# Patient Record
Sex: Female | Born: 1950 | Race: White | Hispanic: No | Marital: Married | Smoking: Former smoker
Health system: Southern US, Community
[De-identification: ages and names within clinical notes are randomized; demographics above are authoritative.]

## PROBLEM LIST (undated history)

## (undated) DIAGNOSIS — M1990 Unspecified osteoarthritis, unspecified site: Secondary | ICD-10-CM

## (undated) DIAGNOSIS — I214 Non-ST elevation (NSTEMI) myocardial infarction: Secondary | ICD-10-CM

## (undated) DIAGNOSIS — Z8619 Personal history of other infectious and parasitic diseases: Secondary | ICD-10-CM

## (undated) DIAGNOSIS — I951 Orthostatic hypotension: Secondary | ICD-10-CM

## (undated) DIAGNOSIS — I5181 Takotsubo syndrome: Secondary | ICD-10-CM

## (undated) DIAGNOSIS — A310 Pulmonary mycobacterial infection: Secondary | ICD-10-CM

## (undated) DIAGNOSIS — R768 Other specified abnormal immunological findings in serum: Secondary | ICD-10-CM

## (undated) DIAGNOSIS — J0181 Other acute recurrent sinusitis: Secondary | ICD-10-CM

## (undated) DIAGNOSIS — R55 Syncope and collapse: Secondary | ICD-10-CM

## (undated) DIAGNOSIS — M8580 Other specified disorders of bone density and structure, unspecified site: Secondary | ICD-10-CM

## (undated) DIAGNOSIS — I252 Old myocardial infarction: Secondary | ICD-10-CM

## (undated) DIAGNOSIS — H9190 Unspecified hearing loss, unspecified ear: Secondary | ICD-10-CM

## (undated) DIAGNOSIS — R05 Cough: Secondary | ICD-10-CM

## (undated) DIAGNOSIS — N390 Urinary tract infection, site not specified: Secondary | ICD-10-CM

## (undated) DIAGNOSIS — M069 Rheumatoid arthritis, unspecified: Secondary | ICD-10-CM

## (undated) DIAGNOSIS — I493 Ventricular premature depolarization: Secondary | ICD-10-CM

## (undated) DIAGNOSIS — I509 Heart failure, unspecified: Secondary | ICD-10-CM

## (undated) DIAGNOSIS — J439 Emphysema, unspecified: Secondary | ICD-10-CM

## (undated) DIAGNOSIS — I10 Essential (primary) hypertension: Secondary | ICD-10-CM

## (undated) DIAGNOSIS — J479 Bronchiectasis, uncomplicated: Secondary | ICD-10-CM

## (undated) DIAGNOSIS — R0982 Postnasal drip: Secondary | ICD-10-CM

## (undated) DIAGNOSIS — R918 Other nonspecific abnormal finding of lung field: Secondary | ICD-10-CM

## (undated) DIAGNOSIS — F419 Anxiety disorder, unspecified: Secondary | ICD-10-CM

## (undated) HISTORY — DX: Takotsubo syndrome: I51.81

## (undated) HISTORY — DX: Syncope and collapse: R55

## (undated) HISTORY — DX: Orthostatic hypotension: I95.1

## (undated) HISTORY — DX: Other specified disorders of bone density and structure, unspecified site: M85.80

## (undated) HISTORY — PX: OTHER SURGICAL HISTORY: SHX169

## (undated) HISTORY — DX: Bronchiectasis, uncomplicated: J47.9

## (undated) HISTORY — DX: Unspecified osteoarthritis, unspecified site: M19.90

## (undated) HISTORY — DX: Anxiety disorder, unspecified: F41.9

## (undated) HISTORY — DX: Cough: R05

## (undated) HISTORY — PX: LUMBAR SPINE SURGERY: SHX701

## (undated) HISTORY — DX: Rheumatoid arthritis, unspecified: M06.9

## (undated) HISTORY — DX: Unspecified hearing loss, unspecified ear: H91.90

## (undated) HISTORY — PX: TONSILLECTOMY AND ADENOIDECTOMY: SUR1326

## (undated) HISTORY — DX: Other specified abnormal immunological findings in serum: R76.8

## (undated) HISTORY — DX: Old myocardial infarction: I25.2

## (undated) HISTORY — DX: Heart failure, unspecified: I50.9

## (undated) HISTORY — DX: Other acute recurrent sinusitis: J01.81

## (undated) HISTORY — DX: Non-ST elevation (NSTEMI) myocardial infarction: I21.4

## (undated) HISTORY — DX: Urinary tract infection, site not specified: N39.0

## (undated) HISTORY — DX: Postnasal drip: R09.82

## (undated) HISTORY — DX: Ventricular premature depolarization: I49.3

## (undated) HISTORY — DX: Pulmonary mycobacterial infection: A31.0

## (undated) HISTORY — DX: Other nonspecific abnormal finding of lung field: R91.8

## (undated) HISTORY — DX: Essential (primary) hypertension: I10

## (undated) HISTORY — DX: Personal history of other infectious and parasitic diseases: Z86.19

## (undated) HISTORY — PX: CARDIAC CATHETERIZATION: SHX172

## (undated) HISTORY — DX: Emphysema, unspecified: J43.9

---

## 2003-03-30 ENCOUNTER — Ambulatory Visit (HOSPITAL_COMMUNITY): Admission: RE | Admit: 2003-03-30 | Discharge: 2003-03-31 | Payer: Self-pay | Admitting: Neurosurgery

## 2014-02-26 DIAGNOSIS — I214 Non-ST elevation (NSTEMI) myocardial infarction: Secondary | ICD-10-CM

## 2014-02-26 HISTORY — DX: Non-ST elevation (NSTEMI) myocardial infarction: I21.4

## 2014-03-08 ENCOUNTER — Telehealth: Payer: Self-pay | Admitting: Critical Care Medicine

## 2014-03-08 NOTE — Telephone Encounter (Signed)
Spoke with the pt and rescheduled her consult for the [place] office 04/10/14 at 9 am  Pt aware to bring meds and arrive @ 8:45 am  Pt aware of [place] office location

## 2014-03-08 NOTE — Telephone Encounter (Signed)
Crystal and PW please advise if you are willing to see this patient in [place] office sooner instead of patient having to go [place] office later. Pt is aware that I am having to get approval first. Thanks.

## 2014-03-08 NOTE — Telephone Encounter (Signed)
Ok to work in either [employer] office or [place] sooner

## 2014-04-07 ENCOUNTER — Encounter: Payer: Self-pay | Admitting: Critical Care Medicine

## 2014-04-10 ENCOUNTER — Encounter: Payer: Self-pay | Admitting: Critical Care Medicine

## 2014-04-10 ENCOUNTER — Ambulatory Visit (INDEPENDENT_AMBULATORY_CARE_PROVIDER_SITE_OTHER): Payer: PRIVATE HEALTH INSURANCE | Admitting: Critical Care Medicine

## 2014-04-10 VITALS — BP 128/76 | HR 76 | Temp 97.1°F | Ht 64.0 in | Wt 107.6 lb

## 2014-04-10 DIAGNOSIS — I10 Essential (primary) hypertension: Secondary | ICD-10-CM

## 2014-04-10 DIAGNOSIS — I5181 Takotsubo syndrome: Secondary | ICD-10-CM

## 2014-04-10 DIAGNOSIS — M069 Rheumatoid arthritis, unspecified: Secondary | ICD-10-CM

## 2014-04-10 DIAGNOSIS — I252 Old myocardial infarction: Secondary | ICD-10-CM

## 2014-04-10 DIAGNOSIS — R918 Other nonspecific abnormal finding of lung field: Secondary | ICD-10-CM

## 2014-04-10 NOTE — Progress Notes (Signed)
Subjective:    Patient ID: Erin Black, female    DOB: 29-Jun-1950, 63 y.o.   MRN: [id number]  HPI Comments: Chronic cough, 6 months.  Cough is prod mucus, yellow to green mucus.  No real chest pain.  AMI one month ago.  Ex smoker since 2000.  No prior PNA.  No hemoptysis.  CT Chest : neg for PE and nodules on R lung.  Cough This is a new problem. The current episode started more than 1 month ago. The problem has been waxing and waning. The cough is productive of sputum. Associated symptoms include nasal congestion, postnasal drip, rhinorrhea and sweats. Pertinent negatives include no chest pain, chills, ear congestion, ear pain, fever, headaches, heartburn, hemoptysis, rash, sore throat, shortness of breath, weight loss or wheezing. Risk factors for lung disease include smoking/tobacco exposure. Her past medical history is significant for bronchitis. There is no history of asthma, bronchiectasis, COPD, emphysema, environmental allergies or pneumonia.    Past Medical History  Diagnosis Date  . UTI (lower urinary tract infection)   . RA (rheumatoid arthritis)   . DJD (degenerative joint disease)   . PVC's (premature ventricular contractions)   . Osteopenia   . Hearing loss   . Takotsubo cardiomyopathy   . NSTEMI (non-ST elevated myocardial infarction)   . Essential hypertension   . Anxiety disorder   . Congestive heart failure      Family History  Problem Relation Age of Onset  . Emphysema Mother   . Heart disease Mother   . Prostate cancer Father   . Lung cancer Sister      History   Social History  . Marital Status: Divorced    Spouse Name: N/A    Number of Children: N/A  . Years of Education: N/A   Occupational History  . RN [employer]   Social History Main Topics  . Smoking status: Former Smoker -- 1.00 packs/day for 25 years    Types: Cigarettes    Quit date: 04/28/1998  . Smokeless tobacco: Never Used  . Alcohol Use: 0.0 oz/week    0 Not specified  per week     Comment: 1 drink weekly  . Drug Use: No  . Sexual Activity: Not on file   Other Topics Concern  . Not on file   Social History Narrative     Allergies  Allergen Reactions  . Yellow Jacket Venom [Bee Venom] Anaphylaxis    Hives/swelling  . Plavix [Clopidogrel Bisulfate]     rash     Outpatient Prescriptions Prior to Visit  Medication Sig Dispense Refill  . aspirin 81 MG tablet Take 81 mg by mouth daily.    Marland Kitchen atorvastatin (LIPITOR) 80 MG tablet Take 80 mg by mouth daily.    . clonazePAM (KLONOPIN) 0.5 MG tablet Take 0.5 mg by mouth at bedtime.     Marland Kitchen EPINEPHrine (EPIPEN 2-PAK) 0.3 mg/0.3 mL IJ SOAJ injection Inject into the muscle once.    . folic acid (FOLVITE) 1 MG tablet Take 1 mg by mouth daily as needed.     Marland Kitchen lisinopril (PRINIVIL,ZESTRIL) 5 MG tablet Take 5 mg by mouth 2 (two) times daily.     . methotrexate 25 MG/ML SOLN once a week.    . metoprolol succinate (TOPROL-XL) 50 MG 24 hr tablet Take 50 mg by mouth as directed. Take with or immediately following a meal.    . nitroGLYCERIN (NITROSTAT) 0.4 MG SL tablet Place 0.4 mg under the tongue every 5 (  five) minutes as needed for chest pain.    Marland Kitchen sertraline (ZOLOFT) 100 MG tablet Take 100 mg by mouth daily.    Marland Kitchen tiZANidine (ZANAFLEX) 4 MG tablet Take 4 mg by mouth every 8 (eight) hours as needed for muscle spasms.    . clopidogrel (PLAVIX) 75 MG tablet Take 75 mg by mouth daily.    . ranitidine (ZANTAC) 150 MG tablet Take 150 mg by mouth at bedtime.      No facility-administered medications prior to visit.      Review of Systems  Constitutional: Negative for fever, chills and weight loss.  HENT: Positive for postnasal drip, rhinorrhea, sinus pressure and sneezing. Negative for ear pain, sore throat, trouble swallowing and voice change.   Respiratory: Positive for cough and chest tightness. Negative for hemoptysis, shortness of breath and wheezing.   Cardiovascular: Negative for chest pain.  Gastrointestinal:  Negative for heartburn.  Skin: Negative for rash.  Allergic/Immunologic: Negative for environmental allergies.  Neurological: Negative for headaches.       Objective:   Physical Exam Filed Vitals:   04/10/14 0906  BP: 128/76  Pulse: 76  Temp: 97.1 F (36.2 C)  TempSrc: Oral  Height: 5\' 4"  (1.626 m)  Weight: 107 lb 9.6 oz (48.807 kg)  SpO2: 100%    Gen: Pleasant, well-nourished, in no distress,  normal affect  ENT: No lesions,  mouth clear,  oropharynx clear, no postnasal drip  Neck: No JVD, no TMG, no carotid bruits  Lungs: No use of accessory muscles, no dullness to percussion, clear without rales or rhonchi  Cardiovascular: RRR, heart sounds normal, no murmur or gallops, no peripheral edema  Abdomen: soft and NT, no HSM,  BS normal  Musculoskeletal: No deformities, no cyanosis or clubbing  Neuro: alert, non focal  Skin: Warm, no lesions or rashes  No results found.  CT Scan of chest reviewed from 02/2014:  No PE, scattered nodules throughout R lung.  Poss CA vs inflammatory process         Assessment & Plan:   Abnormal CT scan of lung RML and RLL nodular infiltrates probable inflammatory, Hx of RA on immunosuppression  ??MAI vs other pathogen. Associated chronic cough Discussed with Dr 03/2014 of Cardiology,  Given proximity to AMI, will need to stay on Brilenta into January , then can hold for Fob in January  Plan Bronchoscopy will be performed at 730AM on 05/05/13 Stop Brilenta two days prior to bronchscopy, stay on aspirin  Nothing by mouth after midnight night before No change in medications Return in January in [place]    Updated Medication List Outpatient Encounter Prescriptions as of 04/10/2014  Medication Sig  . aspirin 81 MG tablet Take 81 mg by mouth daily.  04/12/2014 atorvastatin (LIPITOR) 80 MG tablet Take 80 mg by mouth daily.  Marland Kitchen BRILINTA 90 MG TABS tablet Take 90 mg by mouth daily.  . clonazePAM (KLONOPIN) 0.5 MG tablet Take 0.5 mg by mouth at  bedtime.   Marland Kitchen EPINEPHrine (EPIPEN 2-PAK) 0.3 mg/0.3 mL IJ SOAJ injection Inject into the muscle once.  . folic acid (FOLVITE) 1 MG tablet Take 1 mg by mouth daily as needed.   Marland Kitchen lisinopril (PRINIVIL,ZESTRIL) 5 MG tablet Take 5 mg by mouth 2 (two) times daily.   . methotrexate 25 MG/ML SOLN once a week.  . metoprolol succinate (TOPROL-XL) 50 MG 24 hr tablet Take 50 mg by mouth as directed. Take with or immediately following a meal.  . nitroGLYCERIN (NITROSTAT) 0.4 MG  SL tablet Place 0.4 mg under the tongue every 5 (five) minutes as needed for chest pain.  Marland Kitchen sertraline (ZOLOFT) 100 MG tablet Take 100 mg by mouth daily.  Marland Kitchen tiZANidine (ZANAFLEX) 4 MG tablet Take 4 mg by mouth every 8 (eight) hours as needed for muscle spasms.  . [DISCONTINUED] clopidogrel (PLAVIX) 75 MG tablet Take 75 mg by mouth daily.  . [DISCONTINUED] ranitidine (ZANTAC) 150 MG tablet Take 150 mg by mouth at bedtime.

## 2014-04-10 NOTE — Patient Instructions (Addendum)
Bronchoscopy will be performed at 730AM on 05/05/13 Stop Brilenta two days prior to bronchscopy, stay on aspirin  Nothing by mouth after midnight night before No change in medications Return in January in [place] We will follow up results

## 2014-04-11 ENCOUNTER — Encounter (HOSPITAL_COMMUNITY): Payer: Self-pay | Admitting: Emergency Medicine

## 2014-04-11 ENCOUNTER — Other Ambulatory Visit: Payer: Self-pay

## 2014-04-11 ENCOUNTER — Emergency Department (HOSPITAL_COMMUNITY)
Admission: EM | Admit: 2014-04-11 | Discharge: 2014-04-11 | Disposition: A | Discharge status: Home / Self Care | Payer: PRIVATE HEALTH INSURANCE | Attending: Emergency Medicine | Admitting: Emergency Medicine

## 2014-04-11 DIAGNOSIS — Z87440 Personal history of urinary (tract) infections: Secondary | ICD-10-CM | POA: Diagnosis not present

## 2014-04-11 DIAGNOSIS — H918X9 Other specified hearing loss, unspecified ear: Secondary | ICD-10-CM | POA: Diagnosis not present

## 2014-04-11 DIAGNOSIS — F419 Anxiety disorder, unspecified: Secondary | ICD-10-CM | POA: Insufficient documentation

## 2014-04-11 DIAGNOSIS — Z7982 Long term (current) use of aspirin: Secondary | ICD-10-CM | POA: Diagnosis not present

## 2014-04-11 DIAGNOSIS — I10 Essential (primary) hypertension: Secondary | ICD-10-CM | POA: Insufficient documentation

## 2014-04-11 DIAGNOSIS — I214 Non-ST elevation (NSTEMI) myocardial infarction: Secondary | ICD-10-CM | POA: Diagnosis not present

## 2014-04-11 DIAGNOSIS — A310 Pulmonary mycobacterial infection: Secondary | ICD-10-CM | POA: Insufficient documentation

## 2014-04-11 DIAGNOSIS — I5181 Takotsubo syndrome: Secondary | ICD-10-CM | POA: Insufficient documentation

## 2014-04-11 DIAGNOSIS — M069 Rheumatoid arthritis, unspecified: Secondary | ICD-10-CM | POA: Insufficient documentation

## 2014-04-11 DIAGNOSIS — I252 Old myocardial infarction: Secondary | ICD-10-CM

## 2014-04-11 DIAGNOSIS — Z79899 Other long term (current) drug therapy: Secondary | ICD-10-CM | POA: Diagnosis not present

## 2014-04-11 DIAGNOSIS — R55 Syncope and collapse: Secondary | ICD-10-CM | POA: Diagnosis present

## 2014-04-11 DIAGNOSIS — I509 Heart failure, unspecified: Secondary | ICD-10-CM | POA: Insufficient documentation

## 2014-04-11 DIAGNOSIS — Z87891 Personal history of nicotine dependence: Secondary | ICD-10-CM | POA: Insufficient documentation

## 2014-04-11 HISTORY — DX: Old myocardial infarction: I25.2

## 2014-04-11 HISTORY — DX: Pulmonary mycobacterial infection: A31.0

## 2014-04-11 NOTE — ED Notes (Addendum)
EMS - Patient is being treated for syncopy related to orthostatic hypotension.  Patient coming from family medicine for a check on arthritis and they checked her BP of 72/38.  EMS vital signs 116/57, HR88, 98% 2L.  Post MI 1 month ago at [facility].  Patient on metoprolol, feels "dosage is too high".  Patient is wearing an external monitor for her heart.

## 2014-04-11 NOTE — ED Provider Notes (Signed)
CSN: [id number]     Arrival date & time 04/11/14  1422 History   First MD Initiated Contact with Patient 04/11/14 1527     Chief Complaint  Patient presents with  . 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 She plans to start cardiac rehab in near future. She is pending bronchoscopy for pulmonary lesions in January. Furthermore, patient has been having intermittent syncopal episodes over the past 1 year. She sees cardiologist Dr. Dortha Kern in [place]. She has been worked up with stress test, tilt table test, echo. She is currently wearing a Holter monitor and was doing so today with her symptoms. Patient also states that she held her metoprolol for the past 3 days, but took one half of her normal dose last night after her blood pressure was elevated into the 180s systolic.  Patient was in [place] today for a routine physician visit for a check of her rheumatoid arthritis. She was having her typical syncopal prodrome of lightheadedness, seeing spots in her vision, feeling like her arms are heavy. Blood pressure was checked and found to be 72/38. Patient was then sent to the emergency department for further evaluation. Patient states that she has all these symptoms with these episodes in the past. No new problems today. Patient did not have dry heaves, chest tightness that she had with "MI" one month ago. She did not have chest pain or shortness of breath. She did not have full syncope.  She states that she now feels back to baseline.   Patient is a 63 y.o. female presenting with near-syncope. The history is provided by the patient and medical records.  Near Syncope Pertinent negatives  include no abdominal pain, chest pain, coughing, diaphoresis, fever, nausea, neck pain, rash or vomiting.    Past Medical History  Diagnosis Date  . UTI (lower urinary tract infection)   . RA (rheumatoid arthritis)   . DJD (degenerative joint disease)   . PVC's (premature ventricular contractions)   . Osteopenia   . Hearing loss   . Takotsubo cardiomyopathy   . NSTEMI (non-ST elevated myocardial infarction)   . Essential hypertension   . Anxiety disorder   . Congestive heart failure    Past Surgical History  Procedure Laterality Date  . Tonsillectomy and adenoidectomy    . Bilateral tubal ligation    . Strabismus repair    . Lumbar spine surgery     Family History  Problem Relation Age of Onset  . Emphysema Mother   . Heart disease Mother   . Prostate cancer Father   . Lung cancer Sister    History  Substance Use Topics  . Smoking status: Former Smoker -- 1.00 packs/day for 25 years    Types: Cigarettes    Quit date: 04/28/1998  . Smokeless tobacco: Never Used  . Alcohol Use: 0.0 oz/week    0 Not specified per week     Comment: 1 drink weekly   OB History    No data available     Review of Systems  Constitutional: Negative for fever and diaphoresis.  Eyes: Positive for visual disturbance. Negative for redness.  Respiratory: Negative for cough and shortness of breath.   Cardiovascular: Positive for near-syncope. Negative for chest pain, palpitations and leg swelling.  Gastrointestinal: Negative for nausea, vomiting and abdominal pain.  Genitourinary: Negative for dysuria.  Musculoskeletal: Negative for back pain and neck pain.  Skin: Negative for rash.  Neurological: Positive for light-headedness. Negative for syncope.      Allergies  Yellow jacket venom and Plavix  Home Medications   Prior to Admission medications   Medication Sig Start Date End Date Taking? Authorizing Provider  aspirin 81 MG tablet Take 81 mg by mouth daily.   Yes Historical  Provider, MD  atorvastatin (LIPITOR) 80 MG tablet Take 80 mg by mouth daily.   Yes Historical Provider, MD  BRILINTA 90 MG TABS tablet Take 90 mg by mouth daily.   Yes Historical Provider, MD  clonazePAM (KLONOPIN) 0.5 MG tablet Take 0.5 mg by mouth at bedtime.    Yes Historical Provider, MD  folic acid (FOLVITE) 1 MG tablet Take 1 mg by mouth daily as needed.    Yes Historical Provider, MD  lisinopril (PRINIVIL,ZESTRIL) 5 MG tablet Take 5 mg by mouth 2 (two) times daily.    Yes Historical Provider, MD  methotrexate 25 MG/ML SOLN Inject 20 mg into the muscle once a week. Wednesday   Yes Historical Provider, MD  metoprolol succinate (TOPROL-XL) 50 MG 24 hr tablet Take 25 mg by mouth daily as needed. Take with or immediately following a meal.   Yes Historical Provider, MD  sertraline (ZOLOFT) 100 MG tablet Take 100 mg by mouth daily.   Yes Historical Provider, MD  tiZANidine (ZANAFLEX) 4 MG tablet Take 4 mg by mouth every 8 (eight) hours as needed for muscle spasms.   Yes Historical Provider, MD  EPINEPHrine (EPIPEN 2-PAK) 0.3 mg/0.3 mL IJ SOAJ injection Inject 0.3 mg into the muscle once.     Historical Provider, MD  nitroGLYCERIN (NITROSTAT) 0.4 MG SL tablet Place 0.4 mg under the tongue every 5 (five) minutes as needed for chest pain.    Historical Provider, MD   BP 103/52 mmHg  Pulse 70  Temp(Src) 97.8 F (36.6 C) (Oral)  Resp 18  Ht 5\' 4"  (1.626 m)  Wt 107 lb (48.535 kg)  BMI 18.36 kg/m2  SpO2 100%   Physical Exam  Constitutional: She appears well-developed and well-nourished.  HENT:  Head: Normocephalic and atraumatic.  Mouth/Throat: Oropharynx is clear and moist and mucous membranes are normal. Mucous membranes are not dry.  Eyes: Conjunctivae are normal. Right eye exhibits no discharge. Left eye exhibits no discharge.  Neck: Trachea normal and normal range of motion. Neck supple. Normal carotid pulses and no JVD present. No muscular tenderness present. Carotid bruit is not present.  No tracheal deviation present.  Cardiovascular: Normal rate, regular rhythm, S1 normal, S2 normal, normal heart sounds and intact distal pulses.  Exam reveals no decreased pulses.   No murmur heard. Pulmonary/Chest: Effort normal and breath sounds normal. No respiratory distress. She has no wheezes. She exhibits no tenderness.  Abdominal: Soft. Normal aorta and bowel sounds are normal. There is no tenderness. There is no rebound and no guarding.  Musculoskeletal: Normal range of motion.  Neurological: She is alert.  Skin: Skin is warm and dry. She is not diaphoretic. No cyanosis. No pallor.  Psychiatric: She has a normal mood and affect.  Nursing note and vitals reviewed.   ED Course  Procedures (including critical care time) Labs Review Labs Reviewed - No data to display  Imaging Review No results found.   EKG Interpretation None       3:47 PM Patient seen and  examined. Will check orthostatics and ambulate. Patient does not want blood work. We discussed benefits of labs. She refuses. We discussed EKG findings.   Patient was discussed with Dr. Clarene [place].   Vital signs reviewed and are as follows: BP 103/52 mmHg  Pulse 70  Temp(Src) 97.8 F (36.6 C) (Oral)  Resp 18  Ht 5\' 4"  (1.626 m)  Wt 107 lb (48.535 kg)  BMI 18.36 kg/m2  SpO2 100%  4:37 PM Patient ambulated without problems. Her BP dropped somewhat with standing but she states she felt just very slightly lightheaded, 'nothing like I felt earlier'. She wants to go home and does not want any further evaluation.   She agrees to contact her cardiologist for follow-up appointment.   We discussed that she needs to seek immediate medical attention if she has recurrent symptoms, severe chest pain, especially if the pain is crushing or pressure-like and spreads to the arms, back, neck, or jaw, or if they have sweating, nausea, or shortness of breath with the pain. They were encouraged to call 911 with these symptoms.   They were  also told to return if their chest pain gets worse and does not go away with rest, they have an attack of chest pain lasting longer than usual despite rest and treatment with the medications their caregiver has prescribed, if they wake from sleep with chest pain or shortness of breath, if they feel dizzy or faint, if they have chest pain not typical of their usual pain, or if they have any other emergent concerns regarding their health.  The patient verbalized understanding and agreed.     MDM   Final diagnoses:  Near syncope   Patient with previous episodes of hypotension and syncope/near syncope which she has been worked up for over the past year. She had MI/Takosubo one month ago, but those presenting symptoms were different. Her symptoms today are the same as she has had with previous episodes. She would not have sought medical attention if she were not at her primary care physician office. Patient's EKG was unremarkable. No hard signs of heart failure. She does not have chest pain or shortness of breath. She did not have full syncope.  Offered more extensive work-up but patient refuses. She will not agree to admission. 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 She has good cardiology follow-up and agrees to follow-up. Patient is ready for discharge home. She is back to her baseline in emergency department. Appropriate return instructions discussed with patient and her husband at bedside. She seems reliable to return with worsening or new symptoms.    , PA-C 04/11/14 1643  04/13/14, DO 04/14/14 1356

## 2014-04-11 NOTE — Assessment & Plan Note (Signed)
RML and RLL nodular infiltrates probable inflammatory, Hx of RA on immunosuppression  ??MAI vs other pathogen. Associated chronic cough Discussed with Dr Dulce Sellar of Cardiology,  Given proximity to AMI, will need to stay on Brilenta into January , then can hold for Fob in January  Plan Bronchoscopy will be performed at 730AM on 05/05/13 Stop Brilenta two days prior to bronchscopy, stay on aspirin  Nothing by mouth after midnight night before No change in medications Return in January in [place]

## 2014-04-11 NOTE — Discharge Instructions (Signed)
Please read and follow all provided instructions.  Your diagnoses today include:  1. Near syncope    Tests performed today include:  An EKG of your heart  Orthostatic vital signs  Vital signs. See below for your results today.   Medications prescribed:   None  Take any prescribed medications only as directed.  Follow-up instructions: Please follow-up with your primary care provider and cardiologist as soon as you can for further evaluation of your symptoms.   Please hold your metoprolol until directed to take by your doctor.   Return instructions:  SEEK IMMEDIATE MEDICAL ATTENTION IF:  You have severe chest pain, especially if the pain is crushing or pressure-like and spreads to the arms, back, neck, or jaw, or if you have sweating, nausea (feeling sick to your stomach), or shortness of breath. THIS IS AN EMERGENCY. Don't wait to see if the pain will go away. Get medical help at once. Call 911 or 0 (operator). DO NOT drive yourself to the hospital.   Your chest pain gets worse and does not go away with rest.   You have an attack of chest pain lasting longer than usual, despite rest and treatment with the medications your caregiver has prescribed.   You wake from sleep with chest pain or shortness of breath.  You feel dizzy or faint.  You have chest pain not typical of your usual pain for which you originally saw your caregiver.   You have any other emergent concerns regarding your health.   Your vital signs today were: BP 103/52 mmHg   Pulse 70   Temp(Src) 97.8 F (36.6 C) (Oral)   Resp 18   Ht 5\' 4"  (1.626 m)   Wt 107 lb (48.535 kg)   BMI 18.36 kg/m2   SpO2 100% If your blood pressure (BP) was elevated above 135/85 this visit, please have this repeated by your doctor within one month. --------------

## 2014-04-11 NOTE — Addendum Note (Signed)
Addended by: Shan Levans E on: 04/11/2014 10:37 AM   Modules accepted: Orders

## 2014-04-11 NOTE — ED Notes (Signed)
PA at bedside.

## 2014-04-11 NOTE — ED Notes (Signed)
Pt ambulated to restroom, nad.

## 2014-04-11 NOTE — ED Notes (Signed)
Pt tolerated orthostatics well, no c/o dizziness or lightheadedness while standing or moving.

## 2014-05-04 ENCOUNTER — Telehealth: Payer: Self-pay | Admitting: Critical Care Medicine

## 2014-05-04 NOTE — Telephone Encounter (Signed)
i confirmed this cancellation  Please schedule 730AM at [facility] on 05/17/14.  Wednesday  Pt aware this is the date we will schedule

## 2014-05-04 NOTE — Telephone Encounter (Signed)
Called and spoke to pt. Pt has canceled the bronch for 05/05/14 d/t insurance issues. Pt stated she will be able to have the bronch on the week of the 18th.  Dr. Delford Field please advise further.

## 2014-05-05 ENCOUNTER — Inpatient Hospital Stay (HOSPITAL_COMMUNITY): Admission: RE | Admit: 2014-05-05 | Payer: PRIVATE HEALTH INSURANCE | Source: Ambulatory Visit

## 2014-05-05 ENCOUNTER — Other Ambulatory Visit: Payer: Self-pay | Admitting: Critical Care Medicine

## 2014-05-05 ENCOUNTER — Encounter (HOSPITAL_COMMUNITY): Admission: RE | Payer: Self-pay | Source: Ambulatory Visit

## 2014-05-05 DIAGNOSIS — J479 Bronchiectasis, uncomplicated: Secondary | ICD-10-CM

## 2014-05-05 SURGERY — BRONCHOSCOPY, WITH FLUOROSCOPY
Anesthesia: Moderate Sedation

## 2014-05-05 SURGERY — BRONCHOSCOPY, WITH FLUOROSCOPY
Anesthesia: Moderate Sedation | Laterality: Bilateral

## 2014-05-05 NOTE — Telephone Encounter (Signed)
Thank you lindsay

## 2014-05-05 NOTE — Telephone Encounter (Signed)
Bronch has been scheduled for 05/17/14 at [facility] at 7:30am.

## 2014-05-08 ENCOUNTER — Ambulatory Visit (HOSPITAL_COMMUNITY)
Admission: RE | Admit: 2014-05-08 | Payer: PRIVATE HEALTH INSURANCE | Source: Ambulatory Visit | Admitting: Critical Care Medicine

## 2014-05-16 ENCOUNTER — Ambulatory Visit: Payer: Self-pay | Admitting: Critical Care Medicine

## 2014-05-16 ENCOUNTER — Ambulatory Visit: Payer: PRIVATE HEALTH INSURANCE | Admitting: Critical Care Medicine

## 2014-05-17 ENCOUNTER — Encounter (HOSPITAL_COMMUNITY)
Admission: RE | Disposition: A | Discharge status: Home / Self Care | Payer: Self-pay | Source: Ambulatory Visit | Attending: Critical Care Medicine

## 2014-05-17 ENCOUNTER — Encounter (HOSPITAL_COMMUNITY): Payer: Self-pay | Admitting: Critical Care Medicine

## 2014-05-17 ENCOUNTER — Ambulatory Visit (HOSPITAL_COMMUNITY)
Admission: RE | Admit: 2014-05-17 | Discharge: 2014-05-17 | Disposition: A | Discharge status: Home / Self Care | Payer: 59 | Source: Ambulatory Visit | Attending: Critical Care Medicine | Admitting: Critical Care Medicine

## 2014-05-17 DIAGNOSIS — R911 Solitary pulmonary nodule: Secondary | ICD-10-CM | POA: Diagnosis present

## 2014-05-17 DIAGNOSIS — M069 Rheumatoid arthritis, unspecified: Secondary | ICD-10-CM | POA: Diagnosis not present

## 2014-05-17 DIAGNOSIS — J479 Bronchiectasis, uncomplicated: Secondary | ICD-10-CM

## 2014-05-17 HISTORY — PX: VIDEO BRONCHOSCOPY: SHX5072

## 2014-05-17 SURGERY — VIDEO BRONCHOSCOPY WITHOUT FLUORO
Anesthesia: Moderate Sedation | Laterality: Bilateral

## 2014-05-17 MED ORDER — SODIUM CHLORIDE 0.9 % IV SOLN
Freq: Once | INTRAVENOUS | Status: AC
  Administered 2014-05-17: 07:00:00 via INTRAVENOUS

## 2014-05-17 MED ORDER — PHENYLEPHRINE HCL 0.25 % NA SOLN: 1.0000 | Freq: Four times a day (QID) | NASAL | Status: DC | PRN

## 2014-05-17 MED ORDER — FENTANYL CITRATE 0.05 MG/ML IJ SOLN
INTRAMUSCULAR | Status: DC | PRN
  Administered 2014-05-17: 20 ug via INTRAVENOUS
  Administered 2014-05-17: 30 ug via INTRAVENOUS

## 2014-05-17 MED ORDER — MIDAZOLAM HCL 10 MG/2ML IJ SOLN
INTRAMUSCULAR | Status: DC | PRN
  Administered 2014-05-17: 3 mg via INTRAVENOUS
  Administered 2014-05-17: 2 mg via INTRAVENOUS

## 2014-05-17 MED ORDER — LIDOCAINE HCL 1 % IJ SOLN
INTRAMUSCULAR | Status: DC | PRN
  Administered 2014-05-17: 6 mL via RESPIRATORY_TRACT

## 2014-05-17 MED ORDER — SODIUM CHLORIDE 0.9 % IV SOLN
INTRAVENOUS | Status: DC
  Administered 2014-05-17: 07:00:00 via INTRAVENOUS

## 2014-05-17 MED ORDER — LIDOCAINE HCL 2 % EX GEL
CUTANEOUS | Status: DC | PRN
  Administered 2014-05-17: 1

## 2014-05-17 MED ORDER — MIDAZOLAM HCL 10 MG/2ML IJ SOLN
INTRAMUSCULAR | Status: AC
  Filled 2014-05-17: qty 4

## 2014-05-17 MED ORDER — FENTANYL CITRATE 0.05 MG/ML IJ SOLN
INTRAMUSCULAR | Status: AC
  Filled 2014-05-17: qty 4

## 2014-05-17 MED ORDER — BUTAMBEN-TETRACAINE-BENZOCAINE 2-2-14 % EX AERO: 1.0000 | INHALATION_SPRAY | Freq: Once | CUTANEOUS | Status: DC

## 2014-05-17 MED ORDER — LIDOCAINE HCL 2 % EX GEL: Freq: Once | CUTANEOUS | Status: DC

## 2014-05-17 MED ORDER — PHENYLEPHRINE HCL 0.25 % NA SOLN
NASAL | Status: DC | PRN
  Administered 2014-05-17: 1 via NASAL

## 2014-05-17 NOTE — Discharge Instructions (Signed)
Flexible Bronchoscopy, Care After These instructions give you information on caring for yourself after your procedure. Your doctor may also give you more specific instructions. Call your doctor if you have any problems or questions after your procedure. HOME CARE  Do not eat or drink anything for 2 hours after your procedure. If you try to eat or drink before the medicine wears off, food or drink could go into your lungs. You could also burn yourself.  After 2 hours have passed and when you can cough and gag normally, you may eat soft food and drink liquids slowly.  The day after the test, you may eat your normal diet.  You may do your normal activities.  Keep all doctor visits. GET HELP RIGHT AWAY IF:  You get more and more short of breath.  You get light-headed.  You feel like you are going to pass out (faint).  You have chest pain.  You have new problems that worry you.  You cough up more than a little blood.  You cough up more blood than before. MAKE SURE YOU:  Understand these instructions.  Will watch your condition.  Will get help right away if you are not doing well or get worse. Document Released: 02/09/2009 Document Revised: 04/19/2013 Document Reviewed: 12/17/2012 [facility] Patient Information 2015 [place], [place]. This information is not intended to replace advice given to you by your health care provider. Make sure you discuss any questions you have with your health care provider.  Nothing to eat or drink until 09:45 am today 05/17/2014.

## 2014-05-17 NOTE — Progress Notes (Signed)
Video bronchoscopy performed Intervention bronchial washings Pt tolerated well  [place], Junita Push RRT  Agree with above  Shan Levans

## 2014-05-17 NOTE — Op Note (Signed)
Bronchoscopy Procedure Note  Date of Operation: 05/17/2014  Pre-op Diagnosis: nodular tree in bud pattern in RML RLL  Post-op Diagnosis: Same  Surgeon: Shan Levans  Anesthesia: Monitored Local Anesthesia with Sedation:    Versed 5mg  IVP;  Fentanyl 50 mcg IVP  Operation: Flexible fiberoptic bronchoscopy, diagnostic   Findings: Totally normal airway  Specimen: BAL RML   Estimated Blood Loss: none  Complications: none  Indications and History: The patient is a 64 y.o. female with RA, nodules RML RLL ?MAI.  The risks, benefits, complications, treatment options and expected outcomes were discussed with the patient.  The possibilities of reaction to medication, pulmonary aspiration, perforation of a viscus, bleeding, failure to diagnose a condition and creating a complication requiring transfusion or operation were discussed with the patient who freely signed the consent.    Description of Procedure: The patient was re-examined in the bronchoscopy suite and the site of surgery properly noted/marked.  The patient was identified as [id number] and the procedure verified as Flexible Fiberoptic Bronchoscopy.  A Time Out was held and the above information confirmed.   After the induction of topical nasopharyngeal anesthesia, the patient was positioned  and the bronchoscope was passed through the R nares. The vocal cords were visualized and  1% buffered lidocaine 5 ml was topically placed onto the cords. The cords were normal. The scope was then passed into the trachea.  1% buffered lidocaine 5 ml was used topically on the carina.  Careful inspection of the tracheal lumen was accomplished. The scope was sequentially passed into the left main and then left upper and lower bronchi and segmental bronchi.     BAL RML 120cc infused, 60cc returned  was done and there was one specimen.   The scope was then withdrawn and advanced into the right main bronchus and then into the RUL, RML, and RLL  bronchi and segmental bronchi.    Endobronchial findings: None Trachea: Normal mucosa Carina: Normal mucosa Right main bronchus: Normal mucosa Right upper lobe bronchus: Normal mucosa Right middle lobe bronchus: Normal mucosa Right lower lobe bronchus: Normal mucosa Left main bronchus: Normal mucosa Left upper lobe bronchus: Normal mucosa Left lower lobe bronchus: Normal mucosa  The Patient was taken to the Endoscopy Recovery area in satisfactory condition.  Attestation: I performed the procedure.  Ilda Mori MD  05/17/2014 7:47 AM

## 2014-05-17 NOTE — H&P (Signed)
  PCCM  H and P reviewed.  Pt reexamined.  There have been no interval changes. The pt is ready for FOB with BAL and poss Bx.  Off Brilinta since end of Dec 2015  Time out performed .  Consent completed  Shan Levans

## 2014-05-18 ENCOUNTER — Encounter (HOSPITAL_COMMUNITY): Payer: Self-pay | Admitting: Critical Care Medicine

## 2014-05-20 LAB — CULTURE, BAL-QUANTITATIVE

## 2014-05-20 LAB — CULTURE, BAL-QUANTITATIVE W GRAM STAIN

## 2014-05-29 ENCOUNTER — Telehealth: Payer: Self-pay | Admitting: Critical Care Medicine

## 2014-05-29 NOTE — Telephone Encounter (Signed)
Called and spoke to pt. Pt stated she received a letter dated for 05/12/14 stated pt's bronch was denied by [facility]. Pt stated she was informed that the procedure was approved.   Libby please advise.

## 2014-05-31 ENCOUNTER — Telehealth: Payer: Self-pay | Admitting: Critical Care Medicine

## 2014-05-31 MED ORDER — RIFAMPIN 300 MG PO CAPS: 600.0000 mg | ORAL_CAPSULE | ORAL | Status: DC

## 2014-05-31 MED ORDER — ETHAMBUTOL HCL 400 MG PO TABS: 800.0000 mg | ORAL_TABLET | ORAL | Status: DC

## 2014-05-31 MED ORDER — AZITHROMYCIN 250 MG PO TABS: 500.0000 mg | ORAL_TABLET | ORAL | Status: DC

## 2014-05-31 NOTE — Telephone Encounter (Signed)
I called pt and told her FOB pos MAI.   She needs Rx I called in rifampin/myambutol/azithromycin thrice weekly Pt to keep next OV

## 2014-06-01 NOTE — Telephone Encounter (Signed)
She is not contagious.  She can be around grandchildren.  Expect minimum 6 months Rx maybe 9 months. Will f/u cxr

## 2014-06-01 NOTE — Telephone Encounter (Signed)
Calling back, [phone]

## 2014-06-01 NOTE — Telephone Encounter (Signed)
lmtcb for pt to inform her Almyra Free will not be back in office till Monday to review issue.

## 2014-06-01 NOTE — Telephone Encounter (Signed)
Spoke with pt-- aware of all below, spoke with PW last night. Nothing further needed.

## 2014-06-01 NOTE — Telephone Encounter (Signed)
After message closed, pt returned with additional questions.  1) Am I contagious? 2) Can I be around my Grandchildren? 61 1/65 year old and 76mo old girls 3) How long will I be on treatment for MAI (3 abx)?  Please advise Dr Delford Field. Thanks.

## 2014-06-01 NOTE — Telephone Encounter (Signed)
Pt aware again that insurance issue will be checked on next week.  Will send to [facility] to follow up on.

## 2014-06-01 NOTE — Telephone Encounter (Signed)
Pt is aware. Nothing further needed 

## 2014-06-01 NOTE — Telephone Encounter (Signed)
lmomtb x1 

## 2014-06-01 NOTE — Telephone Encounter (Signed)
Pt aware she will get a call next week.

## 2014-06-05 NOTE — Telephone Encounter (Signed)
Spoke to pt she is waiting to hear back from [facility] because they say she did not have a referral from her prim care dr she will call back if she needs to [facility]

## 2014-06-13 ENCOUNTER — Encounter: Payer: Self-pay | Admitting: Critical Care Medicine

## 2014-06-13 ENCOUNTER — Ambulatory Visit (INDEPENDENT_AMBULATORY_CARE_PROVIDER_SITE_OTHER): Payer: Self-pay | Admitting: Critical Care Medicine

## 2014-06-13 VITALS — BP 130/80 | HR 84 | Temp 96.8°F | Ht 64.0 in | Wt 109.6 lb

## 2014-06-13 DIAGNOSIS — J479 Bronchiectasis, uncomplicated: Secondary | ICD-10-CM

## 2014-06-13 DIAGNOSIS — T50905S Adverse effect of unspecified drugs, medicaments and biological substances, sequela: Secondary | ICD-10-CM

## 2014-06-13 DIAGNOSIS — A310 Pulmonary mycobacterial infection: Secondary | ICD-10-CM

## 2014-06-13 NOTE — Assessment & Plan Note (Signed)
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

## 2014-06-13 NOTE — Assessment & Plan Note (Signed)
History of bronchiectasis with Mycobacterium avium infection stable at this time Plan No inhaled medications indicated

## 2014-06-13 NOTE — Progress Notes (Signed)
Subjective:    Patient ID: Erin Black, female    DOB: 1951-03-22, 64 y.o.   MRN: [id number]  HPI Comments: Chronic cough, 6 months.  Cough is prod mucus, yellow to green mucus.  No real chest pain.  AMI one month ago.  Ex smoker since 2000.  No prior PNA.  No hemoptysis.  CT Chest : neg for PE and nodules on R lung.  06/13/2014 Chief Complaint  Patient presents with  . Follow-up    c/o cough-dry,keeps up at hs,cough when talking and cold air a lot,runny nose-clear constantly,sob -same,occass. wheezing,chest tightness and pain occass.,no fcs  Fob done, showed MAI.  Hx of MTX use for RA, asked Rheum to stop MTX.  Pt on three drug Rx for MAI thrice weekly: Azithromycin/Rif/myambutol Pt is now off MTX.  Hand symptoms may be worse off MTX.  Strength in hands worse.  ? Biologic ?   Cough is now dry, no yellow green.  Mucus is clear,  No blood. BAL: MAI.  Pt notes no nausea,except on the day of rx.  No GI symptoms.         Past Medical History  Diagnosis Date  . UTI (lower urinary tract infection)   . RA (rheumatoid arthritis)   . DJD (degenerative joint disease)   . PVC's (premature ventricular contractions)   . Osteopenia   . Hearing loss   . Takotsubo cardiomyopathy   . NSTEMI (non-ST elevated myocardial infarction)   . Essential hypertension   . Anxiety disorder   . Congestive heart failure      Family History  Problem Relation Age of Onset  . Emphysema Mother   . Heart disease Mother   . Prostate cancer Father   . Lung cancer Sister      History   Social History  . Marital Status: Married    Spouse Name: N/A  . Number of Children: N/A  . Years of Education: N/A   Occupational History  . RN [employer]   Social History Main Topics  . Smoking status: Former Smoker -- 1.00 packs/day for 25 years    Types: Cigarettes    Quit date: 04/28/1998  . Smokeless tobacco: Never Used  . Alcohol Use: 0.0 oz/week    0 Standard drinks or equivalent per week   Comment: 1 drink weekly  . Drug Use: No  . Sexual Activity: Not on file   Other Topics Concern  . Not on file   Social History Narrative     Allergies  Allergen Reactions  . Yellow Jacket Venom [Bee Venom] Anaphylaxis    Hives/swelling  . Plavix [Clopidogrel Bisulfate]     rash     Outpatient Prescriptions Prior to Visit  Medication Sig Dispense Refill  . aspirin 81 MG tablet Take 81 mg by mouth daily.    Marland Kitchen atorvastatin (LIPITOR) 80 MG tablet Take 80 mg by mouth daily.    Marland Kitchen azithromycin (ZITHROMAX) 250 MG tablet Take 2 tablets (500 mg total) by mouth 3 (three) times a week. 30 tablet 6  . clonazePAM (KLONOPIN) 0.5 MG tablet Take 0.5 mg by mouth at bedtime.     Marland Kitchen EPINEPHrine (EPIPEN 2-PAK) 0.3 mg/0.3 mL IJ SOAJ injection Inject 0.3 mg into the muscle once.     . ethambutol (MYAMBUTOL) 400 MG tablet Take 2 tablets (800 mg total) by mouth 3 (three) times a week. 30 tablet 6  . nitroGLYCERIN (NITROSTAT) 0.4 MG SL tablet Place 0.4 mg under the tongue every  5 (five) minutes as needed for chest pain.    Marland Kitchen tiZANidine (ZANAFLEX) 4 MG tablet Take 4 mg by mouth every 8 (eight) hours as needed for muscle spasms.    . folic acid (FOLVITE) 1 MG tablet Take 1 mg by mouth daily as needed.     Marland Kitchen lisinopril (PRINIVIL,ZESTRIL) 5 MG tablet Take 5 mg by mouth 2 (two) times daily.     . methotrexate 25 MG/ML SOLN Inject 20 mg into the muscle once a week. Wednesday    . metoprolol succinate (TOPROL-XL) 50 MG 24 hr tablet Take 25 mg by mouth daily as needed. Take with or immediately following a meal.    . sertraline (ZOLOFT) 100 MG tablet Take 100 mg by mouth daily.     No facility-administered medications prior to visit.      Review of Systems  HENT: Positive for sinus pressure and sneezing. Negative for trouble swallowing and voice change.   Respiratory: Positive for chest tightness.        Objective:   Physical Exam Filed Vitals:   06/13/14 1113  BP: 130/80  Pulse: 84  Temp: 96.8 F (36  C)  TempSrc: Oral  Height: 5\' 4"  (1.626 m)  Weight: 109 lb 9.6 oz (49.714 kg)  SpO2: 97%    Gen: Pleasant, well-nourished, in no distress,  normal affect  ENT: No lesions,  mouth clear,  oropharynx clear, no postnasal drip  Neck: No JVD, no TMG, no carotid bruits  Lungs: No use of accessory muscles, no dullness to percussion, clear without rales or rhonchi  Cardiovascular: RRR, heart sounds normal, no murmur or gallops, no peripheral edema  Abdomen: soft and NT, no HSM,  BS normal  Musculoskeletal: No deformities, no cyanosis or clubbing  Neuro: alert, non focal  Skin: Warm, no lesions or rashes  No results found.  CT Scan of chest reviewed from 02/2014:  No PE, scattered nodules throughout R lung.  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 aspirin 81 MG tablet Take 81 mg by mouth daily.  06/15/2014 atorvastatin (LIPITOR) 80 MG tablet Take 80 mg by mouth daily.  Marland Kitchen azithromycin (ZITHROMAX) 250 MG tablet Take 2 tablets (500 mg total) by mouth 3 (three) times a week.  . clonazePAM (KLONOPIN) 0.5 MG tablet Take 0.5 mg by mouth at  bedtime.   Marland Kitchen EPINEPHrine (EPIPEN 2-PAK) 0.3 mg/0.3 mL IJ SOAJ injection Inject 0.3 mg into the muscle once.   . ethambutol (MYAMBUTOL) 400 MG tablet Take 2 tablets (800 mg total) by mouth 3 (three) times a week.  . metoprolol tartrate (LOPRESSOR) 25 MG tablet Take 25 mg by mouth at bedtime.   . nitroGLYCERIN (NITROSTAT) 0.4 MG SL tablet  Place 0.4 mg under the tongue every 5 (five) minutes as needed for chest pain.  . rifampin (RIFADIN) 150 MG capsule 150 mg. Take four capsules by mouth three times a week  . tiZANidine (ZANAFLEX) 4 MG tablet Take 4 mg by mouth every 8 (eight) hours as needed for muscle spasms.  . folic acid (FOLVITE) 1 MG tablet Take 1 mg by mouth daily as needed.   . [DISCONTINUED] clopidogrel (PLAVIX) 75 MG tablet Take 75 mg by mouth daily.  . [DISCONTINUED] lisinopril (PRINIVIL,ZESTRIL) 5 MG tablet Take 5 mg by mouth 2 (two) times daily.   . [DISCONTINUED] methotrexate 25 MG/ML SOLN Inject 20 mg into the muscle once a week. Wednesday  . [DISCONTINUED] metoprolol succinate (TOPROL-XL) 50 MG 24 hr tablet Take 25 mg by mouth daily as needed. Take with or immediately following a meal.  . [DISCONTINUED] sertraline (ZOLOFT) 100 MG tablet Take 100 mg by mouth daily.

## 2014-06-13 NOTE — Patient Instructions (Signed)
Obtain liver function tests Get eye doctor or optomitrist to check color vision in eyes No change in medications  Use simple saline for nose You have mycobacterium avium intracellulare lung infection, you are NOT contagious Return 2 months

## 2014-06-15 ENCOUNTER — Telehealth: Payer: Self-pay | Admitting: Critical Care Medicine

## 2014-06-15 DIAGNOSIS — A310 Pulmonary mycobacterial infection: Secondary | ICD-10-CM

## 2014-06-15 NOTE — Telephone Encounter (Signed)
Pt returned call

## 2014-06-15 NOTE — Telephone Encounter (Signed)
Spoke with pt.  Discussed results per Dr. Wright.  She verbalized understanding and voiced no further questions or concerns at this time. 

## 2014-06-15 NOTE — Telephone Encounter (Signed)
lmomtcb for pt on home and cell #s 

## 2014-06-15 NOTE — Telephone Encounter (Signed)
Liver function test normal , let pt know

## 2014-06-16 LAB — FUNGUS CULTURE W SMEAR: FUNGAL SMEAR: NONE SEEN

## 2014-06-29 LAB — AFB CULTURE WITH SMEAR (NOT AT ARMC): Acid Fast Smear: NONE SEEN

## 2014-08-15 ENCOUNTER — Encounter: Payer: Self-pay | Admitting: Critical Care Medicine

## 2014-08-15 ENCOUNTER — Ambulatory Visit (INDEPENDENT_AMBULATORY_CARE_PROVIDER_SITE_OTHER): Payer: Self-pay | Admitting: Critical Care Medicine

## 2014-08-15 VITALS — BP 110/68 | HR 68 | Temp 96.4°F | Ht 64.0 in | Wt 106.4 lb

## 2014-08-15 DIAGNOSIS — A310 Pulmonary mycobacterial infection: Secondary | ICD-10-CM

## 2014-08-15 DIAGNOSIS — T50905S Adverse effect of unspecified drugs, medicaments and biological substances, sequela: Secondary | ICD-10-CM

## 2014-08-15 DIAGNOSIS — J479 Bronchiectasis, uncomplicated: Secondary | ICD-10-CM

## 2014-08-15 NOTE — Patient Instructions (Addendum)
Probiotic one daily:  Erin Black makes one that is good Liver function tests will be ordered No change in medications Return 3 months

## 2014-08-15 NOTE — Progress Notes (Signed)
Subjective:    Patient ID: Erin Black, female    DOB: 08-06-1950, 64 y.o.   MRN: [id number]  HPI Comments: Chronic cough, 6 months.  Cough is prod mucus, yellow to green mucus.  No real chest pain.  AMI one month ago.  Ex smoker since 2000.  No prior PNA.  No hemoptysis.  CT Chest : neg for PE and nodules on R lung.    08/15/2014 Chief Complaint  Patient presents with  . Follow-up    c/o allergies,ears stopped up,runny nose-clear, occass. cough-dry,sneezing,mild sob with exertion,denies cp or tightness.Eye ck good report.  Pt doing well. occ mild diarrhea.  No change in dyspnea.  Pt denies any significant sore throat, nasal congestion or excess secretions, fever, chills, sweats, unintended weight loss, pleurtic or exertional chest pain, orthopnea PND, or leg swelling Pt denies any increase in rescue therapy over baseline, denies waking up needing it or having any early am or nocturnal exacerbations of coughing/wheezing/or dyspnea. Pt also denies any obvious fluctuation in symptoms with  weather or environmental change or other alleviating or aggravating factors    Past Medical History  Diagnosis Date  . UTI (lower urinary tract infection)   . RA (rheumatoid arthritis)   . DJD (degenerative joint disease)   . PVC's (premature ventricular contractions)   . Osteopenia   . Hearing loss   . Takotsubo cardiomyopathy   . NSTEMI (non-ST elevated myocardial infarction)   . Essential hypertension   . Anxiety disorder   . Congestive heart failure      Family History  Problem Relation Age of Onset  . Emphysema Mother   . Heart disease Mother   . Prostate cancer Father   . Lung cancer Sister      History   Social History  . Marital Status: Married    Spouse Name: N/A  . Number of Children: N/A  . Years of Education: N/A   Occupational History  . RN [employer]   Social History Main Topics  . Smoking status: Former Smoker -- 1.00 packs/day for 25 years    Types:  Cigarettes    Quit date: 04/28/1998  . Smokeless tobacco: Never Used  . Alcohol Use: 0.0 oz/week    0 Standard drinks or equivalent per week     Comment: 1 drink weekly  . Drug Use: No  . Sexual Activity: Not on file   Other Topics Concern  . Not on file   Social History Narrative     Allergies  Allergen Reactions  . Yellow Jacket Venom [Bee Venom] Anaphylaxis    Hives/swelling  . Plavix [Clopidogrel Bisulfate]     rash     Outpatient Prescriptions Prior to Visit  Medication Sig Dispense Refill  . aspirin 81 MG tablet Take 81 mg by mouth daily.    Marland Kitchen atorvastatin (LIPITOR) 80 MG tablet Take 80 mg by mouth daily.    Marland Kitchen azithromycin (ZITHROMAX) 250 MG tablet Take 2 tablets (500 mg total) by mouth 3 (three) times a week. 30 tablet 6  . clonazePAM (KLONOPIN) 0.5 MG tablet Take 0.5 mg by mouth at bedtime.     Marland Kitchen EPINEPHrine (EPIPEN 2-PAK) 0.3 mg/0.3 mL IJ SOAJ injection Inject 0.3 mg into the muscle once.     . ethambutol (MYAMBUTOL) 400 MG tablet Take 2 tablets (800 mg total) by mouth 3 (three) times a week. 30 tablet 6  . metoprolol tartrate (LOPRESSOR) 25 MG tablet Take 25 mg by mouth. Take 1/2 tablet to  1 tablet depending on BP at bedtime by mouth.  6  . rifampin (RIFADIN) 150 MG capsule 150 mg. Take four capsules by mouth three times a week  6  . tiZANidine (ZANAFLEX) 4 MG tablet Take 4 mg by mouth every 8 (eight) hours as needed for muscle spasms.    . nitroGLYCERIN (NITROSTAT) 0.4 MG SL tablet Place 0.4 mg under the tongue every 5 (five) minutes as needed for chest pain.    . folic acid (FOLVITE) 1 MG tablet Take 1 mg by mouth daily as needed.      No facility-administered medications prior to visit.      Review of Systems  HENT: Positive for sinus pressure and sneezing. Negative for trouble swallowing and voice change.   Respiratory: Positive for chest tightness.        Objective:   Physical Exam Filed Vitals:   08/15/14 0847  BP: 110/68  Pulse: 68  Temp: 96.4 F  (35.8 C)  TempSrc: Oral  Height: 5\' 4"  (1.626 m)  Weight: 106 lb 6.4 oz (48.263 kg)  SpO2: 98%    Gen: Pleasant, well-nourished, in no distress,  normal affect  ENT: No lesions,  mouth clear,  oropharynx clear, no postnasal drip  Neck: No JVD, no TMG, no carotid bruits  Lungs: No use of accessory muscles, no dullness to percussion, clear without rales or rhonchi  Cardiovascular: RRR, heart sounds normal, no murmur or gallops, no peripheral edema  Abdomen: soft and NT, no HSM,  BS normal  Musculoskeletal: No deformities, no cyanosis or clubbing  Neuro: alert, non focal  Skin: Warm, no lesions or rashes  No results found.  CT Scan of chest reviewed from 02/2014:  No PE, scattered nodules throughout R lung.  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 Plan Probiotic one daily Liver function tests will be ordered No change in medications, cont thrice weekly rifampin/ETH/azithromycin  Return 3 months      Updated Medication List Outpatient Encounter Prescriptions as of 08/15/2014  Medication Sig  . aspirin 81 MG tablet Take 81 mg by mouth daily.  08/17/2014 atorvastatin (LIPITOR) 80 MG tablet Take 80 mg by mouth daily.  Marland Kitchen azithromycin (ZITHROMAX) 250 MG tablet Take 2 tablets (500 mg total) by mouth 3 (three) times a week.  . clonazePAM (KLONOPIN) 0.5 MG tablet Take 0.5 mg by mouth at bedtime.   Marland Kitchen EPINEPHrine (EPIPEN 2-PAK) 0.3 mg/0.3 mL IJ SOAJ injection Inject 0.3 mg into the muscle once.   . ethambutol (MYAMBUTOL) 400 MG tablet Take 2 tablets (800 mg total) by mouth 3 (three) times a week.  . metoprolol tartrate (LOPRESSOR) 25 MG tablet Take 25 mg by mouth. Take 1/2 tablet to 1 tablet depending on BP at bedtime by mouth.  . rifampin (RIFADIN) 150 MG capsule 150 mg. Take four capsules by mouth  three times a week  . tiZANidine (ZANAFLEX) 4 MG tablet Take 4 mg by mouth every 8 (eight) hours as needed for muscle spasms.  . nitroGLYCERIN (NITROSTAT) 0.4 MG SL tablet Place 0.4 mg under the tongue every 5 (five) minutes as needed for chest pain.  . [DISCONTINUED] folic acid (FOLVITE) 1 MG tablet Take 1 mg by mouth daily as needed.

## 2014-08-16 NOTE — Assessment & Plan Note (Signed)
MAI under Rx with bronchiectasis, now stable. Plan Probiotic one daily Liver function tests will be ordered No change in medications, cont thrice weekly rifampin/ETH/azithromycin  Return 3 months

## 2014-08-25 ENCOUNTER — Telehealth: Payer: Self-pay | Admitting: Critical Care Medicine

## 2014-08-25 NOTE — Telephone Encounter (Signed)
Pt cb, please cb at either numbers listed

## 2014-08-25 NOTE — Telephone Encounter (Signed)
Called and spoke to pt. Informed her of the results per PW. Recall placed for 3 months. Pt verbalized understanding and denied any further questions or concerns at this time.

## 2014-08-25 NOTE — Telephone Encounter (Signed)
Pt had LFTs on 08/22/14 at [facility].  Lab ordered by Dr. Delford Field. Per Dr. Delford Field:  LFTs normal. lmomtcb for pt on home and cell #s to advise of results.  Results placed in scan folder.

## 2014-08-25 NOTE — Telephone Encounter (Signed)
agree

## 2014-09-12 ENCOUNTER — Encounter: Payer: Self-pay | Admitting: Critical Care Medicine

## 2014-10-31 DIAGNOSIS — I951 Orthostatic hypotension: Secondary | ICD-10-CM

## 2014-10-31 DIAGNOSIS — R55 Syncope and collapse: Secondary | ICD-10-CM

## 2014-10-31 DIAGNOSIS — I5181 Takotsubo syndrome: Secondary | ICD-10-CM

## 2014-10-31 HISTORY — DX: Syncope and collapse: R55

## 2014-10-31 HISTORY — DX: Takotsubo syndrome: I51.81

## 2014-10-31 HISTORY — DX: Orthostatic hypotension: I95.1

## 2014-11-16 ENCOUNTER — Ambulatory Visit: Payer: 59 | Admitting: Critical Care Medicine

## 2014-11-21 ENCOUNTER — Encounter: Payer: Self-pay | Admitting: Critical Care Medicine

## 2014-11-21 ENCOUNTER — Ambulatory Visit (INDEPENDENT_AMBULATORY_CARE_PROVIDER_SITE_OTHER): Payer: 59 | Admitting: Critical Care Medicine

## 2014-11-21 VITALS — BP 114/76 | HR 82 | Temp 99.5°F | Ht 64.0 in | Wt 101.8 lb

## 2014-11-21 DIAGNOSIS — A310 Pulmonary mycobacterial infection: Secondary | ICD-10-CM

## 2014-11-21 NOTE — Patient Instructions (Signed)
Liver function panel today, copy of results to Dr Dulce Sellar No change in medications, plan stop meds in November 2016 Return October in [place] for follow up

## 2014-11-21 NOTE — Progress Notes (Signed)
Subjective:    Patient ID: Erin Black, female    DOB: Jul 15, 1950, 64 y.o.   MRN: [id number]  HPI 11/21/2014 Chief Complaint  Patient presents with  . Follow-up    Feeling good,sob-same,dry hacky cough occass.,no wheezing,denies cp or tightness, no fcs    Dyspnea ok.  No real wheeze. No chest pain.   Pt denies any significant sore throat, nasal congestion or excess secretions, fever, chills, sweats, unintended weight loss, pleurtic or exertional chest pain, orthopnea PND, or leg swelling Pt denies any increase in rescue therapy over baseline, denies waking up needing it or having any early am or nocturnal exacerbations of coughing/wheezing/or dyspnea. Pt also denies any obvious fluctuation in symptoms with  weather or environmental change or other alleviating or aggravating factors   Current Medications, Allergies, Complete Past Medical History, Past Surgical History, Family History, and Social History were reviewed in [employer] electronic medical record per todays encounter:  11/21/2014     Review of Systems  Constitutional: Negative.   HENT: Negative.  Negative for ear pain, postnasal drip, rhinorrhea, sinus pressure, sore throat, trouble swallowing and voice change.   Eyes: Negative.   Respiratory: Negative.  Negative for apnea, cough, choking, chest tightness, shortness of breath, wheezing and stridor.   Cardiovascular: Negative.  Negative for chest pain, palpitations and leg swelling.  Gastrointestinal: Negative.  Negative for nausea, vomiting, abdominal pain and abdominal distention.  Genitourinary: Negative.   Musculoskeletal: Negative.  Negative for myalgias and arthralgias.  Skin: Negative.  Negative for rash.  Allergic/Immunologic: Negative.  Negative for environmental allergies and food allergies.  Neurological: Negative.  Negative for dizziness, syncope, weakness and headaches.  Hematological: Negative.  Negative for adenopathy. Does not bruise/bleed easily.    Psychiatric/Behavioral: Negative.  Negative for sleep disturbance and agitation. The patient is not nervous/anxious.        Objective:   Physical Exam Filed Vitals:   11/21/14 0903  BP: 114/76  Pulse: 82  Temp: 99.5 F (37.5 C)  TempSrc: Oral  Height: 5\' 4"  (1.626 m)  Weight: 101 lb 12.8 oz (46.176 kg)  SpO2: 97%    Gen: Pleasant, well-nourished, in no distress,  normal affect  ENT: No lesions,  mouth clear,  oropharynx clear, no postnasal drip  Neck: No JVD, no TMG, no carotid bruits  Lungs: No use of accessory muscles, no dullness to percussion, clear without rales or rhonchi  Cardiovascular: RRR, heart sounds normal, no murmur or gallops, no peripheral edema  Abdomen: soft and NT, no HSM,  BS normal  Musculoskeletal: No deformities, no cyanosis or clubbing  Neuro: alert, non focal  Skin: Warm, no lesions or rashes  No results found.        Assessment & Plan:  I personally reviewed all images and lab data in the [facility] system as well as any outside material available during this office visit and agree with the  radiology impressions.   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  Diagnoses and all orders for this visit:  MAI (mycobacterium avium-intracellulare) Orders: -     Hepatic function panel  MAI (mycobacterium avium-intracellulare) infection: RML RLL  nodular infiltrates ; Pos FOB cultures    I had an extended discussion with the patient and or family lasting 10 minutes of a 25 minute visit including:  Dx, tx  options, duration of Rx

## 2014-11-21 NOTE — Assessment & Plan Note (Signed)
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

## 2014-11-23 ENCOUNTER — Telehealth: Payer: Self-pay | Admitting: Critical Care Medicine

## 2014-11-23 NOTE — Telephone Encounter (Signed)
Pt had LFTs done at [facility] on 11/21/14 - ordered by Dr. Delford Field. LFTs WNL. Called, spoke with pt.  Discussed lab results.  She verbalized understanding. Results faxed to Dr. Hulen Shouts office and placed in scan folder.

## 2014-11-28 NOTE — Telephone Encounter (Signed)
Noted  

## 2014-11-28 NOTE — Telephone Encounter (Signed)
Will sign off and route to PW as FYI.

## 2014-12-11 ENCOUNTER — Other Ambulatory Visit: Payer: Self-pay | Admitting: Critical Care Medicine

## 2014-12-14 DIAGNOSIS — A310 Pulmonary mycobacterial infection: Secondary | ICD-10-CM | POA: Diagnosis not present

## 2014-12-14 DIAGNOSIS — Z79899 Other long term (current) drug therapy: Secondary | ICD-10-CM | POA: Diagnosis not present

## 2014-12-14 DIAGNOSIS — M069 Rheumatoid arthritis, unspecified: Secondary | ICD-10-CM | POA: Diagnosis not present

## 2014-12-14 DIAGNOSIS — E785 Hyperlipidemia, unspecified: Secondary | ICD-10-CM | POA: Diagnosis not present

## 2014-12-14 DIAGNOSIS — I5181 Takotsubo syndrome: Secondary | ICD-10-CM | POA: Diagnosis not present

## 2014-12-14 DIAGNOSIS — I493 Ventricular premature depolarization: Secondary | ICD-10-CM | POA: Diagnosis not present

## 2014-12-19 DIAGNOSIS — H25813 Combined forms of age-related cataract, bilateral: Secondary | ICD-10-CM | POA: Diagnosis not present

## 2015-01-17 DIAGNOSIS — Z23 Encounter for immunization: Secondary | ICD-10-CM | POA: Diagnosis not present

## 2015-02-19 ENCOUNTER — Ambulatory Visit (INDEPENDENT_AMBULATORY_CARE_PROVIDER_SITE_OTHER): Payer: Medicare Other | Admitting: Internal Medicine

## 2015-02-19 ENCOUNTER — Other Ambulatory Visit (INDEPENDENT_AMBULATORY_CARE_PROVIDER_SITE_OTHER): Payer: Medicare Other

## 2015-02-19 VITALS — BP 134/78 | HR 94 | Ht 64.0 in | Wt 104.8 lb

## 2015-02-19 DIAGNOSIS — M1990 Unspecified osteoarthritis, unspecified site: Secondary | ICD-10-CM | POA: Diagnosis not present

## 2015-02-19 DIAGNOSIS — Z23 Encounter for immunization: Secondary | ICD-10-CM | POA: Diagnosis not present

## 2015-02-19 DIAGNOSIS — A310 Pulmonary mycobacterial infection: Secondary | ICD-10-CM

## 2015-02-19 DIAGNOSIS — M0579 Rheumatoid arthritis with rheumatoid factor of multiple sites without organ or systems involvement: Secondary | ICD-10-CM | POA: Diagnosis not present

## 2015-02-19 HISTORY — DX: Pulmonary mycobacterial infection: A31.0

## 2015-02-19 LAB — CBC
HCT: 43.1 % (ref 36.0–46.0)
Hemoglobin: 14.7 g/dL (ref 12.0–15.0)
MCHC: 34.1 g/dL (ref 30.0–36.0)
MCV: 100.6 fl — ABNORMAL HIGH (ref 78.0–100.0)
PLATELETS: 200 10*3/uL (ref 150.0–400.0)
RBC: 4.28 Mil/uL (ref 3.87–5.11)
RDW: 12.9 % (ref 11.5–15.5)
WBC: 5 10*3/uL (ref 4.0–10.5)

## 2015-02-19 LAB — BASIC METABOLIC PANEL
BUN: 11 mg/dL (ref 6–23)
CHLORIDE: 100 meq/L (ref 96–112)
CO2: 31 meq/L (ref 19–32)
Calcium: 10 mg/dL (ref 8.4–10.5)
Creatinine, Ser: 0.93 mg/dL (ref 0.40–1.20)
GFR: 64.27 mL/min (ref >60.00)
Glucose, Bld: 122 mg/dL — ABNORMAL HIGH (ref 70–99)
Potassium: 4.1 mEq/L (ref 3.5–5.1)
SODIUM: 139 meq/L (ref 135–145)

## 2015-02-19 LAB — HEPATIC FUNCTION PANEL
ALBUMIN: 4.2 g/dL (ref 3.5–5.2)
ALK PHOS: 60 U/L (ref 39–117)
ALT: 14 U/L (ref 0–35)
AST: 23 U/L (ref 0–37)
Bilirubin, Direct: 0.1 mg/dL (ref 0.0–0.3)
Total Bilirubin: 0.3 mg/dL (ref 0.2–1.2)
Total Protein: 7.1 g/dL (ref 6.0–8.3)

## 2015-02-19 NOTE — Progress Notes (Signed)
Subjective:    Patient ID: Erin Black, female    DOB: 01/19/1951, 64 y.o.   MRN: [id number]  HPI   OV 02/19/2015  Chief Complaint  Patient presents with  . Follow-up    Pt c/o sneezing, PND, and productive cough x 2 months. Pt states taking claritin without relief. No other complaints voiced    Follow-up patient of Dr. Shan Levans. Being followed for Micah Bactrim avium complex. According to the patient this was diagnosed incidental finding with bronchiectasis" last year 2015. In January 2016 according to chart review and the patient underwent bronchoscopy with lavage and confirmed to have MAI. Since then committed to 3 drug therapy. Symptoms at that time was only dyspnea with very mild cough. Reports that Dr. Delford Field is informed of the exam improved with 3 drug therapy. In addition patient's dyspnea is also improved. According to review of the charts she was supposed to finish treatment in 9 months which would be November 2016. Currently she is feeling well and is asking about regular monitoring and also completion of therapy. She reports that she's tolerating 3 drugs quite well without any side effects. Her urine is amber colored. Reports that liver function tests always been normal  Off note she has rheumatoid arthritis and was on methotrexate prior to the diagnosis of MAI infection. At that time liver function tests abnormal he did after stopping methotrexate liver function tests have always been normal. Methotrexate was stopped following the diagnosis of MAI infection  Review of images  -  I could not pull up the film CT chest presumably this was done at [facility] and we do not have access to the PACS system without as having an image here.   has a past medical history of UTI (lower urinary tract infection); RA (rheumatoid arthritis); DJD (degenerative joint disease); PVC's (premature ventricular contractions); Osteopenia; Hearing loss; Takotsubo cardiomyopathy; NSTEMI  (non-ST elevated myocardial infarction); Essential hypertension; Anxiety disorder; and Congestive heart failure.   reports that she quit smoking about 16 years ago. Her smoking use included Cigarettes. She has a 25 pack-year smoking history. She has never used smokeless tobacco.  Past Surgical History  Procedure Laterality Date  . Tonsillectomy and adenoidectomy    . Bilateral tubal ligation    . Strabismus repair    . Lumbar spine surgery    . Video bronchoscopy Bilateral 05/17/2014    Procedure: VIDEO BRONCHOSCOPY WITHOUT FLUORO;  Surgeon: Storm Frisk, MD;  Location: [facility]COPY;  Service: Cardiopulmonary;  Laterality: Bilateral;    Allergies  Allergen Reactions  . Yellow Jacket Venom [Bee Venom] Anaphylaxis    Hives/swelling  . Plavix [Clopidogrel Bisulfate]     rash    Immunization History  Administered Date(s) Administered  . Influenza-Unspecified 01/26/2014, 01/16/2015    Family History  Problem Relation Age of Onset  . Emphysema Mother   . Heart disease Mother   . Prostate cancer Father   . Lung cancer Sister      Current outpatient prescriptions:  .  aspirin 81 MG tablet, Take 81 mg by mouth daily., Disp: , Rfl:  .  atorvastatin (LIPITOR) 80 MG tablet, Take 80 mg by mouth. Take 1/2 tablet daily by mouth., Disp: , Rfl:  .  azithromycin (ZITHROMAX) 250 MG tablet, TAKE TWO TABLETS BY MOUTH THREE TIMES A WEEK, Disp: 30 tablet, Rfl: 2 .  clonazePAM (KLONOPIN) 0.5 MG tablet, Take 0.5 mg by mouth daily. , Disp: , Rfl:  .  EPINEPHrine (EPIPEN 2-PAK) 0.3  mg/0.3 mL IJ SOAJ injection, Inject 0.3 mg into the muscle as needed. , Disp: , Rfl:  .  ethambutol (MYAMBUTOL) 400 MG tablet, TAKE TWO TABLETS BY MOUTH THREE TIMES A WEEK, Disp: 30 tablet, Rfl: 2 .  metoprolol tartrate (LOPRESSOR) 25 MG tablet, Take 25 mg by mouth. Take 1/2 tablet to 1 tablet depending on BP at bedtime by mouth., Disp: , Rfl: 6 .  nitroGLYCERIN (NITROSTAT) 0.4 MG SL tablet, Place 0.4 mg under the  tongue every 5 (five) minutes as needed for chest pain., Disp: , Rfl:  .  rifampin (RIFADIN) 150 MG capsule, 150 mg. Take four capsules by mouth three times a week, Disp: , Rfl: 6 .  tiZANidine (ZANAFLEX) 4 MG tablet, Take 4 mg by mouth every 8 (eight) hours as needed for muscle spasms., Disp: , Rfl:      Review of Systems  Constitutional: Negative for fever, chills and unexpected weight change.  HENT: Positive for postnasal drip, rhinorrhea, sinus pressure, sneezing and sore throat. Negative for congestion, dental problem, ear pain, nosebleeds, trouble swallowing and voice change.   Eyes: Negative for visual disturbance.  Respiratory: Positive for cough. Negative for choking and shortness of breath.   Cardiovascular: Negative for chest pain and leg swelling.  Gastrointestinal: Negative for vomiting, abdominal pain and diarrhea.  Genitourinary: Negative for difficulty urinating.  Musculoskeletal: Negative for arthralgias.  Skin: Negative for rash.  Neurological: Positive for headaches. Negative for tremors and syncope.  Hematological: Does not bruise/bleed easily.       Objective:   Physical Exam  Constitutional: She is oriented to person, place, and time. She appears well-developed and well-nourished. No distress.  HENT:  Head: Normocephalic and atraumatic.  Right Ear: External ear normal.  Left Ear: External ear normal.  Mouth/Throat: Oropharynx is clear and moist. No oropharyngeal exudate.  Eyes: Conjunctivae and EOM are normal. Pupils are equal, round, and reactive to light. Right eye exhibits no discharge. Left eye exhibits no discharge. No scleral icterus.  Neck: Normal range of motion. Neck supple. No JVD present. No tracheal deviation present. No thyromegaly present.  Cardiovascular: Normal rate, regular rhythm, normal heart sounds and intact distal pulses.  Exam reveals no gallop and no friction rub.   No murmur heard. Pulmonary/Chest: Effort normal and breath sounds  normal. No respiratory distress. She has no wheezes. She has no rales. She exhibits no tenderness.  Possible right lower lobe crackles  Abdominal: Soft. Bowel sounds are normal. She exhibits no distension and no mass. There is no tenderness. There is no rebound and no guarding.  Musculoskeletal: Normal range of motion. She exhibits no edema or tenderness.  Lymphadenopathy:    She has no cervical adenopathy.  Neurological: She is alert and oriented to person, place, and time. She has normal reflexes. No cranial nerve deficit. She exhibits normal muscle tone. Coordination normal.  Skin: Skin is warm and dry. No rash noted. She is not diaphoretic. No erythema. No pallor.  Psychiatric: She has a normal mood and affect. Her behavior is normal. Judgment and thought content normal.  Vitals reviewed.   Filed Vitals:   02/19/15 1416  BP: 134/78  Pulse: 94  Height: 5\' 4"  (1.626 m)  Weight: 104 lb 12.8 oz (47.537 kg)  SpO2: 98%         Assessment & Plan:     ICD-9-CM ICD-10-CM   1. MAI (mycobacterium avium-intracellulare) (HCC) 031.0 A31.0 CBC     Basic Metabolic Panel (BMET)     Hepatic  function panel  2. Need for prophylactic vaccination against Streptococcus pneumoniae (pneumococcus) V03.82 Z23 Pneumococcal conjugate vaccine 13-valent IM (Prevnar)    she has been on therapy since February 2016 after diagnosis from a bronchoscopy with lavage 05/17/2014. She is under the impression that therapies only for 9 months. I got back to her on 02/23/2015  after review of the literature  And checking with infectious diseases colleague.   the official recommendation is  Is to continue treatment for 1 year after establishing a culture negative AFB sputum. At this point in time she does not have any sputum production. Therefore I will have her come in and do an induced sputum and check for DNA PCR of MAI and also culture and smear. If this is negative then I'll have a conversation with her about  continuing therapy for one year from this point or if she wants to do a shorter duration of therapy because she is feeling well.  If she is unable to induce sputum then we will have to consider bronchoalveolar lavage    she is agreeable with this plan  > 50% of this > 25 min visit spent in face to face counseling or coordination of care    Dr. Kalman Shan, M.D., [facility].C.P Pulmonary and Critical Care Medicine Staff Physician [facility] [place] Pulmonary and Critical Care Pager: [phone], If no answer or between  15:00h - 7:00h: call [phone]  02/23/2015 5:44 PM

## 2015-02-19 NOTE — Patient Instructions (Signed)
ICD-9-CM ICD-10-CM   1. MAI (mycobacterium avium-intracellulare) (HCC) 031.0 A31.0     Prevnar vaccine 02/19/2015  Do CBC, bmet,  LFT 02/19/2015  Will call with results  Will call you back end of week after review of your chart in detail and review of literature regarding monitoring and stopping treatment

## 2015-02-23 ENCOUNTER — Encounter: Payer: Self-pay | Admitting: Internal Medicine

## 2015-02-23 ENCOUNTER — Telehealth: Payer: Self-pay | Admitting: Internal Medicine

## 2015-02-23 DIAGNOSIS — A310 Pulmonary mycobacterial infection: Secondary | ICD-10-CM

## 2015-02-23 NOTE — Telephone Encounter (Signed)
D/w patient - reive of literature and checking with ID colleague  - do induced sputum for MAI- DNA PCR, smear and culture - typically done early AM. Call RT dept for insturction  - give fu some 6 to 8 weeks affer that sputum culture   Dr. Kalman Shan, M.D., [facility].C.P Pulmonary and Critical Care Medicine Staff Physician [facility] [place] Pulmonary and Critical Care Pager: [phone], If no answer or between  15:00h - 7:00h: call [phone]  02/23/2015 5:06 PM

## 2015-02-26 NOTE — Telephone Encounter (Signed)
Pt cb, [phone]

## 2015-02-26 NOTE — Telephone Encounter (Signed)
Called and spoke to pt. Informed her of the recs per MR. Order placed and appt made for 05/07/14. Pt verbalized understanding and denied any further questions or concerns at this time.

## 2015-03-08 ENCOUNTER — Telehealth: Payer: Self-pay | Admitting: Internal Medicine

## 2015-03-08 DIAGNOSIS — A310 Pulmonary mycobacterial infection: Secondary | ICD-10-CM

## 2015-03-08 NOTE — Telephone Encounter (Signed)
Called RT to verify that order was placed and done. RT verified that order was not placed or done. Order needs to be placed as sputum induction. Once placed RT will contact patient to schedule appointment.  MR please advise if okay to reorder lab

## 2015-03-08 NOTE — Telephone Encounter (Signed)
Erin Black  As you know I  Ordered this as a induced sputum by RT dept. WAs it done as induced sputum? Let me know  Thanks  Dr. Kalman Shan, M.D., [facility].C.P Pulmonary and Critical Care Medicine Staff Physician [place] [place] Pulmonary and Critical Care Pager: [phone], If no answer or between  15:00h - 7:00h: call [phone]  03/08/2015 10:12 AM

## 2015-03-08 NOTE — Telephone Encounter (Signed)
Yes please coordinate with RT dept and order  As induced sputum. It will require early AM visit to RT dept. That is how I wanted it all along. She cannot bring her own sputum

## 2015-03-08 NOTE — Telephone Encounter (Signed)
Called spoke with pt. She reports MR ordered a sputum test on her couple weeks ago but she has not been able to produce any mucus at all. She is wanting to know what she needs to do next? Please advise MR thanks

## 2015-03-08 NOTE — Telephone Encounter (Signed)
Called [facility] RT dept @ [phone] Order was placed for Induced Sputum but they are unable to view the order to schedule the appt from it.  I spoke with MR to make sure order was being placed correctly as there are two different order types for this test - there is a "lab" order and then "RT Care" order. Per MR he is sure that it is supposed to be placed as the Resp Care order not the lab. Per [facility] RT dept, they stated the same thing.  Per MR, speak with RT charge nurse, Wilkie Aye. I called back and Wilkie Aye is gone for the day. Per MR, will route message back to him.

## 2015-03-09 ENCOUNTER — Other Ambulatory Visit (HOSPITAL_COMMUNITY): Payer: Self-pay | Admitting: Respiratory Therapy

## 2015-03-09 NOTE — Telephone Encounter (Signed)
Called and spoke to Erin Black at RT. They are still unable to view the order. Erin Black stated they are waiting on a super user with Epic to contact them back about the issue. Once Erin Black hears from them then she will call us back.   LMTCB to inform pt we are sorting the issues out with the order and Epic and once we have a solution we will inform her.

## 2015-03-09 NOTE — Telephone Encounter (Signed)
Orders placed. Will call to RT to see if they are able to view order.

## 2015-03-09 NOTE — Telephone Encounter (Signed)
Pls order under critical care environ in epick  and we can see if RT receives it

## 2015-03-12 NOTE — Telephone Encounter (Signed)
Patient returned call, may be reached at [phone]

## 2015-03-12 NOTE — Telephone Encounter (Signed)
Called Tara at RT. Delice Bison stated the pt only needs an appt for the induced sputum. Appt made for Wed 11/16 at 1pm at [facility] with RT. Called and spoke to pt and informed her of the appt Pt verbalized understanding and denied any further questions or concerns at this time.   Will forward to MR as FYI.

## 2015-03-14 ENCOUNTER — Ambulatory Visit (HOSPITAL_COMMUNITY)
Admission: RE | Admit: 2015-03-14 | Discharge: 2015-03-14 | Disposition: A | Discharge status: Home / Self Care | Payer: Medicare Other | Source: Ambulatory Visit | Attending: Internal Medicine | Admitting: Internal Medicine

## 2015-03-14 ENCOUNTER — Encounter (HOSPITAL_COMMUNITY): Payer: Medicare Other

## 2015-03-14 DIAGNOSIS — A310 Pulmonary mycobacterial infection: Secondary | ICD-10-CM | POA: Insufficient documentation

## 2015-03-14 MED ORDER — SODIUM CHLORIDE 3 % IN NEBU
4.0000 mL | INHALATION_SOLUTION | Freq: Once | RESPIRATORY_TRACT | Status: DC
  Filled 2015-03-14 (×2): qty 4

## 2015-03-14 NOTE — Progress Notes (Signed)
Erin Black took breathing tx with hypertonic saline, with no coughing at all during tx. Waited for 10 mins. After tx to try an cough something then , with no results.

## 2015-03-23 ENCOUNTER — Encounter (HOSPITAL_COMMUNITY): Payer: Medicare Other

## 2015-03-27 ENCOUNTER — Telehealth: Payer: Self-pay | Admitting: Internal Medicine

## 2015-03-27 DIAGNOSIS — A310 Pulmonary mycobacterial infection: Secondary | ICD-10-CM

## 2015-03-27 NOTE — Telephone Encounter (Signed)
Patient wants update on what she needs to do next.  Patient went on 11/16 for Sputum induction but has not been able to produce sputum.  She was sent home with sputum cup but she says that when she coughs, it is a dry hacking cough, she does not get up anything.  She has been taking antibiotics 3 times a week for 9 months now and wants to know if she needs to continue on the antibiotics.  She wants to know what Dr. Marchelle Gearing wants to do next.

## 2015-03-27 NOTE — Telephone Encounter (Signed)
Called and spoke to her - She is feeling well barring minor cough. STill off methotrexate. Saw rheum recently - no flare up in RA. She is wondering about coming off MAI Rx. Ordered induced sputum but could not produce  Plan - advisd that textbook rec is 1 year after a culture is negative is MAI Rx which  Means 1 more year Rx if culture negative now   - might need bronch to check on cultures - however,   - a) refer ID to get a 2nd opinion if she can come off Rx now to next few months - b) postpoine visit with me to feb 2017 (Cancel the jan 2017 and hcange to feb 2017)  Dr. Kalman Shan, M.D., [facility].C.P Pulmonary and Critical Care Medicine Staff Physician [facility] [place] Pulmonary and Critical Care Pager: [phone], If no answer or between  15:00h - 7:00h: call [phone]  03/27/2015 5:53 PM

## 2015-03-28 NOTE — Telephone Encounter (Signed)
Order has been placed for ID. Spoke with pt. Her ROV has been moved to 06/20/15 at 2pm. Nothing further was needed.

## 2015-04-02 ENCOUNTER — Other Ambulatory Visit: Payer: Self-pay | Admitting: Critical Care Medicine

## 2015-04-05 DIAGNOSIS — Z79899 Other long term (current) drug therapy: Secondary | ICD-10-CM | POA: Diagnosis not present

## 2015-05-01 DIAGNOSIS — E785 Hyperlipidemia, unspecified: Secondary | ICD-10-CM | POA: Diagnosis not present

## 2015-05-01 DIAGNOSIS — Z681 Body mass index (BMI) 19 or less, adult: Secondary | ICD-10-CM | POA: Diagnosis not present

## 2015-05-01 DIAGNOSIS — I5181 Takotsubo syndrome: Secondary | ICD-10-CM | POA: Diagnosis not present

## 2015-05-02 DIAGNOSIS — M899 Disorder of bone, unspecified: Secondary | ICD-10-CM | POA: Diagnosis not present

## 2015-05-08 ENCOUNTER — Ambulatory Visit: Payer: Medicare Other | Admitting: Internal Medicine

## 2015-05-08 ENCOUNTER — Ambulatory Visit (INDEPENDENT_AMBULATORY_CARE_PROVIDER_SITE_OTHER): Payer: Medicare Other | Admitting: Infectious Diseases

## 2015-05-08 ENCOUNTER — Encounter: Payer: Self-pay | Admitting: Infectious Diseases

## 2015-05-08 VITALS — BP 131/84 | HR 94 | Temp 97.4°F | Wt 102.5 lb

## 2015-05-08 DIAGNOSIS — A310 Pulmonary mycobacterial infection: Secondary | ICD-10-CM

## 2015-05-08 NOTE — Progress Notes (Signed)
   Subjective:    Patient ID: Erin Black, female    DOB: 02-Jan-1951, 65 y.o.   MRN: [id number]  HPI 65 yo F with hx of bronchiectasis, nodular lesions seen on CT, and BAL which grew MAI. She was treated with A/E/R TIW since Feb 2016.  Also hx of RA (off MTX since 05-2014).  Has had dry hacking cough, no sputum. She failed sputum induction as well.  Feels like the anbx have "done a number on my body"- has diarrhea with anbx. Has post-nasal drip constantly.  Had labs via PCP last week- al lnl (CMP, lipids, CBC, ESR).   Retired OR Therapist, sports  PMhx, Soc, Fhx reviewed, updated.   Review of Systems  Constitutional: Negative for fever, chills, appetite change and unexpected weight change.  Respiratory: Positive for cough. Negative for shortness of breath.   Cardiovascular: Positive for palpitations. Negative for chest pain and leg swelling.  Gastrointestinal: Positive for diarrhea.  Genitourinary: Negative for difficulty urinating.  dysguesia.      Objective:   Physical Exam  Constitutional: She appears well-developed and well-nourished.  HENT:  Mouth/Throat: No oropharyngeal exudate.  Eyes: EOM are normal.  Neck: Neck supple.  Cardiovascular: Normal rate, regular rhythm and normal heart sounds.   Pulmonary/Chest: Effort normal and breath sounds normal.  Abdominal: Soft. Bowel sounds are normal. There is no tenderness. There is no rebound.  Musculoskeletal: She exhibits no edema.       Arms: Lymphadenopathy:    She has no cervical adenopathy.       Assessment & Plan:

## 2015-05-08 NOTE — Assessment & Plan Note (Signed)
She is doing well.  She has endured a year of therapy and is relatively asx from her MAI (no sputum, good exercise tolerance).  I asked her to stop her anbx and call us if she has any concerns or questions.  I am availble to see if she has worsening of sx. I let her know that is possible that she may have recurrence.

## 2015-06-19 DIAGNOSIS — M069 Rheumatoid arthritis, unspecified: Secondary | ICD-10-CM | POA: Diagnosis not present

## 2015-06-19 DIAGNOSIS — F419 Anxiety disorder, unspecified: Secondary | ICD-10-CM | POA: Diagnosis not present

## 2015-06-19 DIAGNOSIS — I10 Essential (primary) hypertension: Secondary | ICD-10-CM | POA: Diagnosis not present

## 2015-06-19 DIAGNOSIS — I493 Ventricular premature depolarization: Secondary | ICD-10-CM | POA: Diagnosis not present

## 2015-06-19 DIAGNOSIS — A310 Pulmonary mycobacterial infection: Secondary | ICD-10-CM | POA: Diagnosis not present

## 2015-06-20 ENCOUNTER — Ambulatory Visit (INDEPENDENT_AMBULATORY_CARE_PROVIDER_SITE_OTHER): Payer: Medicare Other | Admitting: Internal Medicine

## 2015-06-20 VITALS — BP 122/60 | HR 68 | Ht 64.0 in | Wt 104.4 lb

## 2015-06-20 DIAGNOSIS — R0982 Postnasal drip: Secondary | ICD-10-CM | POA: Diagnosis not present

## 2015-06-20 DIAGNOSIS — R053 Chronic cough: Secondary | ICD-10-CM

## 2015-06-20 DIAGNOSIS — R05 Cough: Secondary | ICD-10-CM | POA: Diagnosis not present

## 2015-06-20 DIAGNOSIS — A310 Pulmonary mycobacterial infection: Secondary | ICD-10-CM

## 2015-06-20 HISTORY — DX: Chronic cough: R05.3

## 2015-06-20 HISTORY — DX: Postnasal drip: R09.82

## 2015-06-20 NOTE — Progress Notes (Signed)
Subjective:     Patient ID: Erin Black, female   DOB: [birth year], 65 y.o.   MRN: [id number]  HPI   OV 06/20/2015  Chief Complaint  Patient presents with  . Follow-up    Pt had induced sputum in 111/2016 - Saw Dr Ninetta Lights in 04/2015 and he stopped all 3 antibiotics -occas dry hacky cough - very little clear sputum - Denies sob, wheezing or chest discomfort    65 year old female with rheumatoid arthritis and was on methotrexate. 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 Presenting symptom was dyspnea in the background of baseline mild chronic cough. She finished 11 months of treatment. We could not get an induced sputum. I referred her to infectious diseases Dr. Ninetta Lights and based on clinical grounds he stopped MAI treatment. She is now here for follow-up. She tells me that with MAI treatment only thing that improved with the dyspnea. The baseline mild chronic cough never really changed. She still has it. She rates it as a 4 out of 10. It is associated with significant sinus drainage. She tells me that she uses Flonase for her sinus drainage [place] but even then she has copious sinus drainage all year around. It is perennial. At this point in time she just wants to have simple conservative measures. She does not want referral to ENT or allergy for this.    has a past medical history of UTI (lower urinary tract infection); RA (rheumatoid arthritis) (HCC); DJD (degenerative joint disease); PVC's (premature ventricular contractions); Osteopenia; Hearing loss; Takotsubo cardiomyopathy; NSTEMI (non-ST elevated myocardial infarction) [facility]) (Nov 2015); Essential hypertension; Anxiety disorder; and Congestive heart failure (HCC).   reports that she quit smoking about 17 years ago. Her smoking use included Cigarettes. She has a 25 pack-year smoking history. She has never used smokeless tobacco.  Past Surgical History  Procedure Laterality Date  . Tonsillectomy and adenoidectomy    .  Bilateral tubal ligation    . Strabismus repair    . Lumbar spine surgery    . Video bronchoscopy Bilateral 05/17/2014    Procedure: VIDEO BRONCHOSCOPY WITHOUT FLUORO;  Surgeon: Storm Frisk, MD;  Location: [facility];  Service: Cardiopulmonary;  Laterality: Bilateral;    Allergies  Allergen Reactions  . Yellow Jacket Venom [Bee Venom] Anaphylaxis    Hives/swelling  . Plavix [Clopidogrel Bisulfate]     rash    Immunization History  Administered Date(s) Administered  . Influenza-Unspecified 01/26/2014, 01/16/2015  . Pneumococcal Conjugate-13 02/19/2015    Family History  Problem Relation Age of Onset  . Emphysema Mother   . Heart disease Mother   . Prostate cancer Father   . Lung cancer Sister      Current outpatient prescriptions:  .  aspirin 81 MG tablet, Take 81 mg by mouth daily., Disp: , Rfl:  .  atorvastatin (LIPITOR) 80 MG tablet, Take 80 mg by mouth. Take 1/2 tablet daily by mouth., Disp: , Rfl:  .  clonazePAM (KLONOPIN) 0.5 MG tablet, Take 0.5 mg by mouth daily. , Disp: , Rfl:  .  EPINEPHrine (EPIPEN 2-PAK) 0.3 mg/0.3 mL IJ SOAJ injection, Inject 0.3 mg into the muscle as needed. , Disp: , Rfl:  .  metoprolol tartrate (LOPRESSOR) 25 MG tablet, Take 25 mg by mouth. Take 1/2 tablet to 1 tablet depending on BP at bedtime by mouth., Disp: , Rfl: 6 .  nitroGLYCERIN (NITROSTAT) 0.4 MG SL tablet, Place 0.4 mg under the tongue every 5 (five) minutes as needed for  chest pain., Disp: , Rfl:  .  tiZANidine (ZANAFLEX) 4 MG tablet, Take 4 mg by mouth every 8 (eight) hours as needed for muscle spasms., Disp: , Rfl:     Review of Systems Per hpi    Objective:   Physical Exam  Constitutional: She is oriented to person, place, and time. She appears well-developed and well-nourished. No distress.  HENT:  Head: Normocephalic and atraumatic.  Right Ear: External ear normal.  Left Ear: External ear normal.  Mouth/Throat: Oropharynx is clear and moist. No oropharyngeal  exudate.  Mild post nasal drip+  Eyes: Conjunctivae and EOM are normal. Pupils are equal, round, and reactive to light. Right eye exhibits no discharge. Left eye exhibits no discharge. No scleral icterus.  Neck: Normal range of motion. Neck supple. No JVD present. No tracheal deviation present. No thyromegaly present.  Cardiovascular: Normal rate, regular rhythm, normal heart sounds and intact distal pulses.  Exam reveals no gallop and no friction rub.   No murmur heard. Pulmonary/Chest: Effort normal and breath sounds normal. No respiratory distress. She has no wheezes. She has no rales. She exhibits no tenderness.  Abdominal: Soft. Bowel sounds are normal. She exhibits no distension and no mass. There is no tenderness. There is no rebound and no guarding.  Musculoskeletal: Normal range of motion. She exhibits no edema or tenderness.  Lymphadenopathy:    She has no cervical adenopathy.  Neurological: She is alert and oriented to person, place, and time. She has normal reflexes. No cranial nerve deficit. She exhibits normal muscle tone. Coordination normal.  Skin: Skin is warm and dry. No rash noted. She is not diaphoretic. No erythema. No pallor.  Psychiatric: She has a normal mood and affect. Her behavior is normal. Judgment and thought content normal.  Vitals reviewed.  Filed Vitals:   06/20/15 1405  BP: 122/60  Pulse: 68  Height: 5\' 4"  (1.626 m)  Weight: 104 lb 6.4 oz (47.356 kg)  SpO2: 95%       Assessment:       ICD-9-CM ICD-10-CM   1. Chronic cough 786.2 R05   2. 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 Post-nasal drip 784.91 R09.82        Plan:       Try netti pot atleast every few weeks Continue nasal steroid Monitor off MAI treatment  followup  - 6 months or sooner if needed - might need FeNO, cough score at followup   Dr. 05-16-1976, M.D., [facility].C.P Pulmonary and Critical Care Medicine Staff Physician [facility]  Pulmonary and Critical Care Pager: [phone], If no answer or between  15:00h - 7:00h: call [phone]  06/20/2015 2:27 PM

## 2015-06-20 NOTE — Patient Instructions (Addendum)
ICD-9-CM ICD-10-CM   1. Chronic cough 786.2 R05   2. 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 Post-nasal drip 784.91 R09.82     Try netti pot atleast every few weeks Continue nasal steroid Monitor off MAI treatment  followup  - 6 months or sooner if needed

## 2015-06-26 ENCOUNTER — Telehealth: Payer: Self-pay | Admitting: Internal Medicine

## 2015-06-26 NOTE — Telephone Encounter (Signed)
Spoke with pt.  Pt states during OV on 2/22 with MR she left a ROI form for medical records for disability to be signed by MR and asked for this to be mailed back to her; NOT sent to Medical Records so she could attach it to some more paperwork before sending it in.  Pt states she left this with nurse after seeing MR and hasn't received it in the mail yet.  Pt states she called Medical Records and was advised they do not have this.  She is checking on the status - has this been signed and mailed?  Robynn Pane, please advise.  Thank you.

## 2015-06-28 NOTE — Telephone Encounter (Signed)
I have form, it is a release form for [employer] to release information to the disability company. There is no where for MR to sign. Called and spoke to pt. Pt is requesting I send the form back to her so she can send it to the disability company. Verified home address. Form placed in outgoing mail. Pt aware. Nothing further needed.

## 2015-07-03 DIAGNOSIS — Z86010 Personal history of colonic polyps: Secondary | ICD-10-CM | POA: Diagnosis not present

## 2015-07-03 DIAGNOSIS — Z1211 Encounter for screening for malignant neoplasm of colon: Secondary | ICD-10-CM | POA: Diagnosis not present

## 2015-07-27 DIAGNOSIS — Z1211 Encounter for screening for malignant neoplasm of colon: Secondary | ICD-10-CM | POA: Diagnosis not present

## 2015-07-27 DIAGNOSIS — K649 Unspecified hemorrhoids: Secondary | ICD-10-CM | POA: Diagnosis not present

## 2015-07-27 DIAGNOSIS — K5730 Diverticulosis of large intestine without perforation or abscess without bleeding: Secondary | ICD-10-CM | POA: Diagnosis not present

## 2015-07-27 DIAGNOSIS — K641 Second degree hemorrhoids: Secondary | ICD-10-CM | POA: Diagnosis not present

## 2015-07-27 DIAGNOSIS — Z86010 Personal history of colonic polyps: Secondary | ICD-10-CM | POA: Diagnosis not present

## 2015-10-03 DIAGNOSIS — M1990 Unspecified osteoarthritis, unspecified site: Secondary | ICD-10-CM | POA: Diagnosis not present

## 2015-10-03 DIAGNOSIS — M0579 Rheumatoid arthritis with rheumatoid factor of multiple sites without organ or systems involvement: Secondary | ICD-10-CM | POA: Diagnosis not present

## 2015-11-21 DIAGNOSIS — R55 Syncope and collapse: Secondary | ICD-10-CM | POA: Diagnosis not present

## 2015-11-21 DIAGNOSIS — I493 Ventricular premature depolarization: Secondary | ICD-10-CM | POA: Diagnosis not present

## 2015-11-21 DIAGNOSIS — E785 Hyperlipidemia, unspecified: Secondary | ICD-10-CM | POA: Diagnosis not present

## 2015-11-21 DIAGNOSIS — I951 Orthostatic hypotension: Secondary | ICD-10-CM | POA: Diagnosis not present

## 2015-11-21 DIAGNOSIS — Z681 Body mass index (BMI) 19 or less, adult: Secondary | ICD-10-CM | POA: Diagnosis not present

## 2015-11-21 DIAGNOSIS — I5181 Takotsubo syndrome: Secondary | ICD-10-CM | POA: Diagnosis not present

## 2015-12-11 DIAGNOSIS — R1013 Epigastric pain: Secondary | ICD-10-CM | POA: Diagnosis not present

## 2015-12-11 DIAGNOSIS — R1011 Right upper quadrant pain: Secondary | ICD-10-CM | POA: Diagnosis not present

## 2015-12-11 DIAGNOSIS — I10 Essential (primary) hypertension: Secondary | ICD-10-CM | POA: Diagnosis not present

## 2015-12-11 DIAGNOSIS — R110 Nausea: Secondary | ICD-10-CM | POA: Diagnosis not present

## 2015-12-14 DIAGNOSIS — R1011 Right upper quadrant pain: Secondary | ICD-10-CM | POA: Diagnosis not present

## 2015-12-17 DIAGNOSIS — R509 Fever, unspecified: Secondary | ICD-10-CM | POA: Diagnosis not present

## 2015-12-17 DIAGNOSIS — R1011 Right upper quadrant pain: Secondary | ICD-10-CM | POA: Diagnosis not present

## 2015-12-17 DIAGNOSIS — R21 Rash and other nonspecific skin eruption: Secondary | ICD-10-CM | POA: Diagnosis not present

## 2016-01-22 DIAGNOSIS — R1011 Right upper quadrant pain: Secondary | ICD-10-CM | POA: Diagnosis not present

## 2016-01-29 DIAGNOSIS — Z23 Encounter for immunization: Secondary | ICD-10-CM | POA: Diagnosis not present

## 2016-02-04 DIAGNOSIS — M810 Age-related osteoporosis without current pathological fracture: Secondary | ICD-10-CM | POA: Diagnosis not present

## 2016-02-04 DIAGNOSIS — M85862 Other specified disorders of bone density and structure, left lower leg: Secondary | ICD-10-CM | POA: Diagnosis not present

## 2016-02-13 DIAGNOSIS — J0100 Acute maxillary sinusitis, unspecified: Secondary | ICD-10-CM | POA: Diagnosis not present

## 2016-02-21 ENCOUNTER — Encounter (INDEPENDENT_AMBULATORY_CARE_PROVIDER_SITE_OTHER): Payer: Self-pay

## 2016-02-21 ENCOUNTER — Encounter: Payer: Self-pay | Admitting: Internal Medicine

## 2016-02-21 ENCOUNTER — Ambulatory Visit (INDEPENDENT_AMBULATORY_CARE_PROVIDER_SITE_OTHER): Payer: Medicare Other | Admitting: Internal Medicine

## 2016-02-21 VITALS — BP 108/76 | HR 96 | Ht 64.0 in | Wt 95.2 lb

## 2016-02-21 DIAGNOSIS — R0982 Postnasal drip: Secondary | ICD-10-CM | POA: Diagnosis not present

## 2016-02-21 DIAGNOSIS — Z8619 Personal history of other infectious and parasitic diseases: Secondary | ICD-10-CM | POA: Diagnosis not present

## 2016-02-21 DIAGNOSIS — R05 Cough: Secondary | ICD-10-CM

## 2016-02-21 DIAGNOSIS — J479 Bronchiectasis, uncomplicated: Secondary | ICD-10-CM | POA: Diagnosis not present

## 2016-02-21 DIAGNOSIS — R053 Chronic cough: Secondary | ICD-10-CM

## 2016-02-21 DIAGNOSIS — M069 Rheumatoid arthritis, unspecified: Secondary | ICD-10-CM

## 2016-02-21 HISTORY — DX: Personal history of other infectious and parasitic diseases: Z86.19

## 2016-02-21 LAB — NITRIC OXIDE: NITRIC OXIDE: 33

## 2016-02-21 NOTE — Progress Notes (Signed)
Subjective:     Patient ID: Erin Black, female   DOB: 1951/01/07, 65 y.o.   MRN: [id number]  HPI    OV 06/20/2015  Chief Complaint  Patient presents with  . Follow-up    Pt had induced sputum in 111/2016 - Saw Dr Ninetta Lights in 04/2015 and he stopped all 3 antibiotics -occas dry hacky cough - very little clear sputum - Denies sob, wheezing or chest discomfort    65 year old female with rheumatoid arthritis and was on methotrexate. 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 Presenting symptom was dyspnea in the background of baseline mild chronic cough. She finished 11 months of treatment. We could not get an induced sputum. I referred her to infectious diseases Dr. Ninetta Lights and based on clinical grounds he stopped MAI treatment. She is now here for follow-up. She tells me that with MAI treatment only thing that improved with the dyspnea. The baseline mild chronic cough never really changed. She still has it. She rates it as a 4 out of 10. It is associated with significant sinus drainage. She tells me that she uses Flonase for her sinus drainage [place] but even then she has copious sinus drainage all year around. It is perennial. At this point in time she just wants to have simple conservative measures. She does not want referral to ENT or allergy for this.    OV 02/21/2016  Chief Complaint  Patient presents with  . Follow-up    Pt c/o increase in prod coug with little mucus production light yellow. Pt states she was 10 days of abx, 3 days pred, and IM injection of [place]. Pt states she received this becuase of her acute sinusitis.Pt states she has had rocky mtn spotted fever since being seen last.     65 year old with RA. In feb 2016 dx with  MAI with bronch following only symptom of dyspnea (Did have previous baseline mild cough that was unchanged but no sputum or fever). After this and methotrexate was stopped and she was started on MAI treatment. Dyspnea resolved with this.  Based on mild cough did not change. Baseline significant sinus drainage did not change. She finished 11 months of treatment and the end of 2016 early 2017 infectious diseases stop her treatment. When I saw in February 2017 she was feeling fine other than her baseline sinus drainage and cough. She comes in for office visit today she tells me that in August 2017 she ended up with fever, chills, right upper quadrant pain, night sweats cough worsening. She denies any tick bites. However 7/10 days into the illness she was diagnosed approximately one spotted fever based on blood tests and was given 10 days of doxycycline. Around the same was also given Nexium which he broke out in a rash around her abdomen. She denies this was shingles. Subsequent to the doxycycline her symptoms resolved except that she had persistent cough and sinus drainage. This was no worse than baseline. It was also progressive after initial improvement. Subsequently approximately 9 days ago primary care physician gave her 4 days of steroids and 10 days of Omnicef. She is now on the ninth of Omnicef. Towards the last day of Omnicef. She is beginning to feel better with the sinus drainage and cough but still much worse than baseline. In addition last night she had a fever of 102. She is now worried that her MAI is back. She is not on any rheumatoid arthritis treatment and she feels is  under control  Exhaled nitric oxide is slightly elevated at 33 ppb     has a past medical history of Anxiety disorder; Congestive heart failure (HCC); DJD (degenerative joint disease); Essential hypertension; Hearing loss; NSTEMI (non-ST elevated myocardial infarction) [facility]) (Nov 2015); Osteopenia; PVC's (premature ventricular contractions); RA (rheumatoid arthritis) (HCC); Takotsubo cardiomyopathy; and UTI (lower urinary tract infection).   reports that she quit smoking about 17 years ago. Her smoking use included Cigarettes. She has a 25.00 pack-year smoking  history. She has never used smokeless tobacco.  Past Surgical History:  Procedure Laterality Date  . bilateral tubal ligation    . LUMBAR SPINE SURGERY    . strabismus repair    . TONSILLECTOMY AND ADENOIDECTOMY    . VIDEO BRONCHOSCOPY Bilateral 05/17/2014   Procedure: VIDEO BRONCHOSCOPY WITHOUT FLUORO;  Surgeon: Storm Frisk, MD;  Location: [facility];  Service: Cardiopulmonary;  Laterality: Bilateral;    Allergies  Allergen Reactions  . Yellow Jacket Venom [Bee Venom] Anaphylaxis    Hives/swelling  . Plavix [Clopidogrel Bisulfate]     rash    Immunization History  Administered Date(s) Administered  . Influenza, High Dose Seasonal PF 01/22/2016  . Influenza-Unspecified 01/26/2014, 01/16/2015  . Pneumococcal Conjugate-13 02/19/2015    Family History  Problem Relation Age of Onset  . Emphysema Mother   . Heart disease Mother   . Prostate cancer Father   . Lung cancer Sister      Current Outpatient Prescriptions:  .  aspirin 81 MG tablet, Take 81 mg by mouth daily., Disp: , Rfl:  .  atorvastatin (LIPITOR) 80 MG tablet, Take 80 mg by mouth. Take 1/2 tablet daily by mouth., Disp: , Rfl:  .  clonazePAM (KLONOPIN) 0.5 MG tablet, Take 0.5 mg by mouth daily. , Disp: , Rfl:  .  EPINEPHrine (EPIPEN 2-PAK) 0.3 mg/0.3 mL IJ SOAJ injection, Inject 0.3 mg into the muscle as needed. , Disp: , Rfl:  .  metoprolol tartrate (LOPRESSOR) 25 MG tablet, Take 25 mg by mouth. Take 1/2 tablet to 1 tablet depending on BP at bedtime by mouth., Disp: , Rfl: 6 .  nitroGLYCERIN (NITROSTAT) 0.4 MG SL tablet, Place 0.4 mg under the tongue every 5 (five) minutes as needed for chest pain., Disp: , Rfl:  .  tiZANidine (ZANAFLEX) 4 MG tablet, Take 4 mg by mouth every 8 (eight) hours as needed for muscle spasms., Disp: , Rfl:     Review of Systems     Objective:   Physical Exam  Constitutional: She is oriented to person, place, and time. She appears well-developed and well-nourished. No distress.   HENT:  Head: Normocephalic and atraumatic.  Right Ear: External ear normal.  Left Ear: External ear normal.  Mouth/Throat: Oropharynx is clear and moist. No oropharyngeal exudate.  Mild postnasal drip present  Eyes: Conjunctivae and EOM are normal. Pupils are equal, round, and reactive to light. Right eye exhibits no discharge. Left eye exhibits no discharge. No scleral icterus.  Neck: Normal range of motion. Neck supple. No JVD present. No tracheal deviation present. No thyromegaly present.  Cardiovascular: Normal rate, regular rhythm, normal heart sounds and intact distal pulses.  Exam reveals no gallop and no friction rub.   No murmur heard. Pulmonary/Chest: Effort normal and breath sounds normal. No respiratory distress. She has no wheezes. She has no rales. She exhibits no tenderness.  Abdominal: Soft. Bowel sounds are normal. She exhibits no distension and no mass. There is no tenderness. There is no rebound and  no guarding.  Musculoskeletal: Normal range of motion. She exhibits no edema or tenderness.  Lymphadenopathy:    She has no cervical adenopathy.  Neurological: She is alert and oriented to person, place, and time. She has normal reflexes. No cranial nerve deficit. She exhibits normal muscle tone. Coordination normal.  Skin: Skin is warm and dry. No rash noted. She is not diaphoretic. No erythema. No pallor.  Psychiatric: She has a normal mood and affect. Her behavior is normal. Judgment and thought content normal.  Vitals reviewed.   Vitals:   02/21/16 0931  BP: 108/76  Pulse: 96  SpO2: 97%  Weight: 95 lb 3.2 oz (43.2 kg)  Height: 5\' 4"  (1.626 m)   Estimated body mass index is 16.34 kg/m as calculated from the following:   Height as of this encounter: 5\' 4"  (1.626 m).   Weight as of this encounter: 95 lb 3.2 oz (43.2 kg).      Assessment:       ICD-9-CM ICD-10-CM   1. Chronic cough 786.2 R05 CT Chest High Resolution     Nitric oxide     CT Maxillofacial LTD WO  CM  2. Post-nasal drip 784.91 R09.82 CT Maxillofacial LTD WO CM  3. History of MAI infection V12.09 Z86.19 CT Maxillofacial LTD WO CM  4. Bronchiectasis without complication (HCC) 494.0 J47.9 CT Maxillofacial LTD WO CM  5. Rheumatoid arthritis involving multiple sites, unspecified rheumatoid factor presence (HCC) 714.0 M06.9 CT Maxillofacial LTD WO CM   Symptoms have deteriorated plu with new symptoms    Plan:         - This could be asthma associated with postviral reactive cough and postnasal drip. On the other hand it could be MAI. At this point she is very concerned about recurrence of MAI. We also need to rule out chronic sinus congestion because is an ongoing chronic problem. For this and we will get a CT sinus and CT chest and make further management plans accordingly. If these are truly negative to try empiric inhaler therapy for asthma  She is understanding of the plan  Dr. Kalman Shan, M.D., [facility].C.P Pulmonary and Critical Care Medicine Staff Physician [facility] Pulmonary and Critical Care Pager: [phone], If no answer or between  15:00h - 7:00h: call [phone]  02/21/2016 10:11 AM

## 2016-02-21 NOTE — Patient Instructions (Signed)
ICD-9-CM ICD-10-CM   1. Chronic cough 786.2 R05   2. Post-nasal drip 784.91 R09.82   3. History of MAI infection V12.09 Z86.19   4. Bronchiectasis without complication (HCC) 494.0 J47.9   5. Rheumatoid arthritis involving multiple sites, unspecified rheumatoid factor presence (HCC) 714.0 M06.9     Do feno test 02/21/2016 in office  Do HRCT chest - at [facility] or [facility] or [employer] or [place] 02/21/2016 - will call with results  Further decision making after CT

## 2016-02-25 ENCOUNTER — Ambulatory Visit
Admission: RE | Admit: 2016-02-25 | Discharge: 2016-02-25 | Disposition: A | Discharge status: Home / Self Care | Payer: Medicare Other | Source: Ambulatory Visit | Attending: Internal Medicine | Admitting: Internal Medicine

## 2016-02-25 ENCOUNTER — Ambulatory Visit (INDEPENDENT_AMBULATORY_CARE_PROVIDER_SITE_OTHER)
Admission: RE | Admit: 2016-02-25 | Discharge: 2016-02-25 | Disposition: A | Discharge status: Home / Self Care | Payer: Medicare Other | Source: Ambulatory Visit | Attending: Internal Medicine | Admitting: Internal Medicine

## 2016-02-25 DIAGNOSIS — R053 Chronic cough: Secondary | ICD-10-CM

## 2016-02-25 DIAGNOSIS — R05 Cough: Secondary | ICD-10-CM | POA: Diagnosis not present

## 2016-02-25 DIAGNOSIS — J479 Bronchiectasis, uncomplicated: Secondary | ICD-10-CM | POA: Diagnosis not present

## 2016-02-25 DIAGNOSIS — R0982 Postnasal drip: Secondary | ICD-10-CM

## 2016-02-25 DIAGNOSIS — R918 Other nonspecific abnormal finding of lung field: Secondary | ICD-10-CM | POA: Diagnosis not present

## 2016-02-25 DIAGNOSIS — M069 Rheumatoid arthritis, unspecified: Secondary | ICD-10-CM

## 2016-02-25 DIAGNOSIS — Z8619 Personal history of other infectious and parasitic diseases: Secondary | ICD-10-CM

## 2016-02-27 ENCOUNTER — Telehealth: Payer: Self-pay | Admitting: Internal Medicine

## 2016-02-27 MED ORDER — BUDESONIDE-FORMOTEROL FUMARATE 80-4.5 MCG/ACT IN AERO: 2.0000 | INHALATION_SPRAY | Freq: Two times a day (BID) | RESPIRATORY_TRACT | 0 refills | Status: DC

## 2016-02-27 NOTE — Telephone Encounter (Signed)
  Let Erin Black know that   1. CT chest now change in MAI nodulaoirty compated to 2015 2. There is mild emphysema on CT - basis: prior smoking 3. Rt ethmodi sinus partially opacified  Plan  - try low dose symbicort or breo or dulera (hher feno was slightly high) - for cough  - daily netti pot and nasal steroid  - +/- refer ENT  - if she wishes   - I prefer to do above simplistic plan before rushing off to trying MAI rx again    - FU 2 months or sooner if needed  Thanks  Dr. Kalman Shan, M.D., [facility].C.P Pulmonary and Critical Care Medicine Staff Physician  System [place] Pulmonary and Critical Care Pager: [phone], If no answer or between  15:00h - 7:00h: call [phone]  02/27/2016 1:42 PM     Ct Chest High Resolution  Result Date: 02/25/2016 CLINICAL DATA:  Chronic cough, postnasal drip, bronchiectasis, rheumatoid arthritis. EXAM: CT CHEST WITHOUT CONTRAST TECHNIQUE: Multidetector CT imaging of the chest was performed following the standard protocol without intravenous contrast. High resolution imaging of the lungs, as well as inspiratory and expiratory imaging, was performed. COMPARISON:  03/12/2014. FINDINGS: Cardiovascular: Atherosclerotic calcification of the arterial vasculature, including coronary arteries. Heart size normal. No pericardial effusion. Mediastinum/Nodes: Mediastinal lymph nodes are not enlarged by CT size criteria. Hilar regions are difficult to definitively evaluate without IV contrast. No axillary adenopathy. Esophagus is grossly unremarkable. Lungs/Pleura: Biapical pleural parenchymal scarring. Centrilobular emphysema. Mild scattered peribronchovascular nodularity and mucoid impaction with volume loss in the right middle lobe and lingula, as before. No pleural fluid. Airway is unremarkable. No air trapping. Upper Abdomen: Sub cm low-attenuation lesion in the right hepatic lobe is incompletely visualized and too small to  characterize. Visualized portions of the liver, adrenal glands, kidneys, spleen, pancreas and stomach are grossly unremarkable. Musculoskeletal: No worrisome lytic or sclerotic lesions. IMPRESSION: 1. No evidence of fibrotic interstitial lung disease. 2. Scattered peribronchovascular nodularity, mucoid impaction and volume loss, unchanged, indicative of mycobacterium avium complex. 3.  Emphysema (ICD10-J43.9). 4. Aortic atherosclerosis (ICD10-170.0). Coronary artery calcification. Electronically Signed   By: Leanna Battles M.D.   On: 02/25/2016 10:14   Ct Maxillofacial Ltd Wo Cm  Result Date: 02/25/2016 CLINICAL DATA:  Chronic cough, postnasal drip EXAM: CT PARANASAL SINUS LIMITED WITHOUT CONTRAST TECHNIQUE: Non-contiguous multidetector CT images of the paranasal sinuses were obtained in a single plane without contrast. COMPARISON:  None. FINDINGS: Bilateral maxillary sinus is well aerated patent. Bilateral sphenoid sinus well aerated. Mild mucosal thickening with partial opacification right ethmoid air cells. Frontal sinuses are unremarkable. No paranasal sinuses air-fluid levels. Mild left nasal septum deviation. Bilateral semilunar canal is patent. Metallic dental artifact are noted. The nasal turbinates are unremarkable. IMPRESSION: No paranasal sinuses air-fluid level. Mild mucosal thickening with partial opacification right ethmoid air cells. Mild left nasal septum deviation. Electronically Signed   By: Natasha Mead M.D.   On: 02/25/2016 10:14

## 2016-02-27 NOTE — Telephone Encounter (Signed)
Called and spoke to pt. Informed her of the results and recs per MR. Symbicort inhaler placed up front for pick up and appt with MR made for 04/2016. Pt verbalized understanding and states she would prefer to hold off on the ENT referral for now till after she tries the inhaler.

## 2016-03-05 DIAGNOSIS — Z1231 Encounter for screening mammogram for malignant neoplasm of breast: Secondary | ICD-10-CM | POA: Diagnosis not present

## 2016-03-05 DIAGNOSIS — Z01419 Encounter for gynecological examination (general) (routine) without abnormal findings: Secondary | ICD-10-CM | POA: Diagnosis not present

## 2016-04-01 DIAGNOSIS — Z1231 Encounter for screening mammogram for malignant neoplasm of breast: Secondary | ICD-10-CM | POA: Diagnosis not present

## 2016-04-03 DIAGNOSIS — M19042 Primary osteoarthritis, left hand: Secondary | ICD-10-CM | POA: Diagnosis not present

## 2016-04-03 DIAGNOSIS — M79643 Pain in unspecified hand: Secondary | ICD-10-CM | POA: Diagnosis not present

## 2016-04-03 DIAGNOSIS — M1990 Unspecified osteoarthritis, unspecified site: Secondary | ICD-10-CM | POA: Diagnosis not present

## 2016-04-03 DIAGNOSIS — M19041 Primary osteoarthritis, right hand: Secondary | ICD-10-CM | POA: Diagnosis not present

## 2016-04-03 DIAGNOSIS — M0579 Rheumatoid arthritis with rheumatoid factor of multiple sites without organ or systems involvement: Secondary | ICD-10-CM | POA: Diagnosis not present

## 2016-05-13 ENCOUNTER — Ambulatory Visit (INDEPENDENT_AMBULATORY_CARE_PROVIDER_SITE_OTHER): Payer: Medicare Other | Admitting: Internal Medicine

## 2016-05-13 ENCOUNTER — Encounter: Payer: Self-pay | Admitting: Internal Medicine

## 2016-05-13 VITALS — BP 122/76 | HR 80 | Ht 64.0 in | Wt 93.4 lb

## 2016-05-13 DIAGNOSIS — J439 Emphysema, unspecified: Secondary | ICD-10-CM | POA: Diagnosis not present

## 2016-05-13 DIAGNOSIS — Z8619 Personal history of other infectious and parasitic diseases: Secondary | ICD-10-CM

## 2016-05-13 DIAGNOSIS — J0181 Other acute recurrent sinusitis: Secondary | ICD-10-CM | POA: Diagnosis not present

## 2016-05-13 DIAGNOSIS — R0982 Postnasal drip: Secondary | ICD-10-CM

## 2016-05-13 DIAGNOSIS — J0120 Acute ethmoidal sinusitis, unspecified: Secondary | ICD-10-CM

## 2016-05-13 HISTORY — DX: Other acute recurrent sinusitis: J01.81

## 2016-05-13 HISTORY — DX: Emphysema, unspecified: J43.9

## 2016-05-13 MED ORDER — PREDNISONE 10 MG PO TABS: ORAL_TABLET | ORAL | 0 refills | Status: DC

## 2016-05-13 MED ORDER — BUDESONIDE-FORMOTEROL FUMARATE 80-4.5 MCG/ACT IN AERO: 2.0000 | INHALATION_SPRAY | Freq: Two times a day (BID) | RESPIRATORY_TRACT | 12 refills | Status: DC

## 2016-05-13 MED ORDER — LEVOFLOXACIN 500 MG PO TABS: 500.0000 mg | ORAL_TABLET | Freq: Every day | ORAL | 0 refills | Status: DC

## 2016-05-13 NOTE — Addendum Note (Signed)
Addended by: Sheran Luz on: 05/13/2016 11:55 AM   Modules accepted: Orders

## 2016-05-13 NOTE — Patient Instructions (Addendum)
ICD-9-CM ICD-10-CM   1. Other acute recurrent sinusitis 461.9 J01.81   2. Post-nasal drip 784.91 R09.82 Ambulatory referral to ENT  3. History of MAI infection V12.09 Z86.19   4. Pulmonary emphysema, unspecified emphysema type (HCC) 492.8 J43.9     Refer ENT  Continue Levaquin 500 milligrams once daily for another 5 days to make a total of 10 days  Please take Take prednisone 40mg  once daily x 3 days, then 30mg  once daily x 3 days, then 20mg  once daily x 3 days, then prednisone 10mg  once daily  x 3 days and stop  Continue Symbicort inhaler daily without fail  Follow-up - Return in 3 months or sooner if needed - At follow-up depending on symptom profile we will decide on bronchoscopy to evaluate his MAI is back

## 2016-05-13 NOTE — Progress Notes (Addendum)
Subjective:     Patient ID: Erin Black, female   DOB: [birth year], 66 y.o.   MRN: [id number]  HPI    OV 06/20/2015  Chief Complaint  Patient presents with  . Follow-up    Pt had induced sputum in 111/2016 - Saw Dr Ninetta Lights in 04/2015 and he stopped all 3 antibiotics -occas dry hacky cough - very little clear sputum - Denies sob, wheezing or chest discomfort    66 year old female with rheumatoid arthritis and was on methotrexate. 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 Presenting symptom was dyspnea in the background of baseline mild chronic cough. She finished 11 months of treatment. We could not get an induced sputum. I referred her to infectious diseases Dr. Ninetta Lights and based on clinical grounds he stopped MAI treatment. She is now here for follow-up. She tells me that with MAI treatment only thing that improved with the dyspnea. The baseline mild chronic cough never really changed. She still has it. She rates it as a 4 out of 10. It is associated with significant sinus drainage. She tells me that she uses Flonase for her sinus drainage [place] but even then she has copious sinus drainage all year around. It is perennial. At this point in time she just wants to have simple conservative measures. She does not want referral to ENT or allergy for this.    OV 02/21/2016  Chief Complaint  Patient presents with  . Follow-up    Pt c/o increase in prod coug with little mucus production light yellow. Pt states she was 10 days of abx, 3 days pred, and IM injection of Kenlog. Pt states she received this becuase of her acute sinusitis.Pt states she has had rocky mtn spotted fever since being seen last.     66 year old with RA. In feb 2016 dx with  MAI with bronch following only symptom of dyspnea (Did have previous baseline mild cough that was unchanged but no sputum or fever). After this and methotrexate was stopped and she was started on MAI treatment. Dyspnea resolved with this.  Based on mild cough did not change. Baseline significant sinus drainage did not change. She finished 11 months of treatment and the end of 2016 early 2017 infectious diseases stop her treatment. When I saw in February 2017 she was feeling fine other than her baseline sinus drainage and cough. She comes in for office visit today she tells me that in August 2017 she ended up with fever, chills, right upper quadrant pain, night sweats cough worsening. She denies any tick bites. However 7/10 days into the illness she was diagnosed approximately one spotted fever based on blood tests and was given 10 days of doxycycline. Around the same was also given Nexium which he broke out in a rash around her abdomen. She denies this was shingles. Subsequent to the doxycycline her symptoms resolved except that she had persistent cough and sinus drainage. This was no worse than baseline. It was also progressive after initial improvement. Subsequently approximately 9 days ago primary care physician gave her 4 days of steroids and 10 days of Omnicef. She is now on the ninth of Omnicef. Towards the last day of Omnicef. She is beginning to feel better with the sinus drainage and cough but still much worse than baseline. In addition last night she had a fever of 102. She is now worried that her MAI is back. She is not on any rheumatoid arthritis treatment and she feels is  under control  Exhaled nitric oxide is slightly elevated at 33 ppb  PLAN 1. CT chest no change in MAI nodulaoirty compated to 2015 2. There is mild emphysema on CT - basis: prior smoking 3. Rt ethmodi sinus partially opacified  Plan  - try low dose symbicort or breo or dulera (hher feno was slightly high) - for cough  - daily netti pot and nasal steroid  - +/- refer ENT  - if she wishes   - I prefer to do above simplistic plan before rushing off to trying MAI rx ag  OV 05/13/2016  Chief Complaint  Patient presents with  . Follow-up    Pt c/o prod  cough with green mucus, sinus congestion with white mucus, increase in SOB x weeks. Pt denies f/c/s.     A 66 year old female with history of MAI infection 2015 associated with presence of emphysema on CT chest October 2017 and recurrent sinus infection  Here for follow-up. In the last month or so she's had persistent sinus congestion associated with green sputum. The last few weeks it is worse. She is now on 5 days of Levaquin and is only partially resolved. She is having green sputum. At last visit because of the presence of emphysema in October 2017 I gave her Symbicort which did not help at a baseline state and is currently helping. Even though she is still significantly symptomatic with postnasal drainage and chest congestion. She feels it is descending into her chest. She is frustrated by constant sinus drainage and recurrent sinusitis. She has never seen ENT. CT sinus in October 2017 did show partially opacified right ethmoid sinus. He is also wondering about the fact if her MAI has recurred     has a past medical history of Anxiety disorder; Congestive heart failure (HCC); DJD (degenerative joint disease); Essential hypertension; Hearing loss; NSTEMI (non-ST elevated myocardial infarction) [facility]) (Nov 2015); Osteopenia; PVC's (premature ventricular contractions); RA (rheumatoid arthritis) (HCC); Takotsubo cardiomyopathy; and UTI (lower urinary tract infection).   reports that she quit smoking about 18 years ago. Her smoking use included Cigarettes. She has a 25.00 pack-year smoking history. She has never used smokeless tobacco.  Past Surgical History:  Procedure Laterality Date  . bilateral tubal ligation    . LUMBAR SPINE SURGERY    . strabismus repair    . TONSILLECTOMY AND ADENOIDECTOMY    . VIDEO BRONCHOSCOPY Bilateral 05/17/2014   Procedure: VIDEO BRONCHOSCOPY WITHOUT FLUORO;  Surgeon: Storm Frisk, MD;  Location: [facility];  Service: Cardiopulmonary;  Laterality: Bilateral;     Allergies  Allergen Reactions  . Yellow Jacket Venom [Bee Venom] Anaphylaxis    Hives/swelling  . Omnicef [Cefdinir]     rash  . Plavix [Clopidogrel Bisulfate]     rash    Immunization History  Administered Date(s) Administered  . Influenza, High Dose Seasonal PF 01/22/2016  . Influenza-Unspecified 01/26/2014, 01/16/2015  . Pneumococcal Conjugate-13 02/19/2015    Family History  Problem Relation Age of Onset  . Emphysema Mother   . Heart disease Mother   . Prostate cancer Father   . Lung cancer Sister      Current Outpatient Prescriptions:  .  aspirin 81 MG tablet, Take 81 mg by mouth daily., Disp: , Rfl:  .  atorvastatin (LIPITOR) 80 MG tablet, Take 80 mg by mouth. Take 1/2 tablet daily by mouth., Disp: , Rfl:  .  budesonide-formoterol (SYMBICORT) 80-4.5 MCG/ACT inhaler, Inhale 2 puffs into the lungs 2 (two) times daily., Disp:  1 Inhaler, Rfl: 0 .  clonazePAM (KLONOPIN) 0.5 MG tablet, Take 0.5 mg by mouth daily. , Disp: , Rfl:  .  EPINEPHrine (EPIPEN 2-PAK) 0.3 mg/0.3 mL IJ SOAJ injection, Inject 0.3 mg into the muscle as needed. , Disp: , Rfl:  .  levofloxacin (LEVAQUIN) 750 MG tablet, Take 750 mg by mouth daily., Disp: , Rfl:  .  metoprolol tartrate (LOPRESSOR) 25 MG tablet, Take 25 mg by mouth. Take 1/2 tablet to 1 tablet depending on BP at bedtime by mouth., Disp: , Rfl: 6 .  nitroGLYCERIN (NITROSTAT) 0.4 MG SL tablet, Place 0.4 mg under the tongue every 5 (five) minutes as needed for chest pain., Disp: , Rfl:  .  tiZANidine (ZANAFLEX) 4 MG tablet, Take 4 mg by mouth every 8 (eight) hours as needed for muscle spasms., Disp: , Rfl:    Review of Systems     Objective:   Physical Exam Vitals:   05/13/16 1132  BP: 122/76  Pulse: 80  SpO2: 96%  Weight: 93 lb 6.4 oz (42.4 kg)  Height: 5\' 4"  (1.626 m)    Estimated body mass index is 16.03 kg/m as calculated from the following:   Height as of this encounter: 5\' 4"  (1.626 m).   Weight as of this encounter: 93  lb 6.4 oz (42.4 kg).      Assessment:       ICD-9-CM ICD-10-CM   1. Acute abscess of ethmoidal sinus 461.2 J01.20   2. Post-nasal drip 784.91 R09.82   3. History of MAI infection V12.09 Z86.19   4. Pulmonary emphysema, unspecified emphysema type (HCC) 492.8 J43.9        Plan:       Refer ENT  Continue Levaquin 500 milligrams once daily for another 5 days to make a total of 10 days  Please take Take prednisone 40mg  once daily x 3 days, then 30mg  once daily x 3 days, then 20mg  once daily x 3 days, then prednisone 10mg  once daily  x 3 days and stop  Continue Symbicort inhaler daily without fail  Follow-up - Return in 3 months or sooner if needed - At follow-up depending on symptom profile we will decide on bronchoscopy to evaluate if  MAI is back    Dr. Kalman Shan, M.D., [facility].C.P Pulmonary and Critical Care Medicine Staff Physician [facility] and Critical Care Pager: [phone], If no answer or between  15:00h - 7:00h: call [phone]  05/13/2016 11:50 AM

## 2016-05-26 DIAGNOSIS — I493 Ventricular premature depolarization: Secondary | ICD-10-CM | POA: Diagnosis not present

## 2016-05-26 DIAGNOSIS — R55 Syncope and collapse: Secondary | ICD-10-CM | POA: Diagnosis not present

## 2016-05-26 DIAGNOSIS — I951 Orthostatic hypotension: Secondary | ICD-10-CM | POA: Diagnosis not present

## 2016-05-26 DIAGNOSIS — I5181 Takotsubo syndrome: Secondary | ICD-10-CM | POA: Diagnosis not present

## 2016-05-26 DIAGNOSIS — Z681 Body mass index (BMI) 19 or less, adult: Secondary | ICD-10-CM | POA: Diagnosis not present

## 2016-05-26 DIAGNOSIS — I10 Essential (primary) hypertension: Secondary | ICD-10-CM | POA: Diagnosis not present

## 2016-05-28 DIAGNOSIS — J342 Deviated nasal septum: Secondary | ICD-10-CM | POA: Insufficient documentation

## 2016-05-28 DIAGNOSIS — J3089 Other allergic rhinitis: Secondary | ICD-10-CM

## 2016-05-28 DIAGNOSIS — J324 Chronic pansinusitis: Secondary | ICD-10-CM | POA: Diagnosis not present

## 2016-05-28 DIAGNOSIS — J329 Chronic sinusitis, unspecified: Secondary | ICD-10-CM

## 2016-05-28 DIAGNOSIS — J343 Hypertrophy of nasal turbinates: Secondary | ICD-10-CM | POA: Insufficient documentation

## 2016-05-28 HISTORY — DX: Hypertrophy of nasal turbinates: J34.3

## 2016-05-28 HISTORY — DX: Other allergic rhinitis: J30.89

## 2016-05-28 HISTORY — DX: Deviated nasal septum: J34.2

## 2016-05-28 HISTORY — DX: Chronic sinusitis, unspecified: J32.9

## 2016-06-30 DIAGNOSIS — J322 Chronic ethmoidal sinusitis: Secondary | ICD-10-CM | POA: Diagnosis not present

## 2016-06-30 DIAGNOSIS — Z79899 Other long term (current) drug therapy: Secondary | ICD-10-CM | POA: Diagnosis not present

## 2016-06-30 DIAGNOSIS — E785 Hyperlipidemia, unspecified: Secondary | ICD-10-CM | POA: Diagnosis not present

## 2016-06-30 DIAGNOSIS — R634 Abnormal weight loss: Secondary | ICD-10-CM | POA: Diagnosis not present

## 2016-06-30 DIAGNOSIS — Z0001 Encounter for general adult medical examination with abnormal findings: Secondary | ICD-10-CM | POA: Diagnosis not present

## 2016-06-30 DIAGNOSIS — A310 Pulmonary mycobacterial infection: Secondary | ICD-10-CM | POA: Diagnosis not present

## 2016-06-30 DIAGNOSIS — R05 Cough: Secondary | ICD-10-CM | POA: Diagnosis not present

## 2016-06-30 DIAGNOSIS — Z1389 Encounter for screening for other disorder: Secondary | ICD-10-CM | POA: Diagnosis not present

## 2016-07-22 ENCOUNTER — Telehealth: Payer: Self-pay | Admitting: Internal Medicine

## 2016-07-22 NOTE — Telephone Encounter (Signed)
Outside labs reviewed: 06/30/16 - > IgE 245   Dr. Kalman Shan, M.D., [facility].C.P Pulmonary and Critical Care Medicine Staff Physician [facility] [place] [facility] Critical [facility] Pager: [phone], If no answer or between  15:00h - 7:00h: call [phone]  07/22/2016 5:14 AM

## 2016-08-11 ENCOUNTER — Encounter: Payer: Self-pay | Admitting: Internal Medicine

## 2016-08-11 ENCOUNTER — Ambulatory Visit (INDEPENDENT_AMBULATORY_CARE_PROVIDER_SITE_OTHER): Payer: Medicare Other | Admitting: Internal Medicine

## 2016-08-11 DIAGNOSIS — J439 Emphysema, unspecified: Secondary | ICD-10-CM

## 2016-08-11 DIAGNOSIS — A310 Pulmonary mycobacterial infection: Secondary | ICD-10-CM | POA: Diagnosis not present

## 2016-08-11 DIAGNOSIS — R768 Other specified abnormal immunological findings in serum: Secondary | ICD-10-CM | POA: Insufficient documentation

## 2016-08-11 HISTORY — DX: Other specified abnormal immunological findings in serum: R76.8

## 2016-08-11 MED ORDER — TIOTROPIUM BROMIDE MONOHYDRATE 1.25 MCG/ACT IN AERS: 2.0000 | INHALATION_SPRAY | Freq: Every day | RESPIRATORY_TRACT | 0 refills | Status: DC

## 2016-08-11 NOTE — Assessment & Plan Note (Signed)
Continue observation.

## 2016-08-11 NOTE — Progress Notes (Signed)
Subjective:     Patient ID: Erin Black, female   DOB: [birth year], 66 y.o.   MRN: [id number]  HPI    OV 06/20/2015  Chief Complaint  Patient presents with  . Follow-up    Pt had induced sputum in 111/2016 - Saw Dr Ninetta Lights in 04/2015 and he stopped all 3 antibiotics -occas dry hacky cough - very little clear sputum - Denies sob, wheezing or chest discomfort    66 year old female with rheumatoid arthritis and was on methotrexate. 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 Presenting symptom was dyspnea in the background of baseline mild chronic cough. She finished 11 months of treatment. We could not get an induced sputum. I referred her to infectious diseases Dr. Ninetta Lights and based on clinical grounds he stopped MAI treatment. She is now here for follow-up. She tells me that with MAI treatment only thing that improved with the dyspnea. The baseline mild chronic cough never really changed. She still has it. She rates it as a 4 out of 10. It is associated with significant sinus drainage. She tells me that she uses Flonase for her sinus drainage [place] but even then she has copious sinus drainage all year around. It is perennial. At this point in time she just wants to have simple conservative measures. She does not want referral to ENT or allergy for this.    OV 02/21/2016  Chief Complaint  Patient presents with  . Follow-up    Pt c/o increase in prod coug with little mucus production light yellow. Pt states she was 10 days of abx, 3 days pred, and IM injection of Kenlog. Pt states she received this becuase of her acute sinusitis.Pt states she has had rocky mtn spotted fever since being seen last.     66 year old with RA. In feb 2016 dx with  MAI with bronch following only symptom of dyspnea (Did have previous baseline mild cough that was unchanged but no sputum or fever). After this and methotrexate was stopped and she was started on MAI treatment. Dyspnea resolved with this.  Based on mild cough did not change. Baseline significant sinus drainage did not change. She finished 11 months of treatment and the end of 2016 early 2017 infectious diseases stop her treatment. When I saw in February 2017 she was feeling fine other than her baseline sinus drainage and cough. She comes in for office visit today she tells me that in August 2017 she ended up with fever, chills, right upper quadrant pain, night sweats cough worsening. She denies any tick bites. However 7/10 days into the illness she was diagnosed approximately one spotted fever based on blood tests and was given 10 days of doxycycline. Around the same was also given Nexium which he broke out in a rash around her abdomen. She denies this was shingles. Subsequent to the doxycycline her symptoms resolved except that she had persistent cough and sinus drainage. This was no worse than baseline. It was also progressive after initial improvement. Subsequently approximately 9 days ago primary care physician gave her 4 days of steroids and 10 days of Omnicef. She is now on the ninth of Omnicef. Towards the last day of Omnicef. She is beginning to feel better with the sinus drainage and cough but still much worse than baseline. In addition last night she had a fever of 102. She is now worried that her MAI is back. She is not on any rheumatoid arthritis treatment and she feels is  under control  Exhaled nitric oxide is slightly elevated at 33 ppb  PLAN 1. CT chest no change in MAI nodulaoirty compated to 2015 2. There is mild emphysema on CT - basis: prior smoking 3. Rt ethmodi sinus partially opacified  Plan  - try low dose symbicort or breo or dulera (hher feno was slightly high) - for cough  - daily netti pot and nasal steroid  - +/- refer ENT  - if she wishes   - I prefer to do above simplistic plan before rushing off to trying MAI rx ag  OV 05/13/2016  Chief Complaint  Patient presents with  . Follow-up    Pt c/o prod  cough with green mucus, sinus congestion with white mucus, increase in SOB x weeks. Pt denies f/c/s.     A 66 year old female with history of MAI infection 2015 associated with presence of emphysema on CT chest October 2017 and recurrent sinus infection  Here for follow-up. In the last month or so she's had persistent sinus congestion associated with green sputum. The last few weeks it is worse. She is now on 5 days of Levaquin and is only partially resolved. She is having green sputum. At last visit because of the presence of emphysema in October 2017 I gave her Symbicort which did not help at a baseline state and is currently helping. Even though she is still significantly symptomatic with postnasal drainage and chest congestion. She feels it is descending into her chest. She is frustrated by constant sinus drainage and recurrent sinusitis. She has never seen ENT. CT sinus in October 2017 did show partially opacified right ethmoid sinus. sHe is also wondering about the fact if her MAI has recurred    OV 08/11/2016  Chief Complaint  Patient presents with  . Follow-up    Pt states her breathing is at baseline. Pt c/o PND, sinus congestion, cough with little mucus production with clear mucus. Pt states she had a sinus infection in March and her PCP gave her 30 days of Augmentin. Pt states she saw ENT, Dr. Jearld Fenton.     66 year old female with history of MAI infection 2015 manifested as dyspnea associated with presence of emphysema on CT chest October 2017 and recurrent sinusitis  After last visit in October 2017 at which time she was having significant sinus issues I referred her to ENT. Review of the note by ENT Dr. Jearld Fenton. The concern is that she might have allergy rhin as opposed to an infection. She was then referred to primary care physician for allergy evaluation. Blood test reviewed from March 2018 and shows elevated IgE in the records and trace positive Aspergillus antibody. She is again denying  any asthma symptoms. She is only bothered by symptoms and sinus congestion despite taking Singulair and Augmentin. Her voice is changing because of the nasal twang. She'll follow-up again with Dr. Jearld Fenton if she has a flareup. In terms of her MAI symptoms October 2017 CT chest did show some infiltrates consistent with MAI as opposed ABPA. She is not having much of pulmonary symptoms of shortness of breath or cough. At this point in time she will be content with observation expectant follow-up.     has a past medical history of Anxiety disorder; Congestive heart failure (HCC); DJD (degenerative joint disease); Essential hypertension; Hearing loss; NSTEMI (non-ST elevated myocardial infarction) [facility]) (Nov 2015); Osteopenia; PVC's (premature ventricular contractions); RA (rheumatoid arthritis) (HCC); Takotsubo cardiomyopathy; and UTI (lower urinary tract infection).   reports that she  quit smoking about 18 years ago. Her smoking use included Cigarettes. She has a 25.00 pack-year smoking history. She has never used smokeless tobacco.  Past Surgical History:  Procedure Laterality Date  . bilateral tubal ligation    . LUMBAR SPINE SURGERY    . strabismus repair    . TONSILLECTOMY AND ADENOIDECTOMY    . VIDEO BRONCHOSCOPY Bilateral 05/17/2014   Procedure: VIDEO BRONCHOSCOPY WITHOUT FLUORO;  Surgeon: Storm Frisk, MD;  Location: [facility];  Service: Cardiopulmonary;  Laterality: Bilateral;    Allergies  Allergen Reactions  . Yellow Jacket Venom [Bee Venom] Anaphylaxis    Hives/swelling  . Omnicef [Cefdinir]     rash  . Plavix [Clopidogrel Bisulfate]     rash    Immunization History  Administered Date(s) Administered  . Influenza, High Dose Seasonal PF 01/22/2016  . Influenza-Unspecified 01/26/2014, 01/16/2015  . Pneumococcal Conjugate-13 02/19/2015    Family History  Problem Relation Age of Onset  . Emphysema Mother   . Heart disease Mother   . Prostate cancer Father   . Lung  cancer Sister      Current Outpatient Prescriptions:  .  aspirin 81 MG tablet, Take 81 mg by mouth daily., Disp: , Rfl:  .  atorvastatin (LIPITOR) 80 MG tablet, Take 80 mg by mouth. Take 1/2 tablet daily by mouth., Disp: , Rfl:  .  budesonide-formoterol (SYMBICORT) 80-4.5 MCG/ACT inhaler, Inhale 2 puffs into the lungs 2 (two) times daily., Disp: 1 Inhaler, Rfl: 0 .  clonazePAM (KLONOPIN) 0.5 MG tablet, Take 0.5 mg by mouth daily. , Disp: , Rfl:  .  EPINEPHrine (EPIPEN 2-PAK) 0.3 mg/0.3 mL IJ SOAJ injection, Inject 0.3 mg into the muscle as needed. , Disp: , Rfl:  .  metoprolol tartrate (LOPRESSOR) 25 MG tablet, Take 25 mg by mouth. Take 1/2 tablet to 1 tablet depending on BP at bedtime by mouth., Disp: , Rfl: 6 .  nitroGLYCERIN (NITROSTAT) 0.4 MG SL tablet, Place 0.4 mg under the tongue every 5 (five) minutes as needed for chest pain., Disp: , Rfl:  .  tiZANidine (ZANAFLEX) 4 MG tablet, Take 4 mg by mouth every 8 (eight) hours as needed for muscle spasms., Disp: , Rfl:    Review of Systems     Objective:   Physical Exam Vitals:   08/11/16 1013  BP: 112/70  Pulse: 90  SpO2: 97%  Weight: 95 lb 12.8 oz (43.5 kg)  Height: 5\' 4"  (1.626 m)    Estimated body mass index is 16.44 kg/m as calculated from the following:   Height as of this encounter: 5\' 4"  (1.626 m).   Weight as of this encounter: 95 lb 12.8 oz (43.5 kg).   Post nasal drip + Clear lung fields Lean bmi    Assessment:     ICD-9-CM ICD-10-CM   1. 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 Pulmonary emphysema, unspecified emphysema type (HCC) 492.8 J43.9   4. 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 ; Pos FOB cultures Currently relatively asymptomatic from pulm standpoint   Plan Repeat ct  chest wo contrast oct 2018 - 1 year followup  MAI (mycobacterium avium-intracellulare) (HCC) Continue observation   Pulmonary emphysema (HCC) Instead of symibcort try inhaled anticholinergic sample Call back in few weeks to see if  this making a difference or not  Elevated IgE level Is possible this and mild aspergillus antibody positive I=s causing allergic rhinitis Though this can cause lung problems too such as asthma/abpa currently no evidence of that  Plan Continue to follow Dr Jearld Fenton advice   > 50% of this > 25 min visit spent in face to face counseling or coordination of care   Dr. Kalman Shan, M.D., [facility].C.P Pulmonary and Critical Care Medicine Staff Physician [facility] [place] [facility]monary and Critical Care Pager: [phone], If no answer or between  15:00h - 7:00h: call [phone]  08/11/2016 10:38 AM

## 2016-08-11 NOTE — Assessment & Plan Note (Signed)
Currently relatively asymptomatic from pulm standpoint   Plan Repeat ct chest wo contrast oct 2018 - 1 year followup

## 2016-08-11 NOTE — Assessment & Plan Note (Signed)
Instead of symibcort try inhaled anticholinergic sample Call us back in few weeks to see if this making a difference or not

## 2016-08-11 NOTE — Patient Instructions (Signed)
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 ; Pos FOB cultures Currently relatively asymptomatic from pulm standpoint   Plan Repeat ct chest wo contrast oct 2018 - 1 year followup  MAI (mycobacterium avium-intracellulare) (HCC) Continue observation   Pulmonary emphysema (HCC) Instead of symibcort try inhaled anticholinergic sample Call us back in few weeks to see if this making a difference or not  Elevated IgE level Is possible this and mild aspergillus antibody positive I=s causing allergic rhinitis Though this can cause lung problems too such as asthma/abpa currently no evidence of that  Plan Continue to follow Dr Jearld Fenton advice   Followup 6 months or sooner if needed but after ct

## 2016-08-11 NOTE — Addendum Note (Signed)
Addended by: Maurene Capes on: 08/11/2016 10:52 AM   Modules accepted: Orders

## 2016-08-11 NOTE — Assessment & Plan Note (Signed)
Is possible this and mild aspergillus antibody positive I=s causing allergic rhinitis Though this can cause lung problems too such as asthma/abpa currently no evidence of that  Plan Continue to follow Dr Jearld Fenton advice

## 2016-09-08 DIAGNOSIS — M270 Developmental disorders of jaws: Secondary | ICD-10-CM | POA: Diagnosis not present

## 2016-09-19 DIAGNOSIS — M25511 Pain in right shoulder: Secondary | ICD-10-CM | POA: Diagnosis not present

## 2016-09-19 DIAGNOSIS — M542 Cervicalgia: Secondary | ICD-10-CM | POA: Diagnosis not present

## 2016-09-19 DIAGNOSIS — M50821 Other cervical disc disorders at C4-C5 level: Secondary | ICD-10-CM | POA: Diagnosis not present

## 2016-09-19 DIAGNOSIS — M50321 Other cervical disc degeneration at C4-C5 level: Secondary | ICD-10-CM | POA: Diagnosis not present

## 2016-09-19 DIAGNOSIS — M50822 Other cervical disc disorders at C5-C6 level: Secondary | ICD-10-CM | POA: Diagnosis not present

## 2016-10-09 DIAGNOSIS — M7501 Adhesive capsulitis of right shoulder: Secondary | ICD-10-CM | POA: Diagnosis not present

## 2016-10-23 ENCOUNTER — Telehealth: Payer: Self-pay | Admitting: Cardiology

## 2016-10-23 NOTE — Telephone Encounter (Signed)
Closed encounter °

## 2016-11-18 ENCOUNTER — Ambulatory Visit: Payer: Medicare Other | Admitting: Cardiology

## 2016-11-19 DIAGNOSIS — M7501 Adhesive capsulitis of right shoulder: Secondary | ICD-10-CM | POA: Diagnosis not present

## 2016-12-03 ENCOUNTER — Ambulatory Visit: Payer: Medicare Other | Admitting: Cardiology

## 2016-12-09 DIAGNOSIS — H25813 Combined forms of age-related cataract, bilateral: Secondary | ICD-10-CM | POA: Diagnosis not present

## 2016-12-11 DIAGNOSIS — I429 Cardiomyopathy, unspecified: Secondary | ICD-10-CM

## 2016-12-11 HISTORY — DX: Cardiomyopathy, unspecified: I42.9

## 2016-12-14 NOTE — Progress Notes (Signed)
Cardiology Office Note:    Date:  12/15/2016   ID:  Chaeli Judy, DOB 12-Dec-1950, MRN [id number]  PCP:  Ernestene Kiel, MD  Cardiologist:  Shirlee More, MD    Referring MD: Ernestene Kiel, MD    ASSESSMENT:    1. Essential hypertension   2. Hypotension, postural   3. Syncope, unspecified syncope type   4. Hyperlipidemia, unspecified hyperlipidemia type    PLAN:    In order of problems listed above:   1. Stable continue her beta blocker 2. Stable she has no orthostatic change in blood pressure today 3. Stable no clinical recurrence 4. Stable continue her statin recent labs requested from her PCP   Next appointment: 6 months   Medication Adjustments/Labs and Tests Ordered: Current medicines are reviewed at length with the patient today.  Concerns regarding medicines are outlined above.  Orders Placed This Encounter  Procedures  . EKG 12-Lead   No orders of the defined types were placed in this encounter.   Chief Complaint  Patient presents with  . Follow-up    Routine flup appt  . Palpitations    History of Present Illness:    Nancy Arvin is a 66 y.o. female with a hx of STEMI with normal coronary angiography, frequent PVC's orthostatic hypotension syncope, Dyslipidemia, and MAC pulmonary infection last seen in January 2018.Marland Kitchen Compliance with diet, lifestyle and medications: yes Past Medical History:  Diagnosis Date  . Anxiety disorder   . 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 Bronchiectasis without complication ([place])   . Chronic cough 06/20/2015  . Congestive heart failure ([place])   . DJD (degenerative joint disease)   . Elevated IgE level 08/11/2016  . Episode of syncope 10/31/2014  . Essential hypertension   . Hearing loss   . History of MAI infection 02/21/2016  . Hx of non-ST elevation myocardial infarction  (NSTEMI) 04/11/2014  . 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 NSTEMI (non-ST elevated myocardial infarction) [facility]) Nov 2015  . Osteopenia   . Other acute recurrent sinusitis 05/13/2016  . Post-nasal drip 06/20/2015  . Pulmonary emphysema ([place]) 05/13/2016  . PVC's (premature ventricular contractions)   . RA (rheumatoid arthritis) ([place])   . Rheumatoid arthritis involving multiple sites ([place])   . Takotsubo cardiomyopathy   . UTI (lower urinary tract infection)     Past Surgical History:  Procedure Laterality Date  . bilateral tubal ligation    . CARDIAC CATHETERIZATION    . LUMBAR SPINE SURGERY    . strabismus repair    . TONSILLECTOMY AND ADENOIDECTOMY    . VIDEO BRONCHOSCOPY Bilateral 05/17/2014   Procedure: VIDEO BRONCHOSCOPY WITHOUT FLUORO;  Surgeon: Elsie Stain, MD;  Location: [facility];  Service: Cardiopulmonary;  Laterality: Bilateral;    Current Medications: Current Meds  Medication Sig  . aspirin 81 MG tablet Take 81 mg by mouth daily.  Marland Kitchen atorvastatin (LIPITOR) 80 MG tablet Take 80 mg by mouth. Take 1/2 tablet daily by mouth.  Marland Kitchen  clonazePAM (KLONOPIN) 0.5 MG tablet Take 0.5 mg by mouth daily.   Marland Kitchen EPINEPHrine (EPIPEN 2-PAK) 0.3 mg/0.3 mL IJ SOAJ injection Inject 0.3 mg into the muscle as needed.   . metoprolol tartrate (LOPRESSOR) 25 MG tablet Take 25 mg by mouth. Take 1/2 tablet to 1 tablet depending on BP at bedtime by mouth.  . nitroGLYCERIN (NITROSTAT) 0.4 MG SL tablet Place 0.4 mg under the  tongue every 5 (five) minutes as needed for chest pain.  Marland Kitchen tiotropium (SPIRIVA HANDIHALER) 18 MCG inhalation capsule Place 18 mcg into inhaler and inhale daily.  Marland Kitchen tiZANidine (ZANAFLEX) 4 MG tablet Take 4 mg by mouth every 8 (eight) hours as needed for muscle spasms.     Allergies:   Yellow jacket venom [bee venom]; Omnicef [cefdinir]; and Plavix [clopidogrel bisulfate]   Social History   Social History  . Marital status: Married    Spouse name: N/A  . Number of children: N/A  . Years of education: N/A   Occupational History  . RN [employer]   Social History Main Topics  . Smoking status: Former Smoker    Packs/day: 1.00    Years: 25.00    Types: Cigarettes    Quit date: 04/28/1998  . Smokeless tobacco: Never Used  . Alcohol use 0.0 oz/week     Comment: 1 drink weekly  . Drug use: No  . Sexual activity: Not Asked   Other Topics Concern  . None   Social History Narrative  . None     Family History: The patient's family history includes Emphysema in her mother; Heart disease in her mother; Lung cancer in her mother and sister; Prostate cancer in her father. ROS:   Please see the history of present illness.    All other systems reviewed and are negative.  EKGs/Labs/Other Studies Reviewed:    The following studies were reviewed today:  EKG:  EKG ordered today.  The ekg ordered today demonstrates [place] normal  Recent Labs: No results found for requested labs within last 8760 hours.  Recent Lipid Panel No results found for: CHOL, TRIG, HDL, CHOLHDL, VLDL, LDLCALC, LDLDIRECT  Physical Exam:    VS:  BP 112/78 (BP Location: Right Arm, Patient Position: Sitting)   Pulse 76   Ht _0  (1.626 m)   Wt 91 lb 1.9 oz (41.3 kg)   SpO2 97%   BMI 15.64 kg/m     Wt Readings from Last 3 Encounters:  12/15/16 91 lb 1.9 oz (41.3 kg)  08/11/16 95 lb 12.8 oz (43.5 kg)  05/13/16 93 lb 6.4 oz (42.4 kg)     GEN:  Well nourished, well developed in no acute  distress HEENT: Normal NECK: No JVD; No carotid bruits LYMPHATICS: No lymphadenopathy CARDIAC: RRR, no murmurs, rubs, gallops RESPIRATORY:  Clear to auscultation without rales, wheezing or rhonchi  ABDOMEN: Soft, non-tender, non-distended MUSCULOSKELETAL:  No edema; No deformity  SKIN: Warm and dry NEUROLOGIC:  Alert and oriented x 3 PSYCHIATRIC:  Normal affect    Signed, Shirlee More, MD  12/15/2016 9:54 AM    [place]

## 2016-12-15 ENCOUNTER — Ambulatory Visit (INDEPENDENT_AMBULATORY_CARE_PROVIDER_SITE_OTHER): Payer: Medicare Other | Admitting: Cardiology

## 2016-12-15 ENCOUNTER — Encounter: Payer: Self-pay | Admitting: Cardiology

## 2016-12-15 VITALS — BP 112/78 | HR 76 | Ht 64.0 in | Wt 91.1 lb

## 2016-12-15 DIAGNOSIS — I10 Essential (primary) hypertension: Secondary | ICD-10-CM | POA: Diagnosis not present

## 2016-12-15 DIAGNOSIS — I951 Orthostatic hypotension: Secondary | ICD-10-CM | POA: Diagnosis not present

## 2016-12-15 DIAGNOSIS — R55 Syncope and collapse: Secondary | ICD-10-CM | POA: Diagnosis not present

## 2016-12-15 DIAGNOSIS — E785 Hyperlipidemia, unspecified: Secondary | ICD-10-CM

## 2016-12-15 NOTE — Patient Instructions (Signed)

## 2017-01-01 DIAGNOSIS — I10 Essential (primary) hypertension: Secondary | ICD-10-CM | POA: Diagnosis not present

## 2017-01-01 DIAGNOSIS — D692 Other nonthrombocytopenic purpura: Secondary | ICD-10-CM | POA: Diagnosis not present

## 2017-01-01 DIAGNOSIS — Z79899 Other long term (current) drug therapy: Secondary | ICD-10-CM | POA: Diagnosis not present

## 2017-01-01 DIAGNOSIS — E785 Hyperlipidemia, unspecified: Secondary | ICD-10-CM | POA: Diagnosis not present

## 2017-02-03 DIAGNOSIS — H25812 Combined forms of age-related cataract, left eye: Secondary | ICD-10-CM | POA: Diagnosis not present

## 2017-02-03 DIAGNOSIS — Z01818 Encounter for other preprocedural examination: Secondary | ICD-10-CM | POA: Diagnosis not present

## 2017-02-09 DIAGNOSIS — Z23 Encounter for immunization: Secondary | ICD-10-CM | POA: Diagnosis not present

## 2017-02-10 ENCOUNTER — Ambulatory Visit (INDEPENDENT_AMBULATORY_CARE_PROVIDER_SITE_OTHER)
Admission: RE | Admit: 2017-02-10 | Discharge: 2017-02-10 | Disposition: A | Discharge status: Home / Self Care | Payer: Medicare Other | Source: Ambulatory Visit | Attending: Internal Medicine | Admitting: Internal Medicine

## 2017-02-10 DIAGNOSIS — A310 Pulmonary mycobacterial infection: Secondary | ICD-10-CM | POA: Diagnosis not present

## 2017-02-10 DIAGNOSIS — J439 Emphysema, unspecified: Secondary | ICD-10-CM | POA: Diagnosis not present

## 2017-02-16 ENCOUNTER — Ambulatory Visit (INDEPENDENT_AMBULATORY_CARE_PROVIDER_SITE_OTHER): Payer: Medicare Other | Admitting: Internal Medicine

## 2017-02-16 ENCOUNTER — Encounter: Payer: Self-pay | Admitting: Internal Medicine

## 2017-02-16 ENCOUNTER — Other Ambulatory Visit (INDEPENDENT_AMBULATORY_CARE_PROVIDER_SITE_OTHER): Payer: Medicare Other

## 2017-02-16 VITALS — BP 110/68 | HR 85 | Ht 64.0 in | Wt 92.4 lb

## 2017-02-16 DIAGNOSIS — J329 Chronic sinusitis, unspecified: Secondary | ICD-10-CM | POA: Diagnosis not present

## 2017-02-16 DIAGNOSIS — Z8739 Personal history of other diseases of the musculoskeletal system and connective tissue: Secondary | ICD-10-CM | POA: Diagnosis not present

## 2017-02-16 DIAGNOSIS — J479 Bronchiectasis, uncomplicated: Secondary | ICD-10-CM

## 2017-02-16 HISTORY — DX: Personal history of other diseases of the musculoskeletal system and connective tissue: Z87.39

## 2017-02-16 LAB — SEDIMENTATION RATE: SED RATE: 5 mm/h (ref 0–30)

## 2017-02-16 MED ORDER — TIOTROPIUM BROMIDE MONOHYDRATE 1.25 MCG/ACT IN AERS: 2.0000 | INHALATION_SPRAY | Freq: Every day | RESPIRATORY_TRACT | 0 refills | Status: DC

## 2017-02-16 NOTE — Patient Instructions (Addendum)
ICD-10-CM   1. Bronchiectasis without complication ([place]) E47.9   2. Chronic sinusitis, unspecified location J32.9   3. History of rheumatoid arthritis Z87.39     Bronchiectasis from MAI stable in CT x 1 year; maybe improvd a bit No further CT for the broncheictasis Use spiriva as needed - take sample  Unclear reason for sinusitis Rule out autoimmune and vasculitis - Serum: Check ESR, ACE, ANA, DS-DNA, RF, anti-CCP, ssA, ssB, scl-70, ANCA screen, MPO, PR-3, Total CK,  RNP, Aldolase,  Cataract surgery -ok for surgery as long as auotimmune results are ok  Followup - will call with blood work in 1-2 weeks  - return in 4-53month

## 2017-02-16 NOTE — Addendum Note (Signed)
Addended by: Wyvonne Lenz on: 02/16/2017 05:09 PM   Modules accepted: Orders

## 2017-02-16 NOTE — Progress Notes (Signed)
Subjective:     Patient ID: Erin Black, female   DOB: July 20, 1950, 66 y.o.   MRN: [id number]  HPI     OV 06/20/2015  Chief Complaint  Patient presents with  . Follow-up    Pt had induced sputum in 111/2016 - Saw Dr [name] in 04/2015 and he stopped all 3 antibiotics -occas dry hacky cough - very little clear sputum - Denies sob, wheezing or chest discomfort    66 year old female with rheumatoid arthritis and was on methotrexate. 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 Presenting symptom was dyspnea in the background of baseline mild chronic cough. She finished 11 months of treatment. We could not get an induced sputum. I referred her to infectious diseases Dr. [name] and based on clinical grounds he stopped MAI treatment. She is now here for follow-up. She tells me that with MAI treatment only thing that improved with the dyspnea. The baseline mild chronic cough never really changed. She still has it. She rates it as a 4 out of 10. It is associated with significant sinus drainage. She tells me that she uses Flonase for her sinus drainage [place] but even then she has copious sinus drainage all year around. It is perennial. At this point in time she just wants to have simple conservative measures. She does not want referral to ENT or allergy for this.    OV 02/21/2016  Chief Complaint  Patient presents with  . Follow-up    Pt c/o increase in prod coug with little mucus production light yellow. Pt states she was 10 days of abx, 3 days pred, and IM injection of [place]. Pt states she received this becuase of her acute sinusitis.Pt states she has had rocky mtn spotted fever since being seen last.     66 year old with RA. In feb 2016 dx with  MAI with bronch following only symptom of dyspnea (Did have previous baseline mild cough that was unchanged but no sputum or fever). After this and methotrexate was stopped and she was started on MAI treatment. Dyspnea resolved with this.  Based on mild cough did not change. Baseline significant sinus drainage did not change. She finished 11 months of treatment and the end of 2016 early 2017 infectious diseases stop her treatment. When I saw in February 2017 she was feeling fine other than her baseline sinus drainage and cough. She comes in for office visit today she tells me that in August 2017 she ended up with fever, chills, right upper quadrant pain, night sweats cough worsening. She denies any tick bites. However 7/10 days into the illness she was diagnosed approximately one spotted fever based on blood tests and was given 10 days of doxycycline. Around the same was also given Nexium which he broke out in a rash around her abdomen. She denies this was shingles. Subsequent to the doxycycline her symptoms resolved except that she had persistent cough and sinus drainage. This was no worse than baseline. It was also progressive after initial improvement. Subsequently approximately 9 days ago primary care physician gave her 4 days of steroids and 10 days of Omnicef. She is now on the ninth of [place]. Towards the last day of Omnicef. She is beginning to feel better with the sinus drainage and cough but still much worse than baseline. In addition last night she had a fever of 102. She is now worried that her MAI is back. She is not on any rheumatoid arthritis treatment and she feels  is under control  Exhaled nitric oxide is slightly elevated at 33 ppb  PLAN 1. CT chest no change in MAI nodulaoirty compated to 2015 2. There is mild emphysema on CT - basis: prior smoking 3. Rt ethmodi sinus partially opacified  Plan  - try low dose symbicort or breo or dulera (hher feno was slightly high) - for cough  - daily netti pot and nasal steroid  - +/- refer ENT  - if she wishes   - I prefer to do above simplistic plan before rushing off to trying MAI rx ag  OV 05/13/2016  Chief Complaint  Patient presents with  . Follow-up    Pt c/o prod  cough with green mucus, sinus congestion with white mucus, increase in SOB x weeks. Pt denies f/c/s.     A 66 year old female with history of MAI infection 2015 associated with presence of emphysema on CT chest October 2017 and recurrent sinus infection  Here for follow-up. In the last month or so she's had persistent sinus congestion associated with green sputum. The last few weeks it is worse. She is now on 5 days of Levaquin and is only partially resolved. She is having green sputum. At last visit because of the presence of emphysema in October 2017 I gave her Symbicort which did not help at a baseline state and is currently helping. Even though she is still significantly symptomatic with postnasal drainage and chest congestion. She feels it is descending into her chest. She is frustrated by constant sinus drainage and recurrent sinusitis. She has never seen ENT. CT sinus in October 2017 did show partially opacified right ethmoid sinus. sHe is also wondering about the fact if her MAI has recurred    OV 08/11/2016  Chief Complaint  Patient presents with  . Follow-up    Pt states her breathing is at baseline. Pt c/o PND, sinus congestion, cough with little mucus production with clear mucus. Pt states she had a sinus infection in March and her PCP gave her 30 days of Augmentin. Pt states she saw ENT, Dr. Janace Hoard.     67 year old female with history of MAI infection 2015 manifested as dyspnea associated with presence of emphysema on CT chest October 2017 and recurrent sinusitis  After last visit in October 2017 at which time she was having significant sinus issues I referred her to ENT. Review of the note by ENT Dr. Janace Hoard. The concern is that she might have allergy rhin as opposed to an infection. She was then referred to primary care physician for allergy evaluation. Blood test reviewed from March 2018 and shows elevated IgE in the records and trace positive Aspergillus antibody. She is again denying  any asthma symptoms. She is only bothered by symptoms and sinus congestion despite taking Singulair and Augmentin. Her voice is changing because of the nasal twang. She'll follow-up again with Dr. Janace Hoard if she has a flareup. In terms of her MAI symptoms October 2017 CT chest did show some infiltrates consistent with MAI as opposed ABPA. She is not having much of pulmonary symptoms of shortness of breath or cough. At this point in time she will be content with observation expectant follow-up.   OV 02/16/2017  Chief Complaint  Patient presents with  . Follow-up    CT done 02/10/17.  Pt states that she has been doing good except she has had some sinus congestion due to allergies. C/o occ. cough and SOB especially when she has a flare up with her  allergies and sinuses. Denies any CP. Pt states that her BP got as low as 60/40 Thursday morning, 13/39.   66 year old female with history of MI associated with mild bronchiectasis and recurrent sinusitis   Last visit was in April 2018 at the time because of mild emphysema on the CT scan with Spiriva. She only uses this as needed. She says it helps with the dyspnea and cough when she has a severe bout of cough associated with sinus congestion. She says that shortness of breath does not bother her as much. She wants to use his Spiriva as needed. She is asking for samples. Her main issues that she is still bothered by sinus congestion. She says that Dr. Janace Hoard is labile this is allergic rhinitis. But she seems very consumed by her chronic sinus congestion and postnasal drip and the cough associated with this. She says despite all these instructions from ENT Dr. Janace Hoard she has not improved at all and it is very frustrating. She denies any vascular disease but she did admit to a history of rheumatoid arthritis diagnosed sometime around 2008-2010. Is unclear what treatment she is on. Off note she is scheduled for cataract surgery and she wants to know if it is okay to do  this from a cough standpoint  IMPRESSION: 1. No acute cardiopulmonary abnormalities. 2. Similar appearance of peribronchovascular nodularity, mucoid impaction and volume loss compatible with mycobacterium avium complex. 3. Aortic Atherosclerosis (ICD10-I70.0) and Emphysema (ICD10-J43.9). Coronary artery calcifications noted in the LAD.   Electronically Signed   By: Kerby Moors M.D.   On: 02/10/2017 13:57    has a past medical history of Anxiety disorder; 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); Bronchiectasis without complication ([place]); Chronic cough (06/20/2015); Congestive heart failure ([place]); DJD (degenerative joint disease); 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/11/2016); Episode of syncope (10/31/2014); Essential hypertension; Hearing loss; History of MAI infection (02/21/2016); non-ST elevation myocardial infarction (NSTEMI) (04/11/2014); Hypotension, 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); MAI (mycobacterium avium-intracellulare) ([place]) (02/19/2015); 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 ; Pos FOB cultures (04/11/2014); NSTEMI (non-ST elevated myocardial infarction) [facility]) (Nov 2015); Osteopenia; Other acute recurrent sinusitis (05/13/2016); Post-nasal drip (06/20/2015); Pulmonary emphysema ([place]) (05/13/2016); PVC's (premature ventricular contractions); RA (rheumatoid arthritis) ([place]); Rheumatoid arthritis involving multiple sites [facility]); Takotsubo cardiomyopathy; and UTI (lower urinary tract infection).   reports that she quit smoking about 18 years ago. Her smoking use included Cigarettes. She has a 25.00 pack-year smoking history. She has never used smokeless tobacco.  Past Surgical History:  Procedure Laterality Date  . bilateral tubal ligation    . CARDIAC CATHETERIZATION    . LUMBAR SPINE SURGERY    . strabismus repair    . TONSILLECTOMY AND ADENOIDECTOMY    . VIDEO BRONCHOSCOPY Bilateral 05/17/2014   Procedure: VIDEO BRONCHOSCOPY WITHOUT FLUORO;  Surgeon: Elsie Stain,  MD;  Location: [facility];  Service: Cardiopulmonary;  Laterality: Bilateral;    Allergies  Allergen Reactions  . Yellow Jacket Venom [Bee Venom] Anaphylaxis    Hives/swelling  . Omnicef [Cefdinir]     rash  . Plavix [Clopidogrel Bisulfate]     rash    Immunization History  Administered Date(s) Administered  . Influenza, High Dose Seasonal PF 01/22/2016  . Influenza-Unspecified 01/26/2014, 01/16/2015, 02/09/2017  . Pneumococcal Conjugate-13 02/19/2015    Family History  Problem Relation Age of Onset  . Emphysema Mother   . Heart disease Mother   . Lung cancer Mother   . Prostate cancer Father   . Lung cancer Sister  Current Outpatient Prescriptions:  .  aspirin 81 MG tablet, Take 81 mg by mouth daily., Disp: , Rfl:  .  atorvastatin (LIPITOR) 80 MG tablet, Take 80 mg by mouth. Take 1/2 tablet daily by mouth., Disp: , Rfl:  .  clonazePAM (KLONOPIN) 0.5 MG tablet, Take 0.5 mg by mouth daily. , Disp: , Rfl:  .  EPINEPHrine (EPIPEN 2-PAK) 0.3 mg/0.3 mL IJ SOAJ injection, Inject 0.3 mg into the muscle as needed. , Disp: , Rfl:  .  loratadine (CLARITIN) 10 MG tablet, Take 10 mg by mouth daily as needed for allergies., Disp: , Rfl:  .  metoprolol tartrate (LOPRESSOR) 25 MG tablet, Take 25 mg by mouth. Take 1/2 tablet to 1 tablet depending on BP at bedtime by mouth., Disp: , Rfl: 6 .  nitroGLYCERIN (NITROSTAT) 0.4 MG SL tablet, Place 0.4 mg under the tongue every 5 (five) minutes as needed for chest pain., Disp: , Rfl:  .  tiotropium (SPIRIVA HANDIHALER) 18 MCG inhalation capsule, Place 18 mcg into inhaler and inhale daily., Disp: , Rfl:  .  tiZANidine (ZANAFLEX) 4 MG tablet, Take 4 mg by mouth at bedtime. , Disp: , Rfl:  .  triamcinolone (NASACORT ALLERGY 24HR) 55 MCG/ACT AERO nasal inhaler, Place 2 sprays into the nose as needed., Disp: , Rfl:    Review of Systems     Objective:   Physical Exam  Constitutional: She is oriented to person, place, and time. She appears  well-developed and well-nourished. No distress.  HENT:  Head: Normocephalic and atraumatic.  Right Ear: External ear normal.  Left Ear: External ear normal.  Mouth/Throat: Oropharynx is clear and moist. No oropharyngeal exudate.  Mild postnasal drip  Eyes: Pupils are equal, round, and reactive to light. Conjunctivae and EOM are normal. Right eye exhibits no discharge. Left eye exhibits no discharge. No scleral icterus.  Neck: Normal range of motion. Neck supple. No JVD present. No tracheal deviation present. No thyromegaly present.  Cardiovascular: Normal rate, regular rhythm, normal heart sounds and intact distal pulses.  Exam reveals no gallop and no friction rub.   No murmur heard. Pulmonary/Chest: Effort normal and breath sounds normal. No respiratory distress. She has no wheezes. She has no rales. She exhibits no tenderness.  Abdominal: Soft. Bowel sounds are normal. She exhibits no distension and no mass. There is no tenderness. There is no rebound and no guarding.  Musculoskeletal: Normal range of motion. She exhibits no edema or tenderness.  Lymphadenopathy:    She has no cervical adenopathy.  Neurological: She is alert and oriented to person, place, and time. She has normal reflexes. No cranial nerve deficit. She exhibits normal muscle tone. Coordination normal.  Skin: Skin is warm and dry. No rash noted. She is not diaphoretic. No erythema. No pallor.  Psychiatric: She has a normal mood and affect. Her behavior is normal. Judgment and thought content normal.  Vitals reviewed.   Vitals:   02/16/17 1557  BP: 110/68  Pulse: 85  SpO2: 100%  Weight: 92 lb 6.4 oz (41.9 kg)  Height: '5\' 4"'  (1.626 m)    Estimated body mass index is 15.86 kg/m as calculated from the following:   Height as of this encounter: '5\' 4"'  (1.626 m).   Weight as of this encounter: 92 lb 6.4 oz (41.9 kg).     Assessment:       ICD-10-CM   1. Bronchiectasis without complication ([place]) D66.4   2. Chronic  sinusitis, unspecified location J32.9   3. History  of rheumatoid arthritis Z87.39        Plan:      Bronchiectasis from MAI stable in CT x 1 year; maybe improvd a bit No further CT for the broncheictasis Use spiriva as needed - take sample  Unclear reason for sinusitis Rule out autoimmune and vasculitis - Serum: Check ESR, ACE, ANA, DS-DNA, RF, anti-CCP, ssA, ssB, scl-70, ANCA screen, MPO, PR-3, Total CK,  RNP, Aldolase,  Cataract surgery -ok for surgery as long as auotimmune results are ok  Followup - will call with blood work in 1-2 weeks  - return in 4-68month  > 50% of this > 25 min visit spent in face to face counseling or coordination of care    Dr. MBrand Males M.D., FEnd[facility]state[facility].P Pulmonary and Critical Care Medicine Staff Physician C[facility]elopePulmonary and Critical Care Pager: [phone] If no answer or between  15:00h - 7:00h: call [phone]  02/16/2017 4:31 PM

## 2017-02-16 NOTE — Addendum Note (Signed)
Addended by: Wyvonne Lenz on: 02/16/2017 04:37 PM   Modules accepted: Orders

## 2017-02-17 LAB — RNP ANTIBODIES: ENA RNP Ab: 0.4 AI (ref 0.0–0.9)

## 2017-02-18 LAB — ANGIOTENSIN CONVERTING ENZYME: Angiotensin-Converting Enzyme: 43 U/L (ref 9–67)

## 2017-02-18 LAB — CK TOTAL AND CKMB (NOT AT ARMC)
CK, MB: 1 ng/mL (ref 0–5.0)
Relative Index: 1.4 (ref 0–4.0)
Total CK: 74 U/L (ref 29–143)

## 2017-02-18 LAB — ANTI-DNA ANTIBODY, DOUBLE-STRANDED: ds DNA Ab: <1 [IU]/mL

## 2017-02-18 LAB — ALDOLASE: Aldolase: 5 U/L (ref <=8.1)

## 2017-02-18 LAB — SJOGREN'S SYNDROME ANTIBODS(SSA + SSB)
SSA (Ro) (ENA) Antibody, IgG: <1 AI
SSB (La) (ENA) Antibody, IgG: <1 AI

## 2017-02-18 LAB — CYCLIC CITRUL PEPTIDE ANTIBODY, IGG

## 2017-02-18 LAB — RHEUMATOID FACTOR: Rheumatoid fact SerPl-aCnc: <14 [IU]/mL

## 2017-02-18 LAB — ANA: Anti Nuclear Antibody(ANA): NEGATIVE

## 2017-02-18 LAB — ANTI-SCLERODERMA ANTIBODY: Scleroderma (Scl-70) (ENA) Antibody, IgG: <1 AI

## 2017-02-18 LAB — ANCA SCREEN W REFLEX TITER: ANCA Screen: NEGATIVE

## 2017-02-23 ENCOUNTER — Ambulatory Visit: Payer: Medicare Other | Admitting: Internal Medicine

## 2017-02-23 ENCOUNTER — Telehealth: Payer: Self-pay | Admitting: Internal Medicine

## 2017-02-23 NOTE — Telephone Encounter (Signed)
Patient is calling for results [phone].

## 2017-02-23 NOTE — Telephone Encounter (Signed)
Spoke with pt and notified of results per Dr. Ramaswamy. Pt verbalized understanding and denied any questions.  

## 2017-02-23 NOTE — Telephone Encounter (Signed)
Let Erin Black know that autoimmune and vasculitis workup negative - done for recurrent sinusitis  Dr. Kalman Shan, M.D., [facility].C.P Pulmonary and Critical Care Medicine Staff Physician [facility] Pulmonary and Critical Care Pager: [phone], If no answer or between  15:00h - 7:00h: call [phone]  02/23/2017 9:54 AM

## 2017-02-27 DIAGNOSIS — R05 Cough: Secondary | ICD-10-CM | POA: Diagnosis not present

## 2017-02-27 DIAGNOSIS — J309 Allergic rhinitis, unspecified: Secondary | ICD-10-CM | POA: Diagnosis not present

## 2017-02-27 DIAGNOSIS — L739 Follicular disorder, unspecified: Secondary | ICD-10-CM | POA: Diagnosis not present

## 2017-03-02 DIAGNOSIS — H25812 Combined forms of age-related cataract, left eye: Secondary | ICD-10-CM | POA: Diagnosis not present

## 2017-03-02 DIAGNOSIS — H2512 Age-related nuclear cataract, left eye: Secondary | ICD-10-CM | POA: Diagnosis not present

## 2017-03-03 DIAGNOSIS — R21 Rash and other nonspecific skin eruption: Secondary | ICD-10-CM | POA: Diagnosis not present

## 2017-03-09 DIAGNOSIS — H25811 Combined forms of age-related cataract, right eye: Secondary | ICD-10-CM | POA: Diagnosis not present

## 2017-03-09 DIAGNOSIS — H2511 Age-related nuclear cataract, right eye: Secondary | ICD-10-CM | POA: Diagnosis not present

## 2017-03-12 ENCOUNTER — Other Ambulatory Visit: Payer: Self-pay

## 2017-03-12 MED ORDER — METOPROLOL TARTRATE 25 MG PO TABS: 12.5000 mg | ORAL_TABLET | Freq: Two times a day (BID) | ORAL | 3 refills | Status: DC

## 2017-04-08 DIAGNOSIS — H524 Presbyopia: Secondary | ICD-10-CM | POA: Diagnosis not present

## 2017-05-05 DIAGNOSIS — J300 Vasomotor rhinitis: Secondary | ICD-10-CM | POA: Diagnosis not present

## 2017-05-05 DIAGNOSIS — R05 Cough: Secondary | ICD-10-CM | POA: Diagnosis not present

## 2017-05-05 DIAGNOSIS — Z91030 Bee allergy status: Secondary | ICD-10-CM | POA: Diagnosis not present

## 2017-05-05 DIAGNOSIS — J309 Allergic rhinitis, unspecified: Secondary | ICD-10-CM | POA: Diagnosis not present

## 2017-05-13 DIAGNOSIS — K219 Gastro-esophageal reflux disease without esophagitis: Secondary | ICD-10-CM | POA: Diagnosis not present

## 2017-05-13 DIAGNOSIS — J342 Deviated nasal septum: Secondary | ICD-10-CM | POA: Diagnosis not present

## 2017-05-13 DIAGNOSIS — J343 Hypertrophy of nasal turbinates: Secondary | ICD-10-CM | POA: Diagnosis not present

## 2017-05-13 DIAGNOSIS — J310 Chronic rhinitis: Secondary | ICD-10-CM | POA: Diagnosis not present

## 2017-06-12 ENCOUNTER — Other Ambulatory Visit: Payer: Self-pay

## 2017-06-12 ENCOUNTER — Telehealth: Payer: Self-pay | Admitting: Cardiology

## 2017-06-12 MED ORDER — ATORVASTATIN CALCIUM 80 MG PO TABS: 80.0000 mg | ORAL_TABLET | Freq: Every day | ORAL | 3 refills | Status: DC

## 2017-06-12 NOTE — Telephone Encounter (Signed)
Clarified prescription is to be filled for atorvastatin 0.5 tablet (40 mg) daily.

## 2017-06-12 NOTE — Telephone Encounter (Signed)
Please call Zack regarding a script clarification

## 2017-06-16 ENCOUNTER — Ambulatory Visit (INDEPENDENT_AMBULATORY_CARE_PROVIDER_SITE_OTHER): Payer: Medicare Other | Admitting: Internal Medicine

## 2017-06-16 ENCOUNTER — Encounter: Payer: Self-pay | Admitting: Internal Medicine

## 2017-06-16 VITALS — BP 114/64 | HR 80 | Ht 64.0 in | Wt 97.0 lb

## 2017-06-16 DIAGNOSIS — R053 Chronic cough: Secondary | ICD-10-CM

## 2017-06-16 DIAGNOSIS — I2510 Atherosclerotic heart disease of native coronary artery without angina pectoris: Secondary | ICD-10-CM

## 2017-06-16 DIAGNOSIS — R05 Cough: Secondary | ICD-10-CM | POA: Diagnosis not present

## 2017-06-16 DIAGNOSIS — J387 Other diseases of larynx: Secondary | ICD-10-CM | POA: Diagnosis not present

## 2017-06-16 DIAGNOSIS — J329 Chronic sinusitis, unspecified: Secondary | ICD-10-CM | POA: Diagnosis not present

## 2017-06-16 DIAGNOSIS — J479 Bronchiectasis, uncomplicated: Secondary | ICD-10-CM

## 2017-06-16 NOTE — Progress Notes (Signed)
Subjective:     Patient ID: Erin Black, female   DOB: 31-Mar-1951, 67 y.o.   MRN: [id number]  HPI   OV 06/20/2015  Chief Complaint  Patient presents with  . Follow-up    Pt had induced sputum in 111/2016 - Saw Dr Ninetta Lights in 04/2015 and he stopped all 3 antibiotics -occas dry hacky cough - very little clear sputum - Denies sob, wheezing or chest discomfort    67 year old female with rheumatoid arthritis and was on methotrexate. 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 Presenting symptom was dyspnea in the background of baseline mild chronic cough. She finished 11 months of treatment. We could not get an induced sputum. I referred her to infectious diseases Dr. Ninetta Lights and based on clinical grounds he stopped MAI treatment. She is now here for follow-up. She tells me that with MAI treatment only thing that improved with the dyspnea. The baseline mild chronic cough never really changed. She still has it. She rates it as a 4 out of 10. It is associated with significant sinus drainage. She tells me that she uses Flonase for her sinus drainage [place] but even then she has copious sinus drainage all year around. It is perennial. At this point in time she just wants to have simple conservative measures. She does not want referral to ENT or allergy for this.    OV 02/21/2016  Chief Complaint  Patient presents with  . Follow-up    Pt c/o increase in prod coug with little mucus production light yellow. Pt states she was 10 days of abx, 3 days pred, and IM injection of [place]. Pt states she received this becuase of her acute sinusitis.Pt states she has had rocky mtn spotted fever since being seen last.     [name]. In feb 2016 dx with  MAI with bronch following only symptom of dyspnea (Did have previous baseline mild cough that was unchanged but no sputum or fever). After this and methotrexate was stopped and she was started on MAI treatment. Dyspnea resolved with this.  Based on mild cough did not change. Baseline significant sinus drainage did not change. She finished 11 months of treatment and the end of 2016 early 2017 infectious diseases stop her treatment. When I saw in February 2017 she was feeling fine other than her baseline sinus drainage and cough. She comes in for office visit today she tells me that in August 2017 she ended up with fever, chills, right upper quadrant pain, night sweats cough worsening. She denies any tick bites. However 7/10 days into the illness she was diagnosed approximately one spotted fever based on blood tests and was given 10 days of doxycycline. Around the same was also given Nexium which he broke out in a rash around her abdomen. She denies this was shingles. Subsequent to the doxycycline her symptoms resolved except that she had persistent cough and sinus drainage. This was no worse than baseline. It was also progressive after initial improvement. Subsequently approximately 9 days ago primary care physician gave her 4 days of steroids and 10 days of Omnicef. She is now on the ninth of Omnicef. Towards the last day of Omnicef. She is beginning to feel better with the sinus drainage and cough but still much worse than baseline. In addition last night she had a fever of 102. She is now worried that her MAI is back. She is not on any rheumatoid arthritis treatment and she feels is under  control  Exhaled nitric oxide is slightly elevated at 33 ppb  PLAN 1. CT chest no change in MAI nodulaoirty compated to 2015 2. There is mild emphysema on CT - basis: prior smoking 3. Rt ethmodi sinus partially opacified  Plan  - try low dose symbicort or breo or dulera (hher feno was slightly high) - for cough  - daily netti pot and nasal steroid  - +/- refer ENT  - if she wishes   - I prefer to do above simplistic plan before rushing off to trying MAI rx ag  OV 05/13/2016  Chief Complaint  Patient presents with  . Follow-up    Pt c/o prod  cough with green mucus, sinus congestion with white mucus, increase in SOB x weeks. Pt denies f/c/s.     A 67 year old female with history of MAI infection 2015 associated with presence of emphysema on CT chest October 2017 and recurrent sinus infection  Here for follow-up. In the last month or so she's had persistent sinus congestion associated with green sputum. The last few weeks it is worse. She is now on 5 days of Levaquin and is only partially resolved. She is having green sputum. At last visit because of the presence of emphysema in October 2017 I gave her Symbicort which did not help at a baseline state and is currently helping. Even though she is still significantly symptomatic with postnasal drainage and chest congestion. She feels it is descending into her chest. She is frustrated by constant sinus drainage and recurrent sinusitis. She has never seen ENT. CT sinus in October 2017 did show partially opacified right ethmoid sinus. sHe is also wondering about the fact if her MAI has recurred    OV 08/11/2016  Chief Complaint  Patient presents with  . Follow-up    Pt states her breathing is at baseline. Pt c/o PND, sinus congestion, cough with little mucus production with clear mucus. Pt states she had a sinus infection in March and her PCP gave her 30 days of Augmentin. Pt states she saw ENT, Dr. Jearld Fenton.     67 year old female with history of MAI infection 2015 manifested as dyspnea associated with presence of emphysema on CT chest October 2017 and recurrent sinusitis  After last visit in October 2017 at which time she was having significant sinus issues I referred her to ENT. Review of the note by ENT Dr. Jearld Fenton. The concern is that she might have allergy rhin as opposed to an infection. She was then referred to primary care physician for allergy evaluation. Blood test reviewed from March 2018 and shows elevated IgE in the records and trace positive Aspergillus antibody. She is again denying  any asthma symptoms. She is only bothered by symptoms and sinus congestion despite taking Singulair and Augmentin. Her voice is changing because of the nasal twang. She'll follow-up again with Dr. Jearld Fenton if she has a flareup. In terms of her MAI symptoms October 2017 CT chest did show some infiltrates consistent with MAI as opposed ABPA. She is not having much of pulmonary symptoms of shortness of breath or cough. At this point in time she will be content with observation expectant follow-up.   OV 02/16/2017  Chief Complaint  Patient presents with  . Follow-up    CT done 02/10/17.  Pt states that she has been doing good except she has had some sinus congestion due to allergies. C/o occ. cough and SOB especially when she has a flare up with her allergies and  sinuses. Denies any CP. Pt states that her BP got as low as 60/40 Thursday morning, 25/38.   67 year old female with history of MAI associated with mild bronchiectasis and recurrent sinusitis   Last visit was in April 2018 at the time because of mild emphysema on the CT scan with Spiriva. She only uses this as needed. She says it helps with the dyspnea and cough when she has a severe bout of cough associated with sinus congestion. She says that shortness of breath does not bother her as much. She wants to use his Spiriva as needed. She is asking for samples. Her main issues that she is still bothered by sinus congestion. She says that Dr. Jearld Fenton is labeled this is allergic rhinitis. But she seems very consumed by her chronic sinus congestion and postnasal drip and the cough associated with this. She says despite all these instructions from ENT Dr. Jearld Fenton she has not improved at all and it is very frustrating. She denies any vascular disease but she did admit to a history of rheumatoid arthritis diagnosed sometime around 2008-2010. Is unclear what treatment she is on. Off note she is scheduled for cataract surgery and she wants to know if it is okay to do  this from a cough standpoint  IMPRESSION: 1. No acute cardiopulmonary abnormalities. 2. Similar appearance of peribronchovascular nodularity, mucoid impaction and volume loss compatible with mycobacterium avium complex. 3. Aortic Atherosclerosis (ICD10-I70.0) and Emphysema (ICD10-J43.9). Coronary artery calcifications noted in the LAD.   Electronically Signed   By: Signa Kell M.D.   On: 02/10/2017 13:57   OV 06/16/2017  Chief Complaint  Patient presents with  . Follow-up    Pt states she has seen multiple doctors due to issues with allergies. Pt does have an occ. cough from postnasal drainage and has SOB when coughing a lot. Pt stated Dr. Suszanne Conners said symptoms are from acid reflux.   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  Previous MAI infection with treatment and CT scan October 2018 with improvement.  Has chronic sinusitis  Since my last visit with her several months ago she says she has seen allergist Dr. Haysi Callas.  Had extensive allergy testing was negative but nevertheless placed on Singulair.  Subsequently sent to second ENT opinion by Dr. Jodean Lima.  ENT exam apparently showed suggestive signs of acid reflux placed on Prilosec therapy.  Despite this chronic nasal congestion continues.  She is blowing out a lot of mucus in the morning.  In addition she is clearing the throat a lot.  She also has a cough.  She says the symptoms do not wake her up in the middle of the night at all.  Symptoms are present only in the daytime.  There is no chest pain.  Her voice feels hoarse and she has a ticklish sensation.      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   reports that she quit smoking about 19 years ago. Her smoking use included cigarettes. She has a 25.00 pack-year smoking history. she has never used smokeless tobacco.  Past Surgical History:  Procedure Laterality Date  . bilateral tubal ligation    . CARDIAC CATHETERIZATION    . LUMBAR SPINE SURGERY    . strabismus repair    . TONSILLECTOMY AND ADENOIDECTOMY    . VIDEO BRONCHOSCOPY Bilateral 05/17/2014   Procedure: VIDEO BRONCHOSCOPY WITHOUT FLUORO;  Surgeon: Storm Frisk, MD;  Location: [facility];  Service: Cardiopulmonary;  Laterality: Bilateral;    Allergies  Allergen Reactions  . Yellow Jacket Venom [Bee Venom] Anaphylaxis    Hives/swelling  . Omnicef [Cefdinir]     rash  . Plavix [Clopidogrel Bisulfate]     rash    Immunization History  Administered Date(s) Administered  . Influenza, High Dose Seasonal PF 01/22/2016  . Influenza-Unspecified 01/26/2014, 01/16/2015, 02/09/2017  . Pneumococcal Conjugate-13 02/19/2015    Family History  Problem Relation Age of Onset  . Emphysema Mother   . Heart disease Mother   . Lung cancer Mother   . Prostate cancer Father   . Lung cancer Sister      Current Outpatient Medications:  .  aspirin 81 MG tablet, Take 81 mg by mouth daily., Disp: , Rfl:  .  atorvastatin (LIPITOR) 80 MG tablet,  Take 1 tablet (80 mg total) by mouth daily at 6 PM. Take 1/2 tablet daily by mouth., Disp: 45 tablet, Rfl: 3 .  cetirizine (ZYRTEC) 10 MG tablet, Take 10 mg by mouth daily., Disp: , Rfl: 4 .  clonazePAM (KLONOPIN) 0.5 MG tablet, Take 0.5 mg by mouth daily. , Disp: , Rfl:  .  fluticasone (FLONASE) 50 MCG/ACT nasal spray, Place 1 spray into both nostrils daily., Disp: , Rfl:  .  ipratropium (ATROVENT) 0.06 % nasal spray, instill 2 sprays into each nostril twice daily as needed for drainage, Disp: , Rfl: 5 .  metoprolol tartrate (LOPRESSOR) 25 MG tablet, Take 0.5 tablets (12.5 mg total) 2 (two) times daily by mouth. Hold if SBP <120 mm Hg, Disp: 90 tablet, Rfl: 3 .  montelukast (SINGULAIR) 10 MG tablet, Take by mouth., Disp: , Rfl:  .  omeprazole (PRILOSEC) 20 MG capsule, , Disp: , Rfl:  .  tiotropium (SPIRIVA HANDIHALER) 18 MCG inhalation capsule, Place 18 mcg into inhaler and inhale daily., Disp: , Rfl:  .  tiZANidine (ZANAFLEX) 4 MG tablet, Take 4 mg by mouth at bedtime. , Disp: , Rfl:  .  EPINEPHrine (EPIPEN 2-PAK) 0.3 mg/0.3 mL IJ SOAJ injection, Inject 0.3 mg into the muscle as needed. , Disp: , Rfl:  .  nitroGLYCERIN (NITROSTAT) 0.4 MG SL tablet, Place 0.4 mg under the tongue every 5 (five) minutes as needed for chest pain., Disp: , Rfl:     Review of Systems     Objective:   Physical Exam  Constitutional: She is oriented to person, place, and time. She appears well-developed and well-nourished. No distress.  HENT:  Head: Normocephalic and atraumatic.  Right Ear: External ear normal.  Left Ear: External ear normal.  Mouth/Throat: Oropharynx is clear and moist. No oropharyngeal exudate.  Mild post nasal drip Clears throat Nasally voice  Eyes: Conjunctivae and EOM are normal. Pupils are equal, round, and reactive to light. Right eye exhibits no discharge. Left eye exhibits no discharge. No scleral icterus.  Neck: Normal range of motion. Neck supple. No JVD present. No tracheal  deviation present. No thyromegaly present.  Cardiovascular: Normal rate, regular rhythm, normal heart sounds and intact distal pulses. Exam reveals no gallop and no friction rub.  No murmur heard. Pulmonary/Chest: Effort normal and breath sounds normal. No respiratory distress. She has no wheezes. She has no rales. She exhibits no tenderness.  Abdominal: Soft. Bowel sounds are normal. She exhibits no distension and no mass. There is no tenderness. There is no rebound and no guarding.  Musculoskeletal: Normal range of motion. She exhibits no edema or tenderness.  Lymphadenopathy:    She has no cervical adenopathy.  Neurological: She is alert and oriented to person, place, and time. She has normal reflexes. No cranial nerve deficit. She exhibits normal muscle tone. Coordination normal.  Skin: Skin is warm and dry. No rash noted. She is not diaphoretic. No erythema. No pallor.  Psychiatric: She has a normal mood and affect. Her behavior is normal. Judgment and thought content normal.  Vitals reviewed.  Vitals:   06/16/17 0957  BP: 114/64  Pulse: 80  SpO2: 97%  Weight: 97 lb (44 kg)  Height: 5\' 4"  (1.626 m)    Estimated body mass index is 16.65 kg/m as calculated from the following:   Height as of this encounter: 5\' 4"  (1.626 m).   Weight as of this encounter: 97 lb (44 kg).     Assessment:       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 Chronic cough R05   3. Bronchiectasis without complication (HCC) J47.9   4. Chronic sinusitis, unspecified location J32.9   5. 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 Kalman Shan, M.D., [facility].C.P Pulmonary and Critical Care Medicine Staff Physician, [facility] Center Director - Interstitial Lung Disease  Program  Pulmonary Fibrosis [facility] at [facility] [place], [place], [address]  Pager: [phone], If no answer or between  15:00h - 7:00h: call [phone] Telephone: [phone]

## 2017-06-16 NOTE — Patient Instructions (Addendum)
Irritable larynx Chronic cough Bronchiectasis without complication (HCC) Chronic sinusitis, unspecified location   I think a lot of what is causing your symptoms is cough neuropathy aka irritable larynx  Plan Continue ENT care and advised from allergist on medicattion Recommend voice rehab + 2-4 month trial of gabapentin; please checkw ith Dr Dallie Piles if he is supportive Take article on cough neuropathy   #coronary artery calcification  - seen CT Oct 2018 - if no normal cardiac stress test past few years I recommend referrak to cardiologist    #followup - call us [phone] after d/w Dr Dallie Piles; to consider gabapentin - otherwise followup in 6 months

## 2017-06-24 DIAGNOSIS — R490 Dysphonia: Secondary | ICD-10-CM | POA: Diagnosis not present

## 2017-06-24 DIAGNOSIS — J310 Chronic rhinitis: Secondary | ICD-10-CM | POA: Diagnosis not present

## 2017-06-24 DIAGNOSIS — R0982 Postnasal drip: Secondary | ICD-10-CM | POA: Diagnosis not present

## 2017-06-24 DIAGNOSIS — M7501 Adhesive capsulitis of right shoulder: Secondary | ICD-10-CM | POA: Diagnosis not present

## 2017-06-24 DIAGNOSIS — J342 Deviated nasal septum: Secondary | ICD-10-CM | POA: Diagnosis not present

## 2017-06-24 DIAGNOSIS — K219 Gastro-esophageal reflux disease without esophagitis: Secondary | ICD-10-CM | POA: Diagnosis not present

## 2017-07-01 ENCOUNTER — Telehealth: Payer: Self-pay | Admitting: Cardiology

## 2017-07-01 DIAGNOSIS — I493 Ventricular premature depolarization: Secondary | ICD-10-CM | POA: Diagnosis not present

## 2017-07-01 DIAGNOSIS — E785 Hyperlipidemia, unspecified: Secondary | ICD-10-CM | POA: Diagnosis not present

## 2017-07-01 DIAGNOSIS — Z79899 Other long term (current) drug therapy: Secondary | ICD-10-CM | POA: Diagnosis not present

## 2017-07-01 DIAGNOSIS — J300 Vasomotor rhinitis: Secondary | ICD-10-CM | POA: Diagnosis not present

## 2017-07-01 DIAGNOSIS — J387 Other diseases of larynx: Secondary | ICD-10-CM | POA: Diagnosis not present

## 2017-07-01 DIAGNOSIS — I10 Essential (primary) hypertension: Secondary | ICD-10-CM | POA: Diagnosis not present

## 2017-07-01 DIAGNOSIS — I2510 Atherosclerotic heart disease of native coronary artery without angina pectoris: Secondary | ICD-10-CM | POA: Diagnosis not present

## 2017-07-01 NOTE — Telephone Encounter (Signed)
Patient wanted doctor to know she had a CT of her lungs in October and they saw coronary arterial calcification

## 2017-07-01 NOTE — Telephone Encounter (Signed)
CT chest in Epic for review.

## 2017-07-01 NOTE — Telephone Encounter (Signed)
Nor uncommon, I would not do further tests at this time

## 2017-07-01 NOTE — Telephone Encounter (Signed)
Patient advised stable result, no further testing needed at this time. Patient verbalized understanding. No further questions.

## 2017-07-20 ENCOUNTER — Telehealth: Payer: Self-pay | Admitting: Internal Medicine

## 2017-07-20 ENCOUNTER — Telehealth: Payer: Self-pay | Admitting: Cardiology

## 2017-07-20 NOTE — Telephone Encounter (Signed)
Spoke with patient. She is in the process of looking at other supplemental Medicare plans. One of the questions being asked is if the patient has had any lung disorders within the last 2 years.   Advised patient that per chart, she has bronchiectasis and emphysema, therefore the answer would be yes.   She verbalized understanding. Nothing else needed at time of call.

## 2017-07-20 NOTE — Telephone Encounter (Signed)
Patient is applying for health insurance and wants to knowif she is considered a heart patient in the last 24 months.  All her cardiac episode occurred before 2015 and has been great since then.  She didn't feel comfortable filling out application without asking [place] first.

## 2017-07-21 NOTE — Telephone Encounter (Signed)
Please advise 

## 2017-07-21 NOTE — Telephone Encounter (Signed)
Left message to return call 

## 2017-07-21 NOTE — Telephone Encounter (Signed)
Please call patient on cell phone she had to go into work.

## 2017-07-21 NOTE — Telephone Encounter (Signed)
Patient advised to use her best judgement per Dr. Dulce Sellar. Patient verbalized understanding, no further questions.

## 2017-10-07 DIAGNOSIS — H04123 Dry eye syndrome of bilateral lacrimal glands: Secondary | ICD-10-CM | POA: Diagnosis not present

## 2017-10-07 DIAGNOSIS — H26493 Other secondary cataract, bilateral: Secondary | ICD-10-CM | POA: Diagnosis not present

## 2017-11-26 ENCOUNTER — Other Ambulatory Visit: Payer: Self-pay

## 2017-12-16 DIAGNOSIS — Z1231 Encounter for screening mammogram for malignant neoplasm of breast: Secondary | ICD-10-CM | POA: Diagnosis not present

## 2017-12-16 DIAGNOSIS — Z1389 Encounter for screening for other disorder: Secondary | ICD-10-CM | POA: Diagnosis not present

## 2017-12-16 DIAGNOSIS — Z0001 Encounter for general adult medical examination with abnormal findings: Secondary | ICD-10-CM | POA: Diagnosis not present

## 2017-12-16 DIAGNOSIS — Z1331 Encounter for screening for depression: Secondary | ICD-10-CM | POA: Diagnosis not present

## 2017-12-16 DIAGNOSIS — E2839 Other primary ovarian failure: Secondary | ICD-10-CM | POA: Diagnosis not present

## 2017-12-29 DIAGNOSIS — E785 Hyperlipidemia, unspecified: Secondary | ICD-10-CM | POA: Diagnosis not present

## 2017-12-29 DIAGNOSIS — Z79899 Other long term (current) drug therapy: Secondary | ICD-10-CM | POA: Diagnosis not present

## 2018-01-12 ENCOUNTER — Ambulatory Visit (INDEPENDENT_AMBULATORY_CARE_PROVIDER_SITE_OTHER): Payer: Medicare Other | Admitting: Internal Medicine

## 2018-01-12 ENCOUNTER — Encounter: Payer: Self-pay | Admitting: Internal Medicine

## 2018-01-12 VITALS — BP 122/66 | HR 88 | Ht 64.0 in | Wt 99.2 lb

## 2018-01-12 DIAGNOSIS — J387 Other diseases of larynx: Secondary | ICD-10-CM | POA: Diagnosis not present

## 2018-01-12 DIAGNOSIS — J329 Chronic sinusitis, unspecified: Secondary | ICD-10-CM | POA: Diagnosis not present

## 2018-01-12 DIAGNOSIS — I2510 Atherosclerotic heart disease of native coronary artery without angina pectoris: Secondary | ICD-10-CM | POA: Diagnosis not present

## 2018-01-12 DIAGNOSIS — J479 Bronchiectasis, uncomplicated: Secondary | ICD-10-CM | POA: Diagnosis not present

## 2018-01-12 DIAGNOSIS — R05 Cough: Secondary | ICD-10-CM | POA: Diagnosis not present

## 2018-01-12 DIAGNOSIS — Z23 Encounter for immunization: Secondary | ICD-10-CM

## 2018-01-12 DIAGNOSIS — R059 Cough, unspecified: Secondary | ICD-10-CM

## 2018-01-12 NOTE — Progress Notes (Signed)
OV 06/20/2015  Chief Complaint  Patient presents with  . Follow-up    Pt had induced sputum in 111/2016 - Saw Dr Ninetta Lights in 04/2015 and he stopped all 3 antibiotics -occas dry hacky cough - very little clear sputum - Denies sob, wheezing or chest discomfort    67 year old female with rheumatoid arthritis and was on methotrexate. 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 Presenting symptom was dyspnea in the background of baseline mild chronic cough. She finished 11 months of treatment. We could not get an induced sputum. I referred her to infectious diseases Dr. Ninetta Lights and based on clinical grounds he stopped MAI treatment. She is now here for follow-up. She tells me that with MAI treatment only thing that improved with the dyspnea. The baseline mild chronic cough never really changed. She still has it. She rates it as a 4 out of 10. It is associated with significant sinus drainage. She tells me that she uses Flonase for her sinus drainage [place] but even then she has copious sinus drainage all year around. It is perennial. At this point in time she just wants to have simple conservative measures. She does not want referral to ENT or allergy for this.    OV 02/21/2016  Chief Complaint  Patient presents with  . Follow-up    Pt c/o increase in prod coug with little mucus production light yellow. Pt states she was 10 days of abx, 3 days pred, and IM injection of [place]. Pt states she received this becuase of her acute sinusitis.Pt states she has had rocky mtn spotted fever since being seen last.     [name]. In feb 2016 dx with  MAI with bronch following only symptom of dyspnea (Did have previous baseline mild cough that was unchanged but no sputum or fever). After this and methotrexate was stopped and she was started on MAI treatment. Dyspnea resolved with this. Based on mild cough did not change. Baseline significant sinus drainage did not change. She finished 11  months of treatment and the end of 2016 early 2017 infectious diseases stop her treatment. When I saw in February 2017 she was feeling fine other than her baseline sinus drainage and cough. She comes in for office visit today she tells me that in August 2017 she ended up with fever, chills, right upper quadrant pain, night sweats cough worsening. She denies any tick bites. However 7/10 days into the illness she was diagnosed approximately one spotted fever based on blood tests and was given 10 days of doxycycline. Around the same was also given Nexium which he broke out in a rash around her abdomen. She denies this was shingles. Subsequent to the doxycycline her symptoms resolved except that she had persistent cough and sinus drainage. This was no worse than baseline. It was also progressive after initial improvement. Subsequently approximately 9 days ago primary care physician gave her 4 days of steroids and 10 days of Omnicef. She is now on the ninth of Omnicef. Towards the last day of Omnicef. She is beginning to feel better with the sinus drainage and cough but still much worse than baseline. In addition last night she had a fever of 102. She is now worried that her MAI is back. She is not on any rheumatoid arthritis treatment and she feels is under control  Exhaled nitric oxide is slightly elevated at 33 ppb  PLAN 1. CT chest no change in MAI nodulaoirty  compated to 2015 2. There is mild emphysema on CT - basis: prior smoking 3. Rt ethmodi sinus partially opacified  Plan  - try low dose symbicort or breo or dulera (hher feno was slightly high) - for cough  - daily netti pot and nasal steroid  - +/- refer ENT  - if she wishes   - I prefer to do above simplistic plan before rushing off to trying MAI rx ag  OV 05/13/2016  Chief Complaint  Patient presents with  . Follow-up    Pt c/o prod cough with green mucus, sinus congestion with white mucus, increase in SOB x weeks. Pt denies f/c/s.      A 67 year old female with history of MAI infection 2015 associated with presence of emphysema on CT chest October 2017 and recurrent sinus infection  Here for follow-up. In the last month or so she's had persistent sinus congestion associated with green sputum. The last few weeks it is worse. She is now on 5 days of Levaquin and is only partially resolved. She is having green sputum. At last visit because of the presence of emphysema in October 2017 I gave her Symbicort which did not help at a baseline state and is currently helping. Even though she is still significantly symptomatic with postnasal drainage and chest congestion. She feels it is descending into her chest. She is frustrated by constant sinus drainage and recurrent sinusitis. She has never seen ENT. CT sinus in October 2017 did show partially opacified right ethmoid sinus. sHe is also wondering about the fact if her MAI has recurred    OV 08/11/2016  Chief Complaint  Patient presents with  . Follow-up    Pt states her breathing is at baseline. Pt c/o PND, sinus congestion, cough with little mucus production with clear mucus. Pt states she had a sinus infection in March and her PCP gave her 30 days of Augmentin. Pt states she saw ENT, Dr. Jearld Fenton.     67 year old female with history of MAI infection 2015 manifested as dyspnea associated with presence of emphysema on CT chest October 2017 and recurrent sinusitis  After last visit in October 2017 at which time she was having significant sinus issues I referred her to ENT. Review of the note by ENT Dr. Jearld Fenton. The concern is that she might have allergy rhin as opposed to an infection. She was then referred to primary care physician for allergy evaluation. Blood test reviewed from March 2018 and shows elevated IgE in the records and trace positive Aspergillus antibody. She is again denying any asthma symptoms. She is only bothered by symptoms and sinus congestion despite taking Singulair  and Augmentin. Her voice is changing because of the nasal twang. She'll follow-up again with Dr. Jearld Fenton if she has a flareup. In terms of her MAI symptoms October 2017 CT chest did show some infiltrates consistent with MAI as opposed ABPA. She is not having much of pulmonary symptoms of shortness of breath or cough. At this point in time she will be content with observation expectant follow-up.   OV 02/16/2017  Chief Complaint  Patient presents with  . Follow-up    CT done 02/10/17.  Pt states that she has been doing good except she has had some sinus congestion due to allergies. C/o occ. cough and SOB especially when she has a flare up with her allergies and sinuses. Denies any CP. Pt states that her BP got as low as 60/40 Thursday morning, 73/61.   67 year old female  with history of MAI associated with mild bronchiectasis and recurrent sinusitis   Last visit was in April 2018 at the time because of mild emphysema on the CT scan with Spiriva. She only uses this as needed. She says it helps with the dyspnea and cough when she has a severe bout of cough associated with sinus congestion. She says that shortness of breath does not bother her as much. She wants to use his Spiriva as needed. She is asking for samples. Her main issues that she is still bothered by sinus congestion. She says that Dr. Jearld Fenton is labeled this is allergic rhinitis. But she seems very consumed by her chronic sinus congestion and postnasal drip and the cough associated with this. She says despite all these instructions from ENT Dr. Jearld Fenton she has not improved at all and it is very frustrating. She denies any vascular disease but she did admit to a history of rheumatoid arthritis diagnosed sometime around 2008-2010. Is unclear what treatment she is on. Off note she is scheduled for cataract surgery and she wants to know if it is okay to do this from a cough standpoint  IMPRESSION: 1. No acute cardiopulmonary abnormalities. 2. 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 3. Aortic Atherosclerosis (ICD10-I70.0) and Emphysema (ICD10-J43.9). Coronary artery calcifications noted in the LAD.   Electronically Signed   By: Signa Kell M.D.   On: 02/10/2017 13:57   OV 06/16/2017  Chief Complaint  Patient presents with  . Follow-up    Pt states she has seen multiple doctors due to issues with allergies. Pt does have an occ. cough from postnasal drainage and has SOB when coughing a lot. Pt stated Dr. Suszanne Conners said symptoms are from acid reflux.   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  Previous MAI infection with treatment and CT scan October 2018 with improvement.  Has chronic sinusitis  Since my last visit with her several months ago she says she has seen allergist Dr. [place] Callas.  Had extensive allergy testing was negative but nevertheless placed on Singulair.  Subsequently sent to second ENT opinion by Dr. Jodean Lima.  ENT exam apparently showed suggestive signs of acid reflux placed on Prilosec therapy.  Despite this chronic nasal congestion continues.  She is blowing out a lot of mucus in the morning.  In addition she is clearing the throat a lot.  She also has a cough.  She says the symptoms do not wake her up in the middle of the night at all.  Symptoms are present only in the daytime.  There is no chest pain.  Her voice feels hoarse and she has a ticklish sensation.    OV 01/12/2018  Subjective:  Patient ID: Ilda Mori, female , DOB: 1951/02/02 , age 48 y.o. , MRN: [id number] , ADDRESS: [address] [place] [address]   01/12/2018 -   Chief Complaint  Patient presents with  . Follow-up    Pt states she has been doing good since last visit. States she is still coughing some.     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  Previous MAI infection with treatment and CT scan October 2018 with improvement.  Has chronic sinusitis  and cough neuroapathy   HPI Makhiya Ficarra 67 y.o. 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 Since last seeing me she went and saw ENT. She was advised voice rehabilitation and gabapentin but she has deferred this. She plans to see a  Rhinologist  at [facility] Dr Hope Pigeon. She continues to have mild sinus drainage andclearing of the throat all unchanged.  no wheezing no shortness of breath. No chest tightness. According to her history she recently saw primary care physician and the physical exam and blood work were normal otherwise. She will have a high dose flu shot today. She smoked heavily in the past quitting in the year 2000. She smoked 1.5 packs per day 30 of 40 years. A sister was diagnosed with lung cancer age of 18.     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) (Nov 2015), Osteopenia, Other acute recurrent sinusitis (05/13/2016), Post-nasal drip (06/20/2015), Pulmonary emphysema (HCC) (05/13/2016), PVC's (premature ventricular contractions), RA (rheumatoid arthritis) (HCC), Rheumatoid arthritis involving multiple sites [facility]), Takotsubo cardiomyopathy, and UTI (lower urinary tract infection).   reports that she quit smoking about 19 years ago. Her smoking use included cigarettes. She has a 25.00 pack-year smoking history. She has never used  smokeless tobacco.  Past Surgical History:  Procedure Laterality Date  . bilateral tubal ligation    . CARDIAC CATHETERIZATION    . LUMBAR SPINE SURGERY    . strabismus repair    . TONSILLECTOMY AND ADENOIDECTOMY    . VIDEO BRONCHOSCOPY Bilateral 05/17/2014   Procedure: VIDEO BRONCHOSCOPY WITHOUT FLUORO;  Surgeon: Storm Frisk, MD;  Location: [facility];  Service: Cardiopulmonary;  Laterality: Bilateral;    Allergies  Allergen Reactions  . Yellow Jacket Venom [Bee Venom] Anaphylaxis    Hives/swelling  . Omnicef [Cefdinir]     rash  . Plavix [Clopidogrel Bisulfate]     rash    Immunization History  Administered Date(s) Administered  . Influenza, High Dose Seasonal PF 01/22/2016  . Influenza-Unspecified 01/26/2014, 01/16/2015, 02/09/2017  . Pneumococcal Conjugate-13 02/19/2015    Family History  Problem Relation Age of Onset  . Emphysema Mother   . Heart disease Mother   . Lung cancer Mother   . Prostate cancer Father   . Lung cancer Sister      Current Outpatient Medications:  .  aspirin 81 MG tablet, Take 81 mg by mouth daily., Disp: , Rfl:  .  atorvastatin (LIPITOR) 40 MG tablet, Take 40 mg by mouth daily., Disp: , Rfl:  .  cetirizine (ZYRTEC) 10 MG tablet, Take 10 mg by mouth daily., Disp: , Rfl: 4 .  clonazePAM (KLONOPIN) 0.5 MG tablet, Take 0.5 mg by mouth daily. , Disp: , Rfl:  .  EPINEPHrine (EPIPEN 2-PAK) 0.3 mg/0.3 mL IJ SOAJ injection, Inject 0.3 mg into the muscle as needed. , Disp: , Rfl:  .  ipratropium (ATROVENT) 0.06 % nasal spray, instill 2 sprays into each nostril twice daily as needed for drainage, Disp: , Rfl: 5 .  metoprolol tartrate (LOPRESSOR) 25 MG tablet, Take 0.5 tablets (12.5 mg total) 2 (two) times daily by mouth. Hold if SBP <120 mm Hg, Disp: 90 tablet, Rfl: 3 .  omeprazole (PRILOSEC) 20 MG capsule, , Disp: , Rfl:  .  tiZANidine (ZANAFLEX) 4 MG tablet, Take 4 mg by mouth at  bedtime. , Disp: , Rfl:  .  nitroGLYCERIN (NITROSTAT) 0.4 MG  SL tablet, Place 0.4 mg under the tongue every 5 (five) minutes as needed for chest pain., Disp: , Rfl:  .  tiotropium (SPIRIVA HANDIHALER) 18 MCG inhalation capsule, Place 18 mcg into inhaler and inhale daily., Disp: , Rfl:       Objective:   Vitals:   01/12/18 0952  BP: 122/66  Pulse: 88  SpO2: 97%  Weight: 99 lb 3.2 oz (45 kg)  Height: 5\' 4"  (1.626 m)    Estimated body mass index is 17.03 kg/m as calculated from the following:   Height as of this encounter: 5\' 4"  (1.626 m).   Weight as of this encounter: 99 lb 3.2 oz (45 kg).  @WEIGHTCHANGE @  [employer]   01/12/18 0952  Weight: 99 lb 3.2 oz (45 kg)     Physical Exam  General Appearance:    Alert, cooperative, no distress, appears stated age - yes , sitting on - chair. thin  Head:    Normocephalic, without obvious abnormality, atraumatic  Eyes:    PERRL, conjunctiva/corneas clear,  Ears:    Normal TM's and external ear canals, both ears  Nose:   Nares normal, septum midline, mucosa normal, no drainage    or sinus tenderness. OXYGEN ON  -  Room air. Patient is @ no   Throat:   Lips, mucosa, tongue normal; teeth and gums normal. Cyanosis on lips - no  Neck:   Supple, symmetrical, trachea midline, no adenopathy;    thyroid:  no enlargement/tenderness/nodules; no carotid   bruit or JVD  Back:     Symmetric, no curvature, ROM normal, no CVA tenderness  Lungs:     Distress - no , Wheeze no, Barrell Chest - no, Purse lip breathing - no, Crackles - no   Chest Wall:    No tenderness or deformity. Scars in chest no   Heart:    Regular rate and rhythm, S1 and S2 normal, no rub   or gallop, Murmur - no  Breast Exam:    NOT DONE  Abdomen:     Soft, non-tender, bowel sounds active all four quadrants,    no masses, no organomegaly  Genitalia:   NOT DONE  Rectal:   NOT DONE  Extremities:   Extremities normal, atraumatic, Clubbing - no, Edema - no  Pulses:   2+ and symmetric all extremities  Skin:   Stigmata of Connective  Tissue Disease - no  Lymph nodes:   Cervical, supraclavicular, and axillary nodes normal  Psychiatric:  Neurologic:   pleasant CNII-XII intact, normal strength, sensation  throughout           Assessment:       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 Chronic sinusitis, unspecified location J32.9   3. Bronchiectasis without complication (HCC) J47.9        Plan:      I think a lot of what is causing your symptoms is cough neuropathy aka irritable larynx  Plan Continue ENT care  Per Dr Dallie Piles and upcoming Dr Hinda Kehr at [facility] deferral for voice rehab and gabapentin give Do CT chest without contrast in 6-9 months (last Oct 2018) as followup for Bronchiectasis Hihg dose flu shot 01/12/2018  Followup 6-9 months or sooner if needed   (> 50% of this 15 min visit spent in face to face counseling or/and coordination of care by this undersigned MD - Dr Carmin Muskrat  Antonia Culbertson. This includes one or more of the following documented above: discussion of test results, diagnostic or treatment recommendations, prognosis, risks and benefits of management options, instructions, education, compliance or risk-factor reduction)      SIGNATURE    Dr. Kalman Shan, M.D., F.C.C.P,  Pulmonary and Critical Care Medicine Staff Physician, [facility] Center Director - Interstitial Lung Disease  Program  Pulmonary Fibrosis [facility] at [facility] [place], [place], [address]  Pager: [phone], If no answer or between  15:00h - 7:00h: call [phone] Telephone: [phone]  10:08 AM 01/12/2018

## 2018-01-12 NOTE — Patient Instructions (Addendum)
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 Chronic sinusitis, unspecified location J32.9   3. Bronchiectasis without complication (HCC) J47.9      I think a lot of what is causing your symptoms is cough neuropathy aka irritable larynx  Plan Continue ENT care  Per Dr Dallie Piles and upcoming Dr Hinda Kehr at [facility] deferral for voice rehab and gabapentin give Do CT chest without contrast in 6-9 months (last Oct 2018) as followup for Bronchiectasis Hihg dose flu shot 01/12/2018  Followup 6-9 months or sooner if needed

## 2018-03-02 DIAGNOSIS — Z1231 Encounter for screening mammogram for malignant neoplasm of breast: Secondary | ICD-10-CM | POA: Diagnosis not present

## 2018-03-11 ENCOUNTER — Other Ambulatory Visit: Payer: Self-pay | Admitting: Cardiology

## 2018-03-15 ENCOUNTER — Telehealth: Payer: Self-pay

## 2018-03-15 MED ORDER — METOPROLOL TARTRATE 25 MG PO TABS: 12.5000 mg | ORAL_TABLET | Freq: Two times a day (BID) | ORAL | 0 refills | Status: DC

## 2018-03-15 NOTE — Addendum Note (Signed)
Addended by: Lamona Curl on: 03/15/2018 10:31 AM   Modules accepted: Orders

## 2018-03-15 NOTE — Telephone Encounter (Signed)
Rx sent to pharmacy   

## 2018-04-08 ENCOUNTER — Other Ambulatory Visit: Payer: Self-pay | Admitting: Cardiology

## 2018-05-06 NOTE — Progress Notes (Signed)
Cardiology Office Note:    Date:  05/07/2018   ID:  Erin Black, DOB May 20, 1950, MRN [id number]  PCP:  Ernestene Kiel, MD  Cardiologist:  Shirlee More, MD    Referring MD: Ernestene Kiel, MD    ASSESSMENT:    1. Hypotension, postural   2. Coronary vasospasm (HCC)   3. Mixed hyperlipidemia    PLAN:    In order of problems listed above:  1. Stable and asymptomatic 2. Stable no recurrence continue medical therapy including aspirin statin nitroglycerin. 3. Stable continue her statin recent lipid profile 12/30/2027 shows a total 128 HDL of 59 LDL clearly less than 70 at target   Next appointment: 1 year   Medication Adjustments/Labs and Tests Ordered: Current medicines are reviewed at length with the patient today.  Concerns regarding medicines are outlined above.  Orders Placed This Encounter  Procedures  . EKG 12-Lead   Meds ordered this encounter  Medications  . metoprolol tartrate (LOPRESSOR) 25 MG tablet    Sig: Take 1 tablet (25 mg total) by mouth at bedtime. (BETA BLOCKER)    Dispense:  90 tablet    Refill:  3    Chief Complaint  Patient presents with  . Follow-up    coronary artery spasm  . Hypotension    History of Present Illness:    Erin Black is a 68 y.o. female with a hx of STEMI with normal coronary angiography in 2015, frequent PVC's orthostatic hypotension syncope, Dyslipidemia, and MAC pulmonary infection  last seen 12/15/16. Compliance with diet, lifestyle and medications: Yes  Overall she is improved she has less cough is off antibiotics for MAI but unfortunately has a chronic cough and is undergoing evaluation for laryngeal etiology advised to take gabapentin worried about side effects but told her case it causes sodium retention and may actually help with her hypotension if she decides to take it.  Coronary vasospasm is stable no chest pain she has had no palpitations syncope or symptomatic hypotension. Past Medical History:    Diagnosis Date  . Anxiety disorder   . 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 Bronchiectasis without complication ([place])   . Chronic cough 06/20/2015  . Congestive heart failure ([place])   . DJD (degenerative joint disease)   . Elevated IgE level 08/11/2016  . Episode of syncope 10/31/2014  . Essential hypertension   . Hearing loss   . History of MAI infection 02/21/2016  . Hx of non-ST elevation myocardial infarction (NSTEMI) 04/11/2014  . 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 NSTEMI (non-ST elevated myocardial infarction) [facility]) Nov 2015  . Osteopenia   . Other acute recurrent sinusitis 05/13/2016  . Post-nasal drip 06/20/2015  . Pulmonary emphysema ([place]) 05/13/2016  . PVC's (premature ventricular contractions)   . RA (rheumatoid arthritis) ([place])   . Rheumatoid arthritis involving multiple sites ([place])   . Takotsubo cardiomyopathy   . UTI (lower urinary tract infection)     Past Surgical History:  Procedure Laterality  Date  . bilateral tubal ligation    . CARDIAC CATHETERIZATION    . LUMBAR SPINE SURGERY    . strabismus repair    . TONSILLECTOMY AND ADENOIDECTOMY    . VIDEO BRONCHOSCOPY Bilateral 05/17/2014   Procedure: VIDEO BRONCHOSCOPY WITHOUT FLUORO;  Surgeon: Elsie Stain, MD;  Location: [facility] END[facility];  Service: Cardiopulmonary;  Laterality: Bilateral;    Current  Medications: Current Meds  Medication Sig  . aspirin 81 MG tablet Take 81 mg by mouth daily.  Marland Kitchen atorvastatin (LIPITOR) 40 MG tablet Take 40 mg by mouth daily.  . cetirizine (ZYRTEC) 10 MG tablet Take 10 mg by mouth daily.  . clonazePAM (KLONOPIN) 0.5 MG tablet Take 0.5 mg by mouth daily.   Marland Kitchen EPINEPHrine (EPIPEN 2-PAK) 0.3 mg/0.3 mL IJ SOAJ injection Inject 0.3 mg into the muscle as needed.   Marland Kitchen ipratropium (ATROVENT) 0.06 % nasal spray instill 2 sprays into each nostril twice daily as needed for drainage  . metoprolol tartrate (LOPRESSOR) 25 MG tablet Take 1 tablet (25 mg total) by mouth at bedtime. (BETA BLOCKER)  . nitroGLYCERIN (NITROSTAT) 0.4 MG SL tablet Place 0.4 mg under the tongue every 5 (five) minutes as needed for chest pain.  Marland Kitchen omeprazole (PRILOSEC) 20 MG capsule Take 20 mg by mouth daily.   Marland Kitchen tiZANidine (ZANAFLEX) 4 MG tablet Take 4 mg by mouth at bedtime.   . [DISCONTINUED] metoprolol tartrate (LOPRESSOR) 25 MG tablet TAKE 1/2 TABLET (12.5 MG) BY MOUTH TWICE DAILY. HOLD IF SYSTOLIC BLOOD PRESSURE IS LESS THAN 120 mmHg. (Patient taking differently: Take 25 mg by mouth at bedtime. (BETA BLOCKER))     Allergies:   Yellow jacket venom [bee venom]; Omnicef [cefdinir]; and Plavix [clopidogrel bisulfate]   Social History   Socioeconomic History  . Marital status: Married    Spouse name: Not on file  . Number of children: Not on file  . Years of education: Not on file  . Highest education level: Not on file  Occupational History  . Occupation: Programmer, multimedia: [employer]  . Financial resource strain: Not on file  . Food insecurity:    Worry: Not on file    Inability: Not on file  . Transportation needs:    Medical: Not on file    Non-medical: Not on file  Tobacco Use  . Smoking status: Former Smoker    Packs/day: 1.00    Years: 25.00    Pack years: 25.00    Types: Cigarettes    Last attempt to quit: 04/28/1998    Years since quitting: 20.0  .  Smokeless tobacco: Never Used  Substance and Sexual Activity  . Alcohol use: Yes    Alcohol/week: 0.0 standard drinks    Comment: 1 drink weekly  . Drug use: No  . Sexual activity: Not on file  Lifestyle  . Physical activity:    Days per week: Not on file    Minutes per session: Not on file  . Stress: Not on file  Relationships  . Social connections:    Talks on phone: Not on file    Gets together: Not on file    Attends religious service: Not on file    Active member of club or organization: Not on file    Attends meetings of clubs or organizations: Not on file    Relationship status: Not on file  Other Topics Concern  . Not on file  Social History Narrative  . Not on  file     Family History: The patient's family history includes Emphysema in her mother; Heart disease in her mother; Lung cancer in her mother and sister; Prostate cancer in her father. ROS:   Please see the history of present illness.    All other systems reviewed and are negative.  EKGs/Labs/Other Studies Reviewed:    The following studies were reviewed today:  EKG:  EKG ordered today.  The ekg ordered today demonstrates sinus rhythm remains normal  Recent Labs: No results found for requested labs within last 8760 hours.  Recent Lipid Panel No results found for: CHOL, TRIG, HDL, CHOLHDL, VLDL, LDLCALC, LDLDIRECT  Physical Exam:    VS:  BP 100/70 (BP Location: Right Arm, Patient Position: Sitting, Cuff Size: Normal)   Pulse 66   Ht _0  (1.626 m)   Wt 99 lb 6 oz (45.1 kg)   SpO2 98%   BMI 17.06 kg/m     Wt Readings from Last 3 Encounters:  05/07/18 99 lb 6 oz (45.1 kg)  01/12/18 99 lb 3.2 oz (45 kg)  06/16/17 97 lb (44 kg)     GEN: She she appears much stronger and is no longer debilitated and weak well nourished, well developed in no acute distress HEENT: Normal NECK: No JVD; No carotid bruits LYMPHATICS: No lymphadenopathy CARDIAC: RRR, no murmurs, rubs, gallops RESPIRATORY:  Clear to  auscultation without rales, wheezing or rhonchi  ABDOMEN: Soft, non-tender, non-distended MUSCULOSKELETAL:  No edema; No deformity  SKIN: Warm and dry NEUROLOGIC:  Alert and oriented x 3 PSYCHIATRIC:  Normal affect    Signed, Shirlee More, MD  05/07/2018 10:39 AM    [place]

## 2018-05-07 ENCOUNTER — Ambulatory Visit (INDEPENDENT_AMBULATORY_CARE_PROVIDER_SITE_OTHER): Payer: Medicare Other | Admitting: Cardiology

## 2018-05-07 ENCOUNTER — Encounter: Payer: Self-pay | Admitting: Cardiology

## 2018-05-07 VITALS — BP 100/70 | HR 66 | Ht 64.0 in | Wt 99.4 lb

## 2018-05-07 DIAGNOSIS — I951 Orthostatic hypotension: Secondary | ICD-10-CM

## 2018-05-07 DIAGNOSIS — I201 Angina pectoris with documented spasm: Secondary | ICD-10-CM

## 2018-05-07 DIAGNOSIS — E782 Mixed hyperlipidemia: Secondary | ICD-10-CM | POA: Diagnosis not present

## 2018-05-07 HISTORY — DX: Angina pectoris with documented spasm: I20.1

## 2018-05-07 MED ORDER — METOPROLOL TARTRATE 25 MG PO TABS: 25.0000 mg | ORAL_TABLET | Freq: Every day | ORAL | 3 refills | Status: DC

## 2018-05-07 NOTE — Patient Instructions (Signed)

## 2018-06-08 ENCOUNTER — Other Ambulatory Visit: Payer: Self-pay | Admitting: Cardiology

## 2018-06-18 ENCOUNTER — Telehealth: Payer: Self-pay | Admitting: Internal Medicine

## 2018-06-18 NOTE — Telephone Encounter (Signed)
I spoke to pt & let her know I will give her the appt info as soon as I have the appt.  Pt verbalized understanding.

## 2018-06-18 NOTE — Telephone Encounter (Signed)
Called and spoke with Patient.  She is requesting to schedule a CT chest.  Ct chest without contrast, was ordered 01/12/18 for 6-9 months. Patient has upcoming OV with Dr Marchelle Gearing 07/13/18, and per OV appt note, OV to go over CT results.    Message routed to [facility]

## 2018-06-18 NOTE — Telephone Encounter (Signed)
Returning call CB# [phone]//[phone]

## 2018-06-18 NOTE — Telephone Encounter (Signed)
Sched for 3/13 @ 11:45, check in by 11:30.  No Prep.  Gave appointment info to pt.  Nothing further needed at this time.

## 2018-06-18 NOTE — Telephone Encounter (Signed)
ATC Patient.  Left message on machine to call back when available.  

## 2018-06-30 DIAGNOSIS — M069 Rheumatoid arthritis, unspecified: Secondary | ICD-10-CM | POA: Diagnosis not present

## 2018-06-30 DIAGNOSIS — E2839 Other primary ovarian failure: Secondary | ICD-10-CM | POA: Diagnosis not present

## 2018-06-30 DIAGNOSIS — E785 Hyperlipidemia, unspecified: Secondary | ICD-10-CM | POA: Diagnosis not present

## 2018-06-30 DIAGNOSIS — Z79899 Other long term (current) drug therapy: Secondary | ICD-10-CM | POA: Diagnosis not present

## 2018-06-30 DIAGNOSIS — M8580 Other specified disorders of bone density and structure, unspecified site: Secondary | ICD-10-CM | POA: Diagnosis not present

## 2018-06-30 DIAGNOSIS — K219 Gastro-esophageal reflux disease without esophagitis: Secondary | ICD-10-CM | POA: Diagnosis not present

## 2018-06-30 DIAGNOSIS — J300 Vasomotor rhinitis: Secondary | ICD-10-CM | POA: Diagnosis not present

## 2018-06-30 DIAGNOSIS — I2510 Atherosclerotic heart disease of native coronary artery without angina pectoris: Secondary | ICD-10-CM | POA: Diagnosis not present

## 2018-07-07 DIAGNOSIS — M8580 Other specified disorders of bone density and structure, unspecified site: Secondary | ICD-10-CM | POA: Diagnosis not present

## 2018-07-07 DIAGNOSIS — E2839 Other primary ovarian failure: Secondary | ICD-10-CM | POA: Diagnosis not present

## 2018-07-07 DIAGNOSIS — M85852 Other specified disorders of bone density and structure, left thigh: Secondary | ICD-10-CM | POA: Diagnosis not present

## 2018-07-09 ENCOUNTER — Other Ambulatory Visit: Payer: Self-pay

## 2018-07-09 ENCOUNTER — Ambulatory Visit (INDEPENDENT_AMBULATORY_CARE_PROVIDER_SITE_OTHER)
Admission: RE | Admit: 2018-07-09 | Discharge: 2018-07-09 | Disposition: A | Discharge status: Home / Self Care | Payer: Medicare Other | Source: Ambulatory Visit | Attending: Internal Medicine | Admitting: Internal Medicine

## 2018-07-09 DIAGNOSIS — R918 Other nonspecific abnormal finding of lung field: Secondary | ICD-10-CM | POA: Diagnosis not present

## 2018-07-09 DIAGNOSIS — J479 Bronchiectasis, uncomplicated: Secondary | ICD-10-CM | POA: Diagnosis not present

## 2018-07-13 ENCOUNTER — Encounter: Payer: Self-pay | Admitting: Internal Medicine

## 2018-07-13 ENCOUNTER — Other Ambulatory Visit: Payer: Self-pay

## 2018-07-13 ENCOUNTER — Ambulatory Visit (INDEPENDENT_AMBULATORY_CARE_PROVIDER_SITE_OTHER): Payer: Medicare Other | Admitting: Internal Medicine

## 2018-07-13 VITALS — BP 104/58 | HR 69 | Ht 64.0 in | Wt 97.4 lb

## 2018-07-13 DIAGNOSIS — A310 Pulmonary mycobacterial infection: Secondary | ICD-10-CM | POA: Diagnosis not present

## 2018-07-13 DIAGNOSIS — R05 Cough: Secondary | ICD-10-CM | POA: Diagnosis not present

## 2018-07-13 DIAGNOSIS — I201 Angina pectoris with documented spasm: Secondary | ICD-10-CM | POA: Diagnosis not present

## 2018-07-13 DIAGNOSIS — Z8739 Personal history of other diseases of the musculoskeletal system and connective tissue: Secondary | ICD-10-CM | POA: Diagnosis not present

## 2018-07-13 DIAGNOSIS — J479 Bronchiectasis, uncomplicated: Secondary | ICD-10-CM | POA: Diagnosis not present

## 2018-07-13 DIAGNOSIS — R911 Solitary pulmonary nodule: Secondary | ICD-10-CM

## 2018-07-13 DIAGNOSIS — R918 Other nonspecific abnormal finding of lung field: Secondary | ICD-10-CM

## 2018-07-13 DIAGNOSIS — R053 Chronic cough: Secondary | ICD-10-CM

## 2018-07-13 DIAGNOSIS — Z87891 Personal history of nicotine dependence: Secondary | ICD-10-CM

## 2018-07-13 DIAGNOSIS — J387 Other diseases of larynx: Secondary | ICD-10-CM

## 2018-07-13 NOTE — Patient Instructions (Addendum)
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

## 2018-07-13 NOTE — Progress Notes (Signed)
OV 06/20/2015  Chief Complaint  Patient presents with   Follow-up    Pt had induced sputum in 111/2016 - Saw Dr Ninetta LightsHatcher in 04/2015 and he stopped all 3 antibiotics -occas dry hacky cough - very little clear sputum - Denies sob, wheezing or chest discomfort    68 year old female with rheumatoid arthritis and was on methotrexate. 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 Presenting symptom was dyspnea in the background of baseline mild chronic cough. She finished 11 months of treatment. We could not get an induced sputum. I referred her to infectious diseases Dr. Ninetta LightsHatcher and based on clinical grounds he stopped MAI treatment. She is now here for follow-up. She tells me that with MAI treatment only thing that improved with the dyspnea. The baseline mild chronic cough never really changed. She still has it. She rates it as a 4 out of 10. It is associated with significant sinus drainage. She tells me that she uses Flonase for her sinus drainage Erin Black but even then she has copious sinus drainage all year around. It is perennial. At this point in time she just wants to have simple conservative measures. She does not want referral to ENT or allergy for this.    OV 02/21/2016  Chief Complaint  Patient presents with   Follow-up    Pt c/o increase in prod coug with little mucus production light yellow. Pt states she was 10 days of abx, 3 days pred, and IM injection of Kenlog. Pt states she received this becuase of her acute sinusitis.Pt states she has had rocky mtn spotted fever since being seen last.     68 year old with RA. In feb 2016 dx with  MAI with bronch following only symptom of dyspnea (Did have previous baseline mild cough that was unchanged but no sputum or fever). After this and methotrexate was stopped and she was started on MAI treatment. Dyspnea resolved with this. Based on mild cough did not change. Baseline significant sinus drainage did not change. She finished 11  months of treatment and the end of 2016 early 2017 infectious diseases stop her treatment. When I saw in February 2017 she was feeling fine other than her baseline sinus drainage and cough. She comes in for office visit today she tells me that in August 2017 she ended up with fever, chills, right upper quadrant pain, night sweats cough worsening. She denies any tick bites. However 7/10 days into the illness she was diagnosed approximately one spotted fever based on blood tests and was given 10 days of doxycycline. Around the same was also given Nexium which he broke out in a rash around her abdomen. She denies this was shingles. Subsequent to the doxycycline her symptoms resolved except that she had persistent cough and sinus drainage. This was no worse than baseline. It was also progressive after initial improvement. Subsequently approximately 9 days ago primary care physician gave her 4 days of steroids and 10 days of Omnicef. She is now on the ninth of Omnicef. Towards the last day of Omnicef. She is beginning to feel better with the sinus drainage and cough but still much worse than baseline. In addition last night she had a fever of 102. She is now worried that her MAI is back. She is not on any rheumatoid arthritis treatment and she feels is under control  Exhaled nitric oxide is slightly elevated at 33 ppb  PLAN 1. CT chest no change in MAI nodulaoirty  compated to 2015 2. There is mild emphysema on CT - basis: prior smoking 3. Rt ethmodi sinus partially opacified  Plan  - try low dose symbicort or breo or dulera (hher feno was slightly high) - for cough  - daily netti pot and nasal steroid  - +/- refer ENT  - if she wishes   - I prefer to do above simplistic plan before rushing off to trying MAI rx ag  OV 05/13/2016  Chief Complaint  Patient presents with   Follow-up    Pt c/o prod cough with green mucus, sinus congestion with white mucus, increase in SOB x weeks. Pt denies f/c/s.      A 68 year old female with history of MAI infection 2015 associated with presence of emphysema on CT chest October 2017 and recurrent sinus infection  Here for follow-up. In the last month or so she's had persistent sinus congestion associated with green sputum. The last few weeks it is worse. She is now on 5 days of Levaquin and is only partially resolved. She is having green sputum. At last visit because of the presence of emphysema in October 2017 I gave her Symbicort which did not help at a baseline state and is currently helping. Even though she is still significantly symptomatic with postnasal drainage and chest congestion. She feels it is descending into her chest. She is frustrated by constant sinus drainage and recurrent sinusitis. She has never seen ENT. CT sinus in October 2017 did show partially opacified right ethmoid sinus. sHe is also wondering about the fact if her MAI has recurred    OV 08/11/2016  Chief Complaint  Patient presents with   Follow-up    Pt states her breathing is at baseline. Pt c/o PND, sinus congestion, cough with little mucus production with clear mucus. Pt states she had a sinus infection in March and her PCP gave her 30 days of Augmentin. Pt states she saw ENT, Dr. Jearld Fenton.     68 year old female with history of MAI infection 2015 manifested as dyspnea associated with presence of emphysema on CT chest October 2017 and recurrent sinusitis  After last visit in October 2017 at which time she was having significant sinus issues I referred her to ENT. Review of the note by ENT Dr. Jearld Fenton. The concern is that she might have allergy rhin as opposed to an infection. She was then referred to primary care physician for allergy evaluation. Blood test reviewed from March 2018 and shows elevated IgE in the records and trace positive Aspergillus antibody. She is again denying any asthma symptoms. She is only bothered by symptoms and sinus congestion despite taking Singulair  and Augmentin. Her voice is changing because of the nasal twang. She'll follow-up again with Dr. Jearld Fenton if she has a flareup. In terms of her MAI symptoms October 2017 CT chest did show some infiltrates consistent with MAI as opposed ABPA. She is not having much of pulmonary symptoms of shortness of breath or cough. At this point in time she will be content with observation expectant follow-up.   OV 02/16/2017  Chief Complaint  Patient presents with   Follow-up    CT done 02/10/17.  Pt states that she has been doing good except she has had some sinus congestion due to allergies. C/o occ. cough and SOB especially when she has a flare up with her allergies and sinuses. Denies any CP. Pt states that her BP got as low as 60/40 Thursday morning, 39/67.   68 year old female  with history of MAI associated with mild bronchiectasis and recurrent sinusitis   Last visit was in April 2018 at the time because of mild emphysema on the CT scan with Spiriva. She only uses this as needed. She says it helps with the dyspnea and cough when she has a severe bout of cough associated with sinus congestion. She says that shortness of breath does not bother her as much. She wants to use his Spiriva as needed. She is asking for samples. Her main issues that she is still bothered by sinus congestion. She says that Dr. Jearld Fenton is labeled this is allergic rhinitis. But she seems very consumed by her chronic sinus congestion and postnasal drip and the cough associated with this. She says despite all these instructions from ENT Dr. Jearld Fenton she has not improved at all and it is very frustrating. She denies any vascular disease but she did admit to a history of rheumatoid arthritis diagnosed sometime around 2008-2010. Is unclear what treatment she is on. Off note she is scheduled for cataract surgery and she wants to know if it is okay to do this from a cough standpoint  IMPRESSION: 1. No acute cardiopulmonary abnormalities. 2. 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 3. Aortic Atherosclerosis (ICD10-I70.0) and Emphysema (ICD10-J43.9). Coronary artery calcifications noted in the LAD.   Electronically Signed   By: Signa Kell M.D.   On: 02/10/2017 13:57   OV 06/16/2017  Chief Complaint  Patient presents with   Follow-up    Pt states she has seen multiple doctors due to issues with allergies. Pt does have an occ. cough from postnasal drainage and has SOB when coughing a lot. Pt stated Dr. Suszanne Conners said symptoms are from acid reflux.   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  Previous MAI infection with treatment and CT scan October 2018 with improvement.  Has chronic sinusitis  Since my last visit with her several months ago she says she has seen allergist Dr. Eros Callas.  Had extensive allergy testing was negative but nevertheless placed on Singulair.  Subsequently sent to second ENT opinion by Dr. Jodean Lima.  ENT exam apparently showed suggestive signs of acid reflux placed on Prilosec therapy.  Despite this chronic nasal congestion continues.  She is blowing out a lot of mucus in the morning.  In addition she is clearing the throat a lot.  She also has a cough.  She says the symptoms do not wake her up in the middle of the night at all.  Symptoms are present only in the daytime.  There is no chest pain.  Her voice feels hoarse and she has a ticklish sensation.    OV 01/12/2018  Subjective:  Patient ID: Erin Black, female , DOB: [birth year] , age 68 y.o. , MRN: [id number] , ADDRESS: [address] [place] [address]   01/12/2018 -   Chief Complaint  Patient presents with   Follow-up    Pt states she has been doing good since last visit. States she is still coughing some.     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  Previous MAI infection with treatment and CT scan October 2018 with improvement.  Has chronic sinusitis  and cough neuroapathy   HPI Erin Black 68 y.o. 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 Since last seeing me she went and saw ENT. She was advised voice rehabilitation and gabapentin but she has deferred this. She [facility]. She continues to have mild sinus drainage andclearing of the throat all unchanged.  no wheezing no shortness of breath. No chest tightness. According to her history she recently saw primary care physician and the physical exam and blood work were normal otherwise. She will have a high dose flu shot today. She smoked heavily in the past quitting in the year 2000. She smoked 1.5 packs per day 30 of 40 years. A sister was diagnosed with lung cancer age of 69.     OV 07/13/2018  Subjective:  Patient ID: Erin MoriWallace Parekh, female , DOB: 10/30/1950 , age 68 y.o. 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  Previous MAI infection with treatment and CT scan October 2018 with improvement.  Has chronic sinusitis and cough neuroapathy     HPI Erin MoriWallace Obie 68 y.o. -presents for follow-up.  Cough is fine.  Her husband had suspected influenza last week and he has recovered from it.  Patient herself is asymptomatic.  She had a lot of questions about coronavirus 19 and social distancing.  Otherwise she is well.  She had a CT scan of the chest that shows new onset of 8 mm right lower lobe nodule compared to October 2018.  She has a history of rheumatoid arthritis, MAI and previous smoking but also her sister died from lung cancer.  I personally visualized the nodule and does not look spiculated.   IMPRESSION: 1. Largely stable exam since 02/10/2017 although 5 mm right middle lobe and 8 mm right lower lobe pulmonary nodules are new in the interval. Non-contrast chest  CT at 3-6 months is recommended. If the nodules are stable at time of repeat CT, then future CT at 18-24 months (from today's scan) is considered optional for low-risk patients, but is recommended for high-risk patients. This recommendation follows the consensus statement: Guidelines for Management of Incidental Pulmonary Nodules Detected on CT Images: From the Fleischner Society 2017; Radiology 2017; 284:228-243. 2.  Emphysema. (ICD10-J43.9) 3.  Aortic Atherosclerois (ICD10-170.0)   Electronically Signed   By: Kennith CenterEric  Mansell M.D.   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   reports that she quit smoking about 20 years ago. Her smoking use included cigarettes. She has a 25.00 pack-year smoking history. She has never used smokeless tobacco.  Past Surgical History:  Procedure Laterality Date   bilateral tubal ligation     CARDIAC CATHETERIZATION     LUMBAR SPINE SURGERY     strabismus  repair     TONSILLECTOMY AND ADENOIDECTOMY     VIDEO BRONCHOSCOPY Bilateral 05/17/2014  Procedure: VIDEO BRONCHOSCOPY WITHOUT FLUORO;  Surgeon: Storm Frisk, MD;  Location: [facility];  Service: Cardiopulmonary;  Laterality: Bilateral;    Allergies  Allergen Reactions   Yellow Jacket Venom [Bee Venom] Anaphylaxis    Hives/swelling   Omnicef [Cefdinir]     rash   Plavix [Clopidogrel Bisulfate]     rash    Immunization History  Administered Date(s) Administered   Influenza, High Dose Seasonal PF 01/22/2016, 01/12/2018   Influenza-Unspecified 01/26/2014, 01/16/2015, 02/09/2017   Pneumococcal Conjugate-13 02/19/2015    Family History  Problem Relation Age of Onset   Emphysema Mother    Heart disease Mother    Lung cancer Mother    Prostate cancer Father    Lung cancer Sister      Current Outpatient Medications:    aspirin 81 MG tablet, Take 81 mg by mouth daily., Disp: , Rfl:    atorvastatin (LIPITOR) 40 MG tablet, Take 40 mg by mouth daily., Disp: , Rfl:    atorvastatin (LIPITOR) 80 MG tablet, TAKE 1/2 TABLET BY MOUTH EVERY DAY, Disp: 45 tablet, Rfl: 3   cetirizine (ZYRTEC) 10 MG tablet, Take 10 mg by mouth daily., Disp: , Rfl: 4   clonazePAM (KLONOPIN) 0.5 MG tablet, Take 0.5 mg by mouth daily. , Disp: , Rfl:    EPINEPHrine (EPIPEN 2-PAK) 0.3 mg/0.3 mL IJ SOAJ injection, Inject 0.3 mg into the muscle as needed. , Disp: , Rfl:    ipratropium (ATROVENT) 0.06 % nasal spray, instill 2 sprays into each nostril twice daily as needed for drainage, Disp: , Rfl: 5   metoprolol tartrate (LOPRESSOR) 25 MG tablet, Take 1 tablet (25 mg total) by mouth at bedtime. (BETA BLOCKER), Disp: 90 tablet, Rfl: 3   nitroGLYCERIN (NITROSTAT) 0.4 MG SL tablet, Place 0.4 mg under the tongue every 5 (five) minutes as needed for chest pain., Disp: , Rfl:    omeprazole (PRILOSEC) 20 MG capsule, Take 20 mg by mouth daily. , Disp: , Rfl:    tiZANidine (ZANAFLEX) 4 MG tablet,  Take 4 mg by mouth at bedtime. , Disp: , Rfl:       Objective:   Vitals:   07/13/18 0954  BP: (!) 104/58  Pulse: 69  SpO2: 100%  Weight: 97 lb 6.4 oz (44.2 kg)  Height:  (1.626 m)    Estimated body mass index is 16.72 kg/m as calculated from the following:   Height as of this encounter:  (1.626 m).   Weight as of this encounter: 97 lb 6.4 oz (44.2 kg).  @  [employer]   07/13/18 0954  Weight: 97 lb 6.4 oz (44.2 kg)     Physical Exam  General Appearance:    Alert, cooperative, no distress, appears stated age - yes , Deconditioned looking - no , OBESE  - no, Sitting on Wheelchair -  no  Head:    Normocephalic, without obvious abnormality, atraumatic  Eyes:    PERRL, conjunctiva/corneas clear,  Ears:    Normal TM's and external ear canals, both ears  Nose:   Nares normal, septum midline, mucosa normal, no drainage    or sinus tenderness. OXYGEN ON  - no . Patient is @ ra   Throat:   Lips, mucosa, and tongue normal; teeth and gums normal. Cyanosis on lips - no  Neck:   Supple, symmetrical, trachea midline, no adenopathy;    thyroid:  no enlargement/tenderness/nodules; no carotid   bruit or JVD  Back:     Symmetric,  no curvature, ROM normal, no CVA tenderness  Lungs:     Distress - no , Wheeze no, Barrell Chest - no, Purse lip breathing - no, Crackles - no   Chest Wall:    No tenderness or deformity.    Heart:    Regular rate and rhythm, S1 and S2 normal, no rub   or gallop, Murmur - no  Breast Exam:    NOT DONE  Abdomen:     Soft, non-tender, bowel sounds active all four quadrants,    no masses, no organomegaly. Visceral obesity - no  Genitalia:   NOT DONE  Rectal:   NOT DONE  Extremities:   Extremities - normal, Has Cane - no, Clubbing - no, Edema - no  Pulses:   2+ and symmetric all extremities  Skin:   Stigmata of Connective Tissue Disease - no  Lymph nodes:   Cervical, supraclavicular, and axillary nodes normal  Psychiatric:  Neurologic:    Pleasant - yes, Anxious - no, Flat affect - no  CAm-ICU - neg, Alert and Oriented x 3 - yes, Moves all 4s - yes, Speech - normal, Cognition - intact           Assessment:       ICD-10-CM   1. Bronchiectasis without complication (HCC) J47.9   2. Chronic cough R05   3. Irritable larynx J38.7   4. Multiple lung nodules on CT R91.8   5. History of rheumatoid arthritis Z87.39   6. 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 ; Pos FOB cultures A31.0   7. History of smoking Z87.891        Plan:     Patient Instructions  Bronchiectasis without complication (HCC) Chronic cough Irritable larynx  - glad better/stable   Multiple lung nodules on CT History of rheumatoid arthritis 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 ; Pos FOB cultures History of smoking  -  Lung nodules can be from any of the reasons like rheumatoid, non specific inflammation, cancer though risk low  Plan  - do NODIFY blood test  - CT chest without contrast in 6 months but will adjus this based on blood test results  Followup 6 months or sooner if needed        SIGNATURE    Dr. Kalman Shan, M.D., F.C.C.P,  Pulmonary and Critical Care Medicine Staff Physician, [facility] Center Director - Interstitial Lung Disease  Program  Pulmonary Fibrosis [facility] at [facility] [place], [place], [address]  Pager: [phone], If no answer or between  15:00h - 7:00h: call [phone] Telephone: [phone]  10:39 AM 07/13/2018

## 2018-07-20 ENCOUNTER — Telehealth: Payer: Self-pay | Admitting: Internal Medicine

## 2018-07-20 DIAGNOSIS — R911 Solitary pulmonary nodule: Secondary | ICD-10-CM

## 2018-07-20 NOTE — Telephone Encounter (Signed)
Left message for Nisa to call back.   IMPRESSION: 1. Largely stable exam since 02/10/2017 although 5 mm right middle lobe and 8 mm right lower lobe pulmonary nodules are new in the interval. Non-contrast chest CT at 3-6 months is recommended. If the nodules are stable at time of repeat CT, then future CT at 18-24 months (from today's scan) is considered optional for low-risk patients, but is recommended for high-risk patients. This recommendation follows the consensus statement: Guidelines for Management of Incidental Pulmonary Nodules Detected on CT Images: From the Fleischner Society 2017; Radiology 2017; 284:228-243. 2.  Emphysema. (ICD10-J43.9) 3.  Aortic Atherosclerois (ICD10-170.0)

## 2018-07-20 NOTE — Telephone Encounter (Signed)
requeistion form signed and left in my office on side desk. Please ask company when result will be readyu . Then, we can give results

## 2018-07-20 NOTE — Telephone Encounter (Signed)
Left message for patient to call back  

## 2018-07-20 NOTE — Telephone Encounter (Signed)
Call returned to patient, she state she had a nodify testing done and she is looking for results.   MR please advise.

## 2018-07-21 DIAGNOSIS — R918 Other nonspecific abnormal finding of lung field: Secondary | ICD-10-CM | POA: Diagnosis not present

## 2018-07-21 NOTE — Telephone Encounter (Signed)
I located the form and faxed it to [phone] Called [employer] at [phone] and spoke with Tanner He received our fax and states the results should be available 07/22/2018- they will be faxed  Will await results

## 2018-07-22 NOTE — Telephone Encounter (Signed)
Pt is calling back about her lab results [phone]

## 2018-07-22 NOTE — Telephone Encounter (Signed)
Called and spoke with pt stating to her that we had to fix a couple things on the [employer] form and stated to her that is has been sent back to the company. Stated to her as soon as we had the results, we would call her and let her know what they are. Pt expressed understanding. Nothing further needed.

## 2018-07-22 NOTE — Telephone Encounter (Signed)
LMTCB x2 for Erin Black.

## 2018-07-23 NOTE — Telephone Encounter (Signed)
Called and spoke with Nisa from [employer] and she stated that she did receive the fax back of the nodule diameter and had sent the results to our office. The results have been received and have been placed in MR's office for him to review.   Routing to MR.

## 2018-07-23 NOTE — Telephone Encounter (Signed)
LVM for patient to return call back. X3 

## 2018-07-23 NOTE — Telephone Encounter (Signed)
bllod work - likely benign nodule. So can have CT chest repeat without contrast in 6-9 months

## 2018-07-23 NOTE — Telephone Encounter (Signed)
Called and spoke with pt letting her know the results of the [employer] labwork. Pt expressed understanding. Stated we would have her get a ct prior to OV. Nothing further needed.

## 2018-09-13 IMAGING — CT CT PARANASAL SINUSES LIMITED
1 series · 13 of 15 positions shown, 17 images · non-contrast
Comparison: None.

CLINICAL DATA: Chronic cough, postnasal drip

EXAM:
CT PARANASAL SINUS LIMITED WITHOUT CONTRAST
TECHNIQUE: Non-contiguous multidetector CT images of the paranasal sinuses were
obtained in a single plane without contrast.

[Series 5: limited sinus st · axial · 0.20mm/px · z∈[-78,+42]mm · 13 of 15 slices shown, 17 images]
[im 2/15  brain]
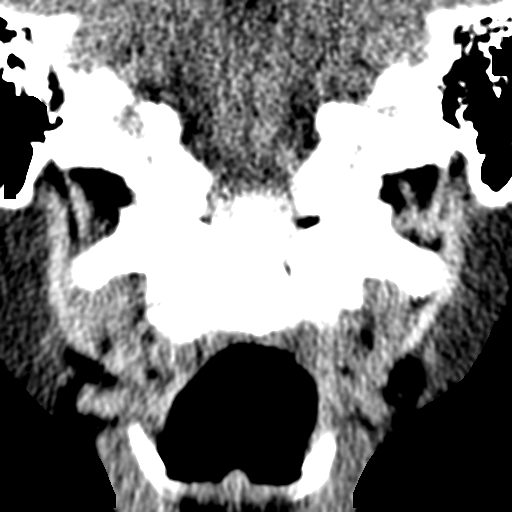
[im 2/15  bone]
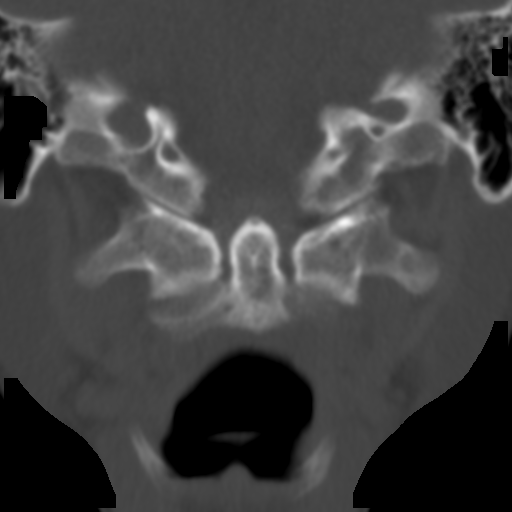
[im 3/15  bone]
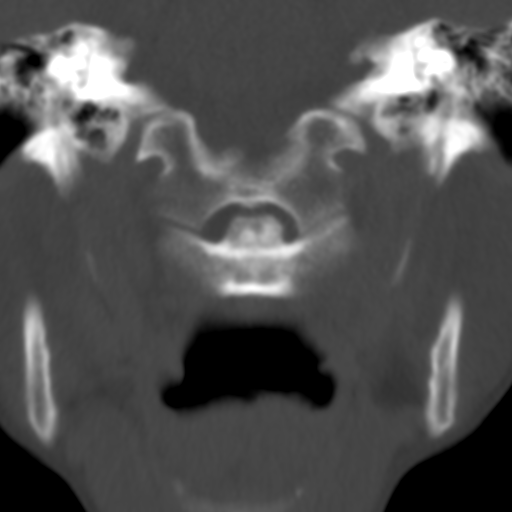
[im 4/15  bone]
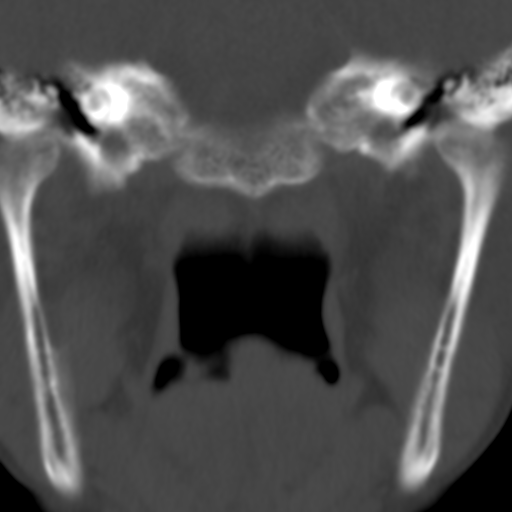
[im 5/15  bone]
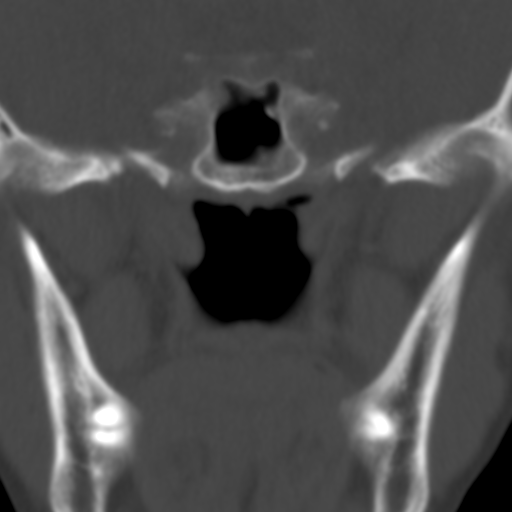
[im 6/15  brain]
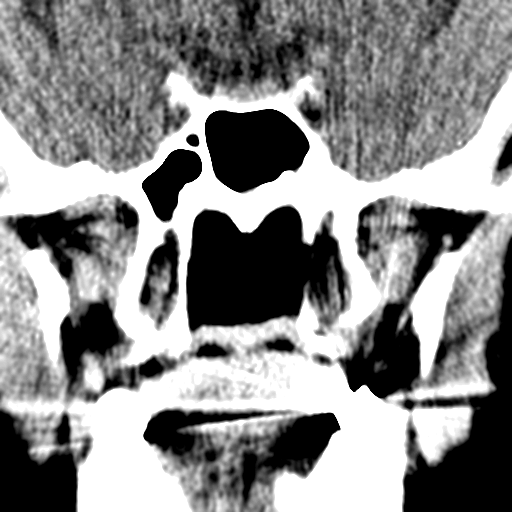
[im 6/15  bone]
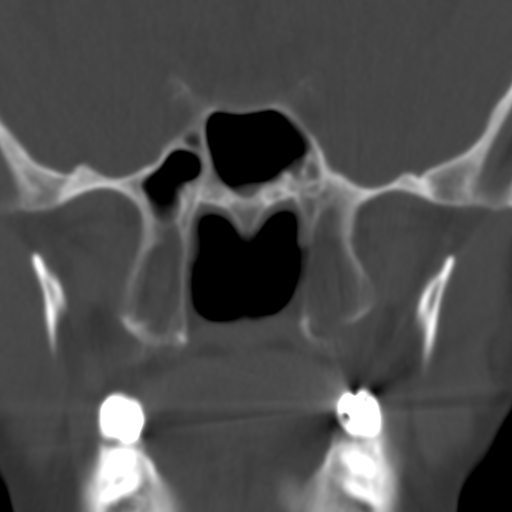
[im 7/15  bone]
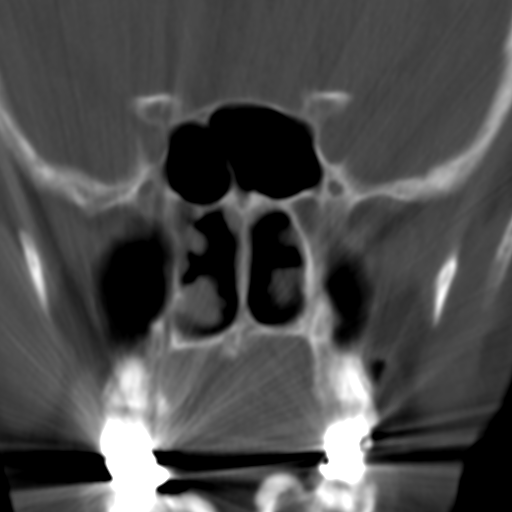
[im 8/15  bone]
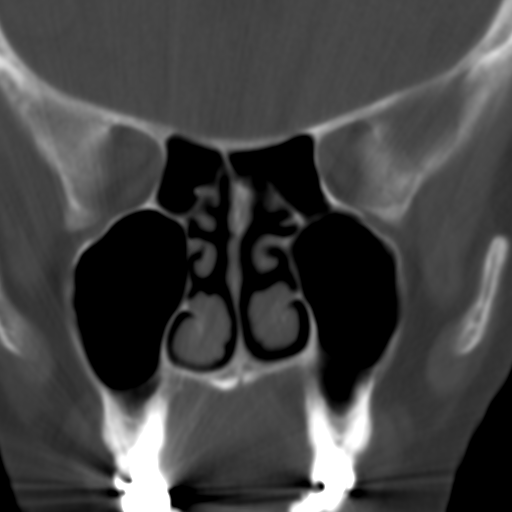
[im 9/15  bone]
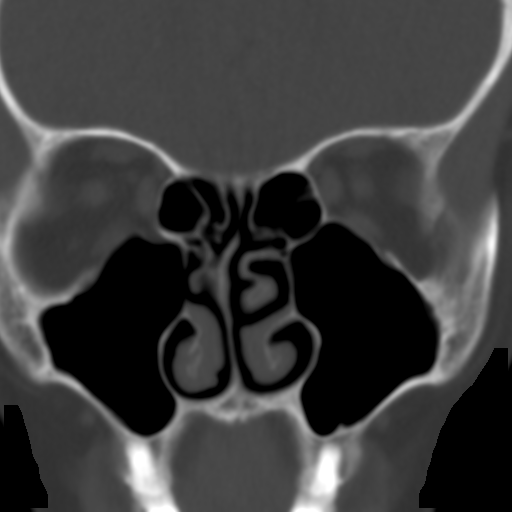
[im 10/15  brain]
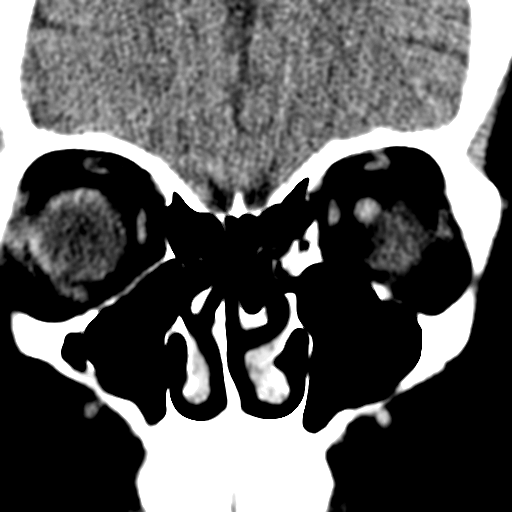
[im 10/15  bone]
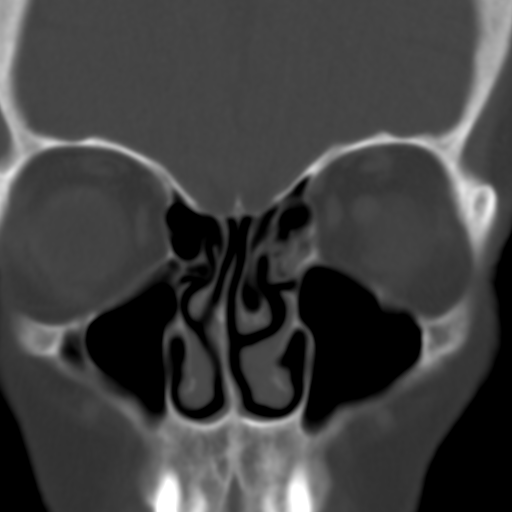
[im 11/15  bone]
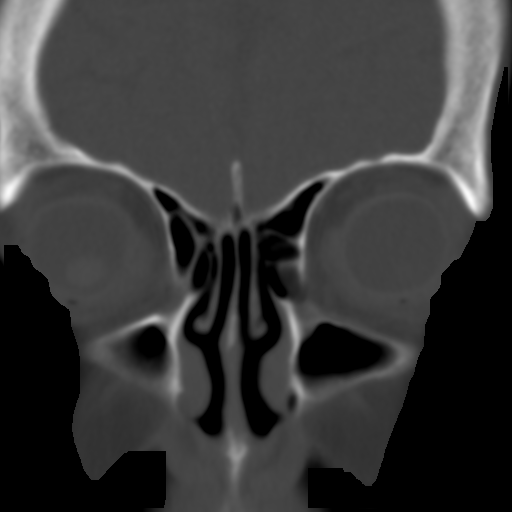
[im 12/15  bone]
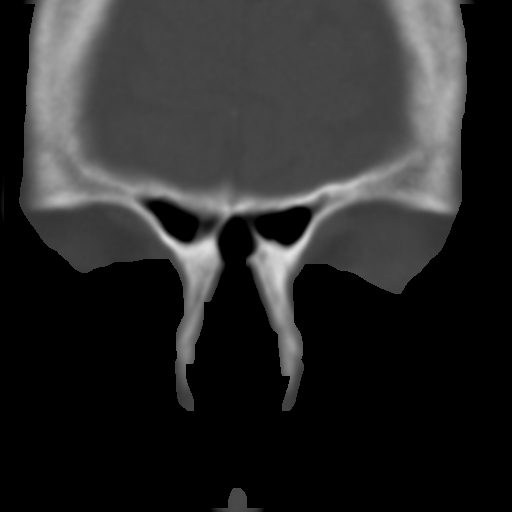
[im 13/15  bone]
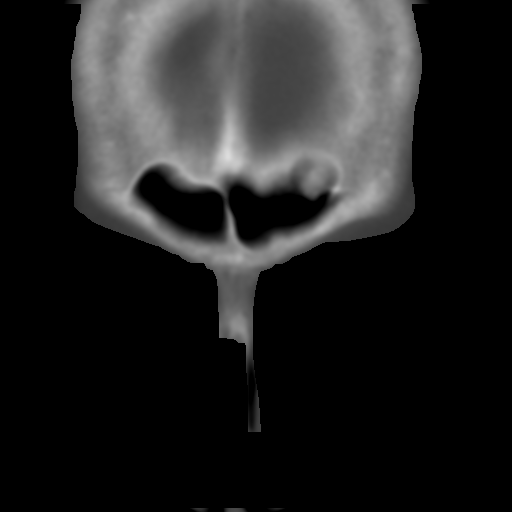
[im 14/15  brain]
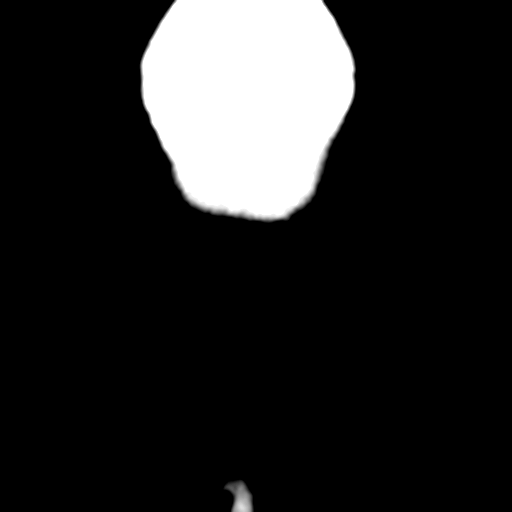
[im 14/15  bone]
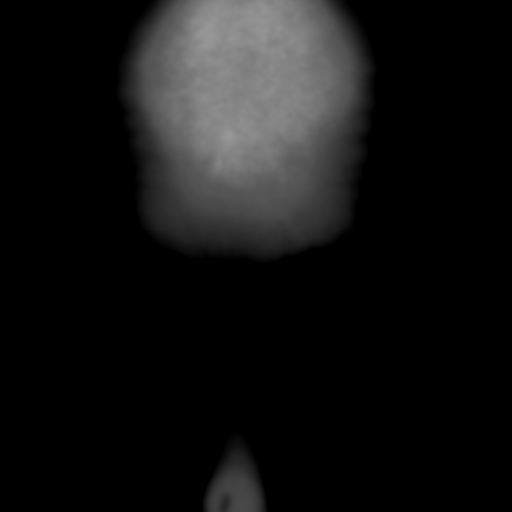

[13 of 15 positions shown; findings below may reference images not displayed]

FINDINGS: Bilateral maxillary sinus is well aerated patent. Bilateral sphenoid
sinus well aerated. Mild mucosal thickening with partial
opacification right ethmoid air cells. Frontal sinuses are
unremarkable. No paranasal sinuses air-fluid levels. Mild left nasal
septum deviation. Bilateral semilunar canal is patent. Metallic
dental artifact are noted. The nasal turbinates are unremarkable.
IMPRESSION: No paranasal sinuses air-fluid level. Mild mucosal thickening with
partial opacification right ethmoid air cells. Mild left nasal
septum deviation.

## 2018-11-02 ENCOUNTER — Other Ambulatory Visit: Payer: Self-pay

## 2018-11-02 ENCOUNTER — Telehealth: Payer: Self-pay | Admitting: Cardiology

## 2018-11-02 MED ORDER — METOPROLOL TARTRATE 25 MG PO TABS: 25.0000 mg | ORAL_TABLET | Freq: Every day | ORAL | 3 refills | Status: DC

## 2018-11-02 NOTE — Telephone Encounter (Signed)
°*  STAT* If patient is at the pharmacy, call can be transferred to refill team. ° ° °1. Which medications need to be refilled? (please list name of each medication and dose if known) metoprolol tartrate (LOPRESSOR) 25 MG tablet ° ° °2. Which pharmacy/location (including street and city if local pharmacy) is medication to be sent to?  °[facility] - [place], [place] - [address] [phone] (Phone) °[phone] (Fax)  ° ° °3. Do they need a 30 day or 90 day supply? 90 day °

## 2018-12-20 ENCOUNTER — Other Ambulatory Visit: Payer: Self-pay

## 2018-12-20 ENCOUNTER — Ambulatory Visit (INDEPENDENT_AMBULATORY_CARE_PROVIDER_SITE_OTHER)
Admission: RE | Admit: 2018-12-20 | Discharge: 2018-12-20 | Disposition: A | Discharge status: Home / Self Care | Payer: Medicare Other | Source: Ambulatory Visit | Attending: Internal Medicine | Admitting: Internal Medicine

## 2018-12-20 ENCOUNTER — Encounter (INDEPENDENT_AMBULATORY_CARE_PROVIDER_SITE_OTHER): Payer: Self-pay

## 2018-12-20 DIAGNOSIS — R911 Solitary pulmonary nodule: Secondary | ICD-10-CM

## 2018-12-28 ENCOUNTER — Other Ambulatory Visit: Payer: Self-pay | Admitting: General Surgery

## 2018-12-28 ENCOUNTER — Encounter: Payer: Self-pay | Admitting: Internal Medicine

## 2018-12-28 ENCOUNTER — Ambulatory Visit (INDEPENDENT_AMBULATORY_CARE_PROVIDER_SITE_OTHER): Payer: Medicare Other | Admitting: Internal Medicine

## 2018-12-28 ENCOUNTER — Other Ambulatory Visit: Payer: Self-pay

## 2018-12-28 VITALS — BP 118/70 | HR 77 | Temp 98.2°F | Ht 64.0 in | Wt 96.2 lb

## 2018-12-28 DIAGNOSIS — R918 Other nonspecific abnormal finding of lung field: Secondary | ICD-10-CM

## 2018-12-28 DIAGNOSIS — Z23 Encounter for immunization: Secondary | ICD-10-CM | POA: Diagnosis not present

## 2018-12-28 DIAGNOSIS — R053 Chronic cough: Secondary | ICD-10-CM

## 2018-12-28 DIAGNOSIS — I201 Angina pectoris with documented spasm: Secondary | ICD-10-CM | POA: Diagnosis not present

## 2018-12-28 DIAGNOSIS — J387 Other diseases of larynx: Secondary | ICD-10-CM

## 2018-12-28 DIAGNOSIS — R05 Cough: Secondary | ICD-10-CM

## 2018-12-28 NOTE — Patient Instructions (Addendum)
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 These sare improved as of Aug 2020 compared to march 2020 - high dose flu shot 12/28/2018    Plan  - CT chest without contrast in 12 months  Followup 6 months or sooner if needed

## 2018-12-28 NOTE — Progress Notes (Signed)
OV 06/20/2015  Chief Complaint  Patient presents with  . Follow-up    Pt had induced sputum in 111/2016 - Saw Dr Erin Black in 04/2015 and he stopped all 3 antibiotics -occas dry hacky cough - very little clear sputum - Denies sob, wheezing or chest discomfort    68 year old female with rheumatoid arthritis and was on methotrexate. 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 Presenting symptom was dyspnea in the background of baseline mild chronic cough. She finished 11 months of treatment. We could not get an induced sputum. I referred her to infectious diseases Dr. Johnnye Black and based on clinical grounds he stopped MAI treatment. She is now here for follow-up. She tells me that with MAI treatment only thing that improved with the dyspnea. The baseline mild chronic cough never really changed. She still has it. She rates it as a 4 out of 10. It is associated with significant sinus drainage. She tells me that she uses Flonase for her sinus drainage [place] but even then she has copious sinus drainage all year around. It is perennial. At this point in time she just wants to have simple conservative measures. She does not want referral to ENT or allergy for this.    OV 02/21/2016  Chief Complaint  Patient presents with  . Follow-up    Pt c/o increase in prod coug with little mucus production light yellow. Pt states she was 10 days of abx, 3 days pred, and IM injection of [place]. Pt states she received this becuase of her acute sinusitis.Pt states she has had rocky mtn spotted fever since being seen last.     68 year old with RA. In feb 2016 dx with  MAI with bronch following only symptom of dyspnea (Did have previous baseline mild cough that was unchanged but no sputum or fever). After this and methotrexate was stopped and she was started on MAI treatment. Dyspnea resolved with this. Based on mild cough did not change. Baseline significant sinus drainage did not change. She finished 11  months of treatment and the end of 2016 early 2017 infectious diseases stop her treatment. When I saw in February 2017 she was feeling fine other than her baseline sinus drainage and cough. She comes in for office visit today she tells me that in August 2017 she ended up with fever, chills, right upper quadrant pain, night sweats cough worsening. She denies any tick bites. However 7/10 days into the illness she was diagnosed approximately one spotted fever based on blood tests and was given 10 days of doxycycline. Around the same was also given Nexium which he broke out in a rash around her abdomen. She denies this was shingles. Subsequent to the doxycycline her symptoms resolved except that she had persistent cough and sinus drainage. This was no worse than baseline. It was also progressive after initial improvement. Subsequently approximately 9 days ago primary care physician gave her 4 days of steroids and 10 days of Omnicef. She is now on the ninth of [place]. Towards the last day of Omnicef. She is beginning to feel better with the sinus drainage and cough but still much worse than baseline. In addition last night she had a fever of 102. She is now worried that her MAI is back. She is not on any rheumatoid arthritis treatment and she feels is under control  Exhaled nitric oxide is slightly elevated at 33 ppb  PLAN 1. CT chest no change in MAI nodulaoirty compated  to 2015 2. There is mild emphysema on CT - basis: prior smoking 3. Rt ethmodi sinus partially opacified  Plan  - try low dose symbicort or breo or dulera (hher feno was slightly high) - for cough  - daily netti pot and nasal steroid  - +/- refer ENT  - if she wishes   - I prefer to do above simplistic plan before rushing off to trying MAI rx ag  OV 05/13/2016  Chief Complaint  Patient presents with  . Follow-up    Pt c/o prod cough with green mucus, sinus congestion with white mucus, increase in SOB x weeks. Pt denies f/c/s.      A 68 year old female with history of MAI infection 2015 associated with presence of emphysema on CT chest October 2017 and recurrent sinus infection  Here for follow-up. In the last month or so she's had persistent sinus congestion associated with green sputum. The last few weeks it is worse. She is now on 5 days of Levaquin and is only partially resolved. She is having green sputum. At last visit because of the presence of emphysema in October 2017 I gave her Symbicort which did not help at a baseline state and is currently helping. Even though she is still significantly symptomatic with postnasal drainage and chest congestion. She feels it is descending into her chest. She is frustrated by constant sinus drainage and recurrent sinusitis. She has never seen ENT. CT sinus in October 2017 did show partially opacified right ethmoid sinus. sHe is also wondering about the fact if her MAI has recurred    OV 08/11/2016  Chief Complaint  Patient presents with  . Follow-up    Pt states her breathing is at baseline. Pt c/o PND, sinus congestion, cough with little mucus production with clear mucus. Pt states she had a sinus infection in March and her PCP gave her 30 days of Augmentin. Pt states she saw ENT, Dr. Jearld Black.     68 year old female with history of MAI infection 2015 manifested as dyspnea associated with presence of emphysema on CT chest October 2017 and recurrent sinusitis  After last visit in October 2017 at which time she was having significant sinus issues I referred her to ENT. Review of the note by ENT Dr. Jearld Black. The concern is that she might have allergy rhin as opposed to an infection. She was then referred to primary care physician for allergy evaluation. Blood test reviewed from March 2018 and shows elevated IgE in the records and trace positive Aspergillus antibody. She is again denying any asthma symptoms. She is only bothered by symptoms and sinus congestion despite taking Singulair and  Augmentin. Her voice is changing because of the nasal twang. She'll follow-up again with Dr. Jearld Black if she has a flareup. In terms of her MAI symptoms October 2017 CT chest did show some infiltrates consistent with MAI as opposed ABPA. She is not having much of pulmonary symptoms of shortness of breath or cough. At this point in time she will be content with observation expectant follow-up.   OV 02/16/2017  Chief Complaint  Patient presents with  . Follow-up    CT done 02/10/17.  Pt states that she has been doing good except she has had some sinus congestion due to allergies. C/o occ. cough and SOB especially when she has a flare up with her allergies and sinuses. Denies any CP. Pt states that her BP got as low as 60/40 Thursday morning, 1410/7718.   [age] year old female with  history of MAI associated with mild bronchiectasis and recurrent sinusitis   Last visit was in April 2018 at the time because of mild emphysema on the CT scan with Spiriva. She only uses this as needed. She says it helps with the dyspnea and cough when she has a severe bout of cough associated with sinus congestion. She says that shortness of breath does not bother her as much. She wants to use his Spiriva as needed. She is asking for samples. Her main issues that she is still bothered by sinus congestion. She says that Dr. Janace Hoard is labeled this is allergic rhinitis. But she seems very consumed by her chronic sinus congestion and postnasal drip and the cough associated with this. She says despite all these instructions from ENT Dr. Janace Hoard she has not improved at all and it is very frustrating. She denies any vascular disease but she did admit to a history of rheumatoid arthritis diagnosed sometime around 2008-2010. Is unclear what treatment she is on. Off note she is scheduled for cataract surgery and she wants to know if it is okay to do this from a cough standpoint  IMPRESSION: 1. No acute cardiopulmonary abnormalities. 2. 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 3. Aortic Atherosclerosis (ICD10-I70.0) and Emphysema (ICD10-J43.9). Coronary artery calcifications noted in the LAD.   Electronically Signed   By: Kerby Moors M.D.   On: 02/10/2017 13:57   OV 06/16/2017  Chief Complaint  Patient presents with  . Follow-up    Pt states she has seen multiple doctors due to issues with allergies. Pt does have an occ. cough from postnasal drainage and has SOB when coughing a lot. Pt stated Dr. Benjamine Mola said symptoms are from acid reflux.   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  Previous MAI infection with treatment and CT scan October 2018 with improvement.  Has chronic sinusitis  Since my last visit with her several months ago she says she has seen allergist Dr. Donneta Romberg.  Had extensive allergy testing was negative but nevertheless placed on Singulair.  Subsequently sent to second ENT opinion by Dr. Lorelee Cover.  ENT exam apparently showed suggestive signs of acid reflux placed on Prilosec therapy.  Despite this chronic nasal congestion continues.  She is blowing out a lot of mucus in the morning.  In addition she is clearing the throat a lot.  She also has a cough.  She says the symptoms do not wake her up in the middle of the night at all.  Symptoms are present only in the daytime.  There is no chest pain.  Her voice feels hoarse and she has a ticklish sensation.    OV 01/12/2018  Subjective:  Patient ID: Erin Black, female , DOB: 1951-01-29 , age 67 y.o. , MRN: [id number] , ADDRESS: [address] [place] [address]   01/12/2018 -   Chief Complaint  Patient presents with  . Follow-up    Pt states she has been doing good since last visit. States she is still coughing some.     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  Previous MAI infection with treatment and CT scan October 2018 with improvement.  Has chronic sinusitis  and cough neuroapathy   HPI Alyda Megna 68 y.o. 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 Since last seeing me she went and saw ENT. She was advised voice rehabilitation and gabapentin but she has deferred this. She plans to see a  Rhinologist at  [facility] Dr Pearlie Oyster. She continues to have mild sinus drainage andclearing of the throat all unchanged.  no wheezing no shortness of breath. No chest tightness. According to her history she recently saw primary care physician and the physical exam and blood work were normal otherwise. She will have a high dose flu shot today. She smoked heavily in the past quitting in the year 2000. She smoked 1.5 packs per day 30 of 40 years. A sister was diagnosed with lung cancer age of 75.     OV 07/13/2018  Subjective:  Patient ID: Erin Black, female , DOB: 1951-04-13 , age 70 y.o. , MRN: [id number] , ADDRESS: [address] [place] [address]   07/13/2018 -   Chief Complaint  Patient presents with  . 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  Previous MAI infection with treatment and CT scan October 2018 with improvement.  Has chronic sinusitis and cough neuroapathy     HPI Anselma Herbel 68 y.o. -presents for follow-up.  Cough is fine.  Her husband had suspected influenza last week and he has recovered from it.  Patient herself is asymptomatic.  She had a lot of questions about coronavirus 19 and social distancing.  Otherwise she is well.  She had a CT scan of the chest that shows new onset of 8 mm right lower lobe nodule compared to October 2018.  She has a history of rheumatoid arthritis, MAI and previous smoking but also her sister died from lung cancer.  I personally visualized the nodule and does not look spiculated.   IMPRESSION: 1. Largely stable exam since 02/10/2017 although 5 mm right middle lobe and 8 mm right lower lobe pulmonary nodules are new in the interval. Non-contrast chest  CT at 3-6 months is recommended. If the nodules are stable at time of repeat CT, then future CT at 18-24 months (from today's scan) is considered optional for low-risk patients, but is recommended for high-risk patients. This recommendation follows the consensus statement: Guidelines for Management of Incidental Pulmonary Nodules Detected on CT Images: From the Fleischner Society 2017; Radiology 2017; 284:228-243. 2.  Emphysema. (ICD10-J43.9) 3.  Aortic Atherosclerois (ICD10-170.0)   Electronically Signed   By: Misty Stanley M.D.   On: 07/09/2018 13:34  OV 12/28/2018  Subjective:  Patient ID: Erin Black, female , DOB: January 18, 1951 , age 22 y.o. , MRN: [id number] , ADDRESS: [address] [place] [address]   12/28/2018 -   Chief Complaint  Patient presents with  . Bronchiectasis without complication    Feels breathing is worse since last visit. Has some shortness of breath and wheeizing on occasion.   Known RA patient (prior mtx). Former smoker Previous MAI infection with treatment and CT scan October 2018 with improvement.  Lung nodules - improved march 2020 -> aug 2020 Has chronic sinusitis and cough neuroapathy    HPI Jakki Doughty 68 y.o. -returns for follow-up.  Last visit was in the spring 2020.  At this point in time she has lost 3 pounds of weight but she says this is due to active exercise.  She continues to be bothered by her chronic cough with clearing of the throat.  She had an appointment at [facility] but because of the  [place] did not follow-up.  She is requesting a referral.  She also feels that her cough is causing shortness of breath.  Particularly early in the morning.  For the first 1 hour after she wakes up when she moves around she feels dyspneic.  She is constantly clearing her throat.  She feels a constant presence of postnasal drip.  She also has lung nodules.  We were concerned this might be cancer.  She did a follow-up CT scan of the  chest in August 2020.  I reviewed the report.  1 of the nodules is better if the other one is stable.  Her neck CT scan is now due in 1 year.  On the CT scan there is no evidence of bronchiectasis reported or COPD or interstitial lung disease.  During her regular walks she does not feel much dyspnea.  She is keen to have the high-dose flu shot today.    CT chest aug 2020 CLINICAL DATA:  F/u solitary pulmonary nodule No hx of sx No hx of CA Former smokerLung nodule, >=1cm  EXAM: CT CHEST WITHOUT CONTRAST  TECHNIQUE: Multidetector CT imaging of the chest was performed following the standard protocol without IV contrast.  COMPARISON:  CT July 09, 2018  FINDINGS: Cardiovascular: Coronary artery calcification and aortic atherosclerotic calcification.  Mediastinum/Nodes: No axillary or supraclavicular adenopathy. No mediastinal hilar adenopathy. No pericardial effusion. Esophagus normal.  Lungs/Pleura: In the RIGHT lower lobe nodule of concern measures 3 mm x 3 mm compared to 8 mm x 8 mm for adduction size (image 95/3) nodule in the RIGHT middle lobe measures 4 mm (image 88/3 unchanged from 5 mm. No new nodularity.  Upper Abdomen: Limited view of the liver, kidneys, pancreas are unremarkable. Normal adrenal glands.  Musculoskeletal: No aggressive osseous lesion.  IMPRESSION: 1. Decrease in size of the larger RIGHT lower lobe pulmonary nodule suggests benign etiology. 2. Stable RIGHT middle lobe pulmonary nodule at 4-5 mm. Consider follow-up CT scan in 12 month per Fleischner criteria.   Electronically Signed   By: Genevive Bi M.D.   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   reports that she quit smoking about 20 years ago. Her smoking use included cigarettes. She has a 25.00 pack-year smoking history. She has never used smokeless tobacco.  Past Surgical History:  Procedure Laterality Date  . bilateral tubal ligation    . CARDIAC CATHETERIZATION    . LUMBAR SPINE SURGERY    . strabismus repair    . TONSILLECTOMY AND ADENOIDECTOMY    . VIDEO BRONCHOSCOPY Bilateral 05/17/2014   Procedure: VIDEO BRONCHOSCOPY WITHOUT FLUORO;  Surgeon: Storm Frisk, MD;  Location: [facility];  Service: Cardiopulmonary;  Laterality: Bilateral;    Allergies  Allergen Reactions  . Yellow Jacket Venom [Bee Venom] Anaphylaxis    Hives/swelling  . Omnicef [Cefdinir]     rash  . Plavix [Clopidogrel Bisulfate]     rash    Immunization History  Administered Date(s) Administered  .  Influenza, High Dose Seasonal PF 01/22/2016, 01/12/2018  . Influenza-Unspecified 01/26/2014, 01/16/2015, 02/09/2017  . Pneumococcal Conjugate-13 02/19/2015    Family History  Problem Relation Age of Onset  . Emphysema Mother   . Heart disease Mother   . Lung cancer Mother   . Prostate cancer Father   . Lung cancer Sister      Current Outpatient Medications:  .  aspirin 81  MG tablet, Take 81 mg by mouth daily., Disp: , Rfl:  .  atorvastatin (LIPITOR) 40 MG tablet, Take 40 mg by mouth daily., Disp: , Rfl:  .  cetirizine (ZYRTEC) 10 MG tablet, Take 10 mg by mouth daily., Disp: , Rfl: 4 .  clonazePAM (KLONOPIN) 0.5 MG tablet, Take 0.5 mg by mouth daily. , Disp: , Rfl:  .  EPINEPHrine (EPIPEN 2-PAK) 0.3 mg/0.3 mL IJ SOAJ injection, Inject 0.3 mg into the muscle as needed. , Disp: , Rfl:  .  ipratropium (ATROVENT) 0.06 % nasal spray, instill 2 sprays into each nostril twice daily as needed for drainage, Disp: , Rfl: 5 .  metoprolol tartrate (LOPRESSOR) 25 MG tablet, Take 1 tablet (25 mg total) by mouth at bedtime. (BETA BLOCKER) (Patient taking differently: Take 25 mg by mouth at bedtime. Taking 1/2 tablet  (BETA BLOCKER)), Disp: 90 tablet, Rfl: 3 .  nitroGLYCERIN (NITROSTAT) 0.4 MG SL tablet, Place 0.4 mg under the tongue every 5 (five) minutes as needed for chest pain., Disp: , Rfl:  .  omeprazole (PRILOSEC) 20 MG capsule, Take 20 mg by mouth daily. , Disp: , Rfl:  .  tiZANidine (ZANAFLEX) 4 MG tablet, Take 4 mg by mouth at bedtime. , Disp: , Rfl:       Objective:   Vitals:   12/28/18 1103  BP: 118/70  Pulse: 77  Temp: 98.2 F (36.8 C)  SpO2: 99%  Weight: 96 lb 3.2 oz (43.6 kg)  Height: 5\' 4"  (1.626 m)   99# in sept 2019  Estimated body mass index is 16.51 kg/m as calculated from the following:   Height as of this encounter: 5\' 4"  (1.626 m).   Weight as of this encounter: 96 lb 3.2 oz (43.6 kg).  @WEIGHTCHANGE @  2020   12/28/18 1103  Weight: 96 lb 3.2 oz (43.6 kg)   General Appearance:  Looks well Head:  Normocephalic, without obvious abnormality, atraumatic Eyes:  PERRL - yes, conjunctiva/corneas - clear     Ears:  Normal external ear canals, both ears Nose:  G tube - no Throat:  ETT TUBE - no , OG tube - no. POST NASAL DRIP +. CLEARS THROAT + Neck:  Supple,  No enlargement/tenderness/nodules Lungs: Clear to auscultation bilaterally,   Heart:  S1 and S2 normal, no murmur, CVP - no.  Pressors - no Abdomen:  Soft, no masses, no organomegaly Genitalia / Rectal:  Not done Extremities:  Extremities- intact Skin:  ntact in exposed areas . Sacral area - not examined Neurologic:  Sedation - none -> RASS - +1 . Moves all 4s - yes. CAM-ICU - neg . Orientation - x3+             Assessment:       ICD-10-CM   1. Chronic cough  R05   2. Irritable larynx  J38.7   3. 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 These sare improved as of Aug 2020 compared to march 2020 - high dose flu shot 12/28/2018    Plan  - CT chest without contrast in 12 months  Followup 6 months or sooner if needed     > 50% of this > 25 min visit spent in face to face counseling or coordination of care - by this undersigned MD - Dr Brand Males. This includes one or more of the following documented above: discussion of test results, diagnostic or treatment recommendations, prognosis, risks and benefits of management options, instructions, education, compliance or risk-factor reduction    SIGNATURE    Dr. Brand Males, M.D., F.C.C.P,  Pulmonary and Critical Care Medicine Staff Physician, [place] Director - Interstitial Lung Disease  Program  Pulmonary [place] at [place] [employer], [place], [address]   Pager: [phone], If no answer or between  15:00h - 7:00h: call [phone] Telephone: [phone]  11:42 AM 12/28/2018

## 2018-12-28 NOTE — Addendum Note (Signed)
Addended by: Nena Polio on: 12/28/2018 11:53 AM   Modules accepted: Orders

## 2019-01-12 DIAGNOSIS — Z79899 Other long term (current) drug therapy: Secondary | ICD-10-CM | POA: Diagnosis not present

## 2019-01-12 DIAGNOSIS — Z0000 Encounter for general adult medical examination without abnormal findings: Secondary | ICD-10-CM | POA: Diagnosis not present

## 2019-01-12 DIAGNOSIS — Z1339 Encounter for screening examination for other mental health and behavioral disorders: Secondary | ICD-10-CM | POA: Diagnosis not present

## 2019-01-12 DIAGNOSIS — E785 Hyperlipidemia, unspecified: Secondary | ICD-10-CM | POA: Diagnosis not present

## 2019-01-12 DIAGNOSIS — Z1331 Encounter for screening for depression: Secondary | ICD-10-CM | POA: Diagnosis not present

## 2019-01-12 DIAGNOSIS — I2510 Atherosclerotic heart disease of native coronary artery without angina pectoris: Secondary | ICD-10-CM | POA: Diagnosis not present

## 2019-01-12 DIAGNOSIS — Z7189 Other specified counseling: Secondary | ICD-10-CM | POA: Diagnosis not present

## 2019-01-12 DIAGNOSIS — Z1231 Encounter for screening mammogram for malignant neoplasm of breast: Secondary | ICD-10-CM | POA: Diagnosis not present

## 2019-01-17 DIAGNOSIS — R6889 Other general symptoms and signs: Secondary | ICD-10-CM | POA: Diagnosis not present

## 2019-01-17 DIAGNOSIS — R05 Cough: Secondary | ICD-10-CM | POA: Diagnosis not present

## 2019-01-17 DIAGNOSIS — R490 Dysphonia: Secondary | ICD-10-CM | POA: Diagnosis not present

## 2019-02-14 DIAGNOSIS — J383 Other diseases of vocal cords: Secondary | ICD-10-CM | POA: Diagnosis not present

## 2019-02-14 DIAGNOSIS — R6889 Other general symptoms and signs: Secondary | ICD-10-CM | POA: Diagnosis not present

## 2019-02-14 DIAGNOSIS — R05 Cough: Secondary | ICD-10-CM | POA: Diagnosis not present

## 2019-02-14 DIAGNOSIS — Z87891 Personal history of nicotine dependence: Secondary | ICD-10-CM | POA: Diagnosis not present

## 2019-02-14 DIAGNOSIS — R490 Dysphonia: Secondary | ICD-10-CM | POA: Diagnosis not present

## 2019-02-18 DIAGNOSIS — Z961 Presence of intraocular lens: Secondary | ICD-10-CM | POA: Diagnosis not present

## 2019-03-21 DIAGNOSIS — R6889 Other general symptoms and signs: Secondary | ICD-10-CM | POA: Diagnosis not present

## 2019-03-21 DIAGNOSIS — R05 Cough: Secondary | ICD-10-CM | POA: Diagnosis not present

## 2019-03-21 DIAGNOSIS — R490 Dysphonia: Secondary | ICD-10-CM | POA: Diagnosis not present

## 2019-03-23 ENCOUNTER — Other Ambulatory Visit: Payer: Self-pay

## 2019-05-02 DIAGNOSIS — R05 Cough: Secondary | ICD-10-CM | POA: Diagnosis not present

## 2019-05-02 DIAGNOSIS — R0982 Postnasal drip: Secondary | ICD-10-CM | POA: Diagnosis not present

## 2019-05-02 DIAGNOSIS — Z79899 Other long term (current) drug therapy: Secondary | ICD-10-CM | POA: Diagnosis not present

## 2019-05-02 DIAGNOSIS — R6889 Other general symptoms and signs: Secondary | ICD-10-CM | POA: Diagnosis not present

## 2019-06-20 ENCOUNTER — Encounter: Payer: Self-pay | Admitting: Internal Medicine

## 2019-06-20 ENCOUNTER — Other Ambulatory Visit: Payer: Self-pay

## 2019-06-20 ENCOUNTER — Ambulatory Visit (INDEPENDENT_AMBULATORY_CARE_PROVIDER_SITE_OTHER): Payer: Medicare Other | Admitting: Internal Medicine

## 2019-06-20 VITALS — BP 114/66 | HR 82 | Ht 64.0 in | Wt 99.8 lb

## 2019-06-20 DIAGNOSIS — R05 Cough: Secondary | ICD-10-CM

## 2019-06-20 DIAGNOSIS — R918 Other nonspecific abnormal finding of lung field: Secondary | ICD-10-CM | POA: Diagnosis not present

## 2019-06-20 DIAGNOSIS — Z7189 Other specified counseling: Secondary | ICD-10-CM | POA: Diagnosis not present

## 2019-06-20 DIAGNOSIS — R053 Chronic cough: Secondary | ICD-10-CM

## 2019-06-20 DIAGNOSIS — J387 Other diseases of larynx: Secondary | ICD-10-CM | POA: Diagnosis not present

## 2019-06-20 NOTE — Progress Notes (Signed)
Cardiology Office Note:    Date:  06/21/2019   ID:  Erin Black, DOB [birth year], MRN [id number]  PCP:  Ernestene Kiel, MD  Cardiologist:  Shirlee More, MD    Referring MD: Ernestene Kiel, MD    ASSESSMENT:    1. Coronary vasospasm (HCC)   2. Frequent PVCs   3. Hypotension, postural   4. Mixed hyperlipidemia    PLAN:    In order of problems listed above:  1. She has done well she has had no recurrent angina PVCs are not frequent or bothersome and she tolerates a low-dose beta-blocker and she has had no symptomatic hypotension no longer is taking midodrine.  Her dyslipidemia stable lipids are at target at this time I do not think she requires further cardiovascular evaluation.  Recent lipid profile is ideal.  She has no muscle weakness or pain.   Next appointment: 1 year   Medication Adjustments/Labs and Tests Ordered: Current medicines are reviewed at length with the patient today.  Concerns regarding medicines are outlined above.  Orders Placed This Encounter  Procedures  . EKG 12-Lead   No orders of the defined types were placed in this encounter.   Chief Complaint  Patient presents with  . Annual Exam    History of Present Illness:    Erin Black is a 69 y.o. female with a hx of STEMI with normal coronary angiography in 2015, frequent PVC's orthostatic hypotension syncope, Dyslipidemia,RA  and MAC pulmonary infection  last seen 05/07/2018. Compliance with diet, lifestyle and medications:Yes  Overall she is doing well her lung disease is stable she has palpitation but is not severe sustained or frequent and no episodes of syncope or near syncope.  On her own she reduced her dose of beta-blocker by 50%.  She has followed with rheumatology and with pulmonary and tells me she has nodules being evaluated by her pulmonologist.  Most recent labs performed on 01/12/2019 cholesterol 131 HDL 52 LDL 75 creatinine normal 0.86 Past Medical History:  Diagnosis  Date  . Anxiety disorder   . 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 Bronchiectasis without complication ([place])   . Chronic cough 06/20/2015  . Congestive heart failure ([place])   . DJD (degenerative joint disease)   . Elevated IgE level 08/11/2016  . Episode of syncope 10/31/2014  . Essential hypertension   . Hearing loss   . History of MAI infection 02/21/2016  . Hx of non-ST elevation myocardial infarction (NSTEMI) 04/11/2014  . 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 NSTEMI (non-ST elevated myocardial infarction) [facility]) Nov 2015  . Osteopenia   . Other acute recurrent sinusitis 05/13/2016  . Post-nasal drip 06/20/2015  . Pulmonary emphysema ([place]) 05/13/2016  . PVC's (premature ventricular contractions)   . RA (rheumatoid arthritis) ([place])   . Rheumatoid arthritis involving multiple sites ([place])   . Takotsubo cardiomyopathy   . UTI (lower urinary tract infection)     Past Surgical History:  Procedure Laterality Date  .  bilateral tubal ligation    . CARDIAC CATHETERIZATION    . LUMBAR SPINE SURGERY    . strabismus repair    . TONSILLECTOMY AND ADENOIDECTOMY    . VIDEO BRONCHOSCOPY Bilateral 05/17/2014   Procedure: VIDEO BRONCHOSCOPY WITHOUT FLUORO;  Surgeon: Elsie Stain, MD;  Location: [facility] ENDOSCOPY;  Service: Cardiopulmonary;  Laterality: Bilateral;    Current Medications: Current Meds    Medication Sig  . aspirin 81 MG tablet Take 81 mg by mouth daily.  Marland Kitchen atorvastatin (LIPITOR) 40 MG tablet Take 40 mg by mouth daily.  . cetirizine (ZYRTEC) 10 MG tablet Take 10 mg by mouth daily.  . clonazePAM (KLONOPIN) 0.5 MG tablet Take 0.5 mg by mouth daily.   Marland Kitchen EPINEPHrine (EPIPEN 2-PAK) 0.3 mg/0.3 mL IJ SOAJ injection Inject 0.3 mg into the muscle as needed.   Marland Kitchen ipratropium (ATROVENT) 0.06 % nasal spray instill 2 sprays into each nostril twice daily as needed for drainage  . metoprolol tartrate (LOPRESSOR) 25 MG tablet Take 1 tablet (25 mg total) by mouth at bedtime. (BETA BLOCKER) (Patient taking differently: Take 25 mg by mouth at bedtime. Taking 1/2 tablet  (BETA BLOCKER))  . nitroGLYCERIN (NITROSTAT) 0.4 MG SL tablet Place 0.4 mg under the tongue every 5 (five) minutes as needed for chest pain.  Marland Kitchen omeprazole (PRILOSEC) 20 MG capsule Take 20 mg by mouth daily.   Marland Kitchen tiZANidine (ZANAFLEX) 4 MG tablet Take 4 mg by mouth at bedtime.      Allergies:   Yellow jacket venom [bee venom], Omnicef [cefdinir], and Plavix [clopidogrel bisulfate]   Social History   Socioeconomic History  . Marital status: Married    Spouse name: Not on file  . Number of children: Not on file  . Years of education: Not on file  . Highest education level: Not on file  Occupational History  . Occupation: Programmer, multimedia: [employer]  Tobacco Use  . Smoking status: Former Smoker    Packs/day: 1.00    Years: 25.00    Pack years: 25.00    Types: Cigarettes    Quit date: 04/28/1998    Years since quitting: 21.1  . Smokeless tobacco: Never Used  Substance and Sexual Activity  . Alcohol use: Yes    Alcohol/week: 0.0 standard drinks    Comment: 1 drink weekly  . Drug use: No  . Sexual activity: Not on file  Other Topics Concern  . Not on file  Social History Narrative  . Not on file   Social Determinants of Health   Financial Resource Strain:   . Difficulty of Paying Living Expenses: Not on file   Food Insecurity:   . Worried About Charity fundraiser in the Last Year: Not on file  . Ran Out of Food in the Last Year: Not on file  Transportation Needs:   . Lack of Transportation (Medical): Not on file  . Lack of Transportation (Non-Medical): Not on file  Physical Activity:   . Days of Exercise per Week: Not on file  . Minutes of Exercise per Session: Not on file  Stress:   . Feeling of Stress : Not on file  Social Connections:   . Frequency of Communication with Friends and Family: Not on file  . Frequency of Social Gatherings with Friends and Family: Not on file  . Attends Religious Services: Not on file  . Active Member of Clubs or Organizations: Not on file  . Attends Archivist Meetings: Not  on file  . Marital Status: Not on file     Family History: The patient's family history includes Emphysema in her mother; Heart disease in her mother; Lung cancer in her mother and sister; Prostate cancer in her father. ROS:   Please see the history of present illness.    All other systems reviewed and are negative.  EKGs/Labs/Other Studies Reviewed:    The following studies were reviewed today:  EKG:  EKG ordered today and personally reviewed.  The ekg ordered today demonstrates [place] normal  Recent Labs: No results found for requested labs within last 8760 hours.  Recent Lipid Panel No results found for: CHOL, TRIG, HDL, CHOLHDL, VLDL, LDLCALC, LDLDIRECT  Physical Exam:    VS:  BP 120/60 (BP Location: Right Arm, Patient Position: Sitting, Cuff Size: Normal)   Pulse 86   Ht _0  (1.626 m)   Wt 99 lb 12.8 oz (45.3 kg)   SpO2 99%   BMI 17.13 kg/m     Wt Readings from Last 3 Encounters:  06/21/19 99 lb 12.8 oz (45.3 kg)  06/20/19 99 lb 12.8 oz (45.3 kg)  12/28/18 96 lb 3.2 oz (43.6 kg)     GEN:  Well nourished, well developed in no acute distress HEENT: Normal NECK: No JVD; No carotid bruits LYMPHATICS: No lymphadenopathy CARDIAC: RRR, no murmurs, rubs,  gallops RESPIRATORY:  Clear to auscultation without rales, wheezing or rhonchi  ABDOMEN: Soft, non-tender, non-distended MUSCULOSKELETAL:  No edema; No deformity  SKIN: Warm and dry NEUROLOGIC:  Alert and oriented x 3 PSYCHIATRIC:  Normal affect    Signed, Shirlee More, MD  06/21/2019 10:16 AM    [place]

## 2019-06-20 NOTE — Progress Notes (Signed)
OV 06/20/2015  Chief Complaint  Patient presents with  . Follow-up    Pt had induced sputum in 111/2016 - Saw Dr Ninetta Lights in 04/2015 and he stopped all 3 antibiotics -occas dry hacky cough - very little clear sputum - Denies sob, wheezing or chest discomfort    69 year old female with rheumatoid arthritis and was on methotrexate. 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 Presenting symptom was dyspnea in the background of baseline mild chronic cough. She finished 11 months of treatment. We could not get an induced sputum. I referred her to infectious diseases Dr. Ninetta Lights and based on clinical grounds he stopped MAI treatment. She is now here for follow-up. She tells me that with MAI treatment only thing that improved with the dyspnea. The baseline mild chronic cough never really changed. She still has it. She rates it as a 4 out of 10. It is associated with significant sinus drainage. She tells me that she uses Flonase for her sinus drainage [place] but even then she has copious sinus drainage all year around. It is perennial. At this point in time she just wants to have simple conservative measures. She does not want referral to ENT or allergy for this.    OV 02/21/2016  Chief Complaint  Patient presents with  . Follow-up    Pt c/o increase in prod coug with little mucus production light yellow. Pt states she was 10 days of abx, 3 days pred, and IM injection of [place]. Pt states she received this becuase of her acute sinusitis.Pt states she has had rocky mtn spotted fever since being seen last.     69 year old with RA. In feb 2016 dx with  MAI with bronch following only symptom of dyspnea (Did have previous baseline mild cough that was unchanged but no sputum or fever). After this and methotrexate was stopped and she was started on MAI treatment. Dyspnea resolved with this. Based on mild cough did not change. Baseline significant sinus drainage did not change. She finished 11  months of treatment and the end of 2016 early 2017 infectious diseases stop her treatment. When I saw in February 2017 she was feeling fine other than her baseline sinus drainage and cough. She comes in for office visit today she tells me that in August 2017 she ended up with fever, chills, right upper quadrant pain, night sweats cough worsening. She denies any tick bites. However 7/10 days into the illness she was diagnosed approximately one spotted fever based on blood tests and was given 10 days of doxycycline. Around the same was also given Nexium which he broke out in a rash around her abdomen. She denies this was shingles. Subsequent to the doxycycline her symptoms resolved except that she had persistent cough and sinus drainage. This was no worse than baseline. It was also progressive after initial improvement. Subsequently approximately 9 days ago primary care physician gave her 4 days of steroids and 10 days of Omnicef. She is now on the ninth of Omnicef. Towards the last day of Omnicef. She is beginning to feel better with the sinus drainage and cough but still much worse than baseline. In addition last night she had a fever of 102. She is now worried that her MAI is back. She is not on any rheumatoid arthritis treatment and she feels is under control  Exhaled nitric oxide is slightly elevated at 33 ppb  PLAN 1. CT chest no change in MAI nodulaoirty  compated to 2015 2. There is mild emphysema on CT - basis: prior smoking 3. Rt ethmodi sinus partially opacified  Plan  - try low dose symbicort or breo or dulera (hher feno was slightly high) - for cough  - daily netti pot and nasal steroid  - +/- refer ENT  - if she wishes   - I prefer to do above simplistic plan before rushing off to trying MAI rx ag  OV 05/13/2016  Chief Complaint  Patient presents with  . Follow-up    Pt c/o prod cough with green mucus, sinus congestion with white mucus, increase in SOB x weeks. Pt denies f/c/s.      A 69 year old female with history of MAI infection 2015 associated with presence of emphysema on CT chest October 2017 and recurrent sinus infection  Here for follow-up. In the last month or so she's had persistent sinus congestion associated with green sputum. The last few weeks it is worse. She is now on 5 days of Levaquin and is only partially resolved. She is having green sputum. At last visit because of the presence of emphysema in October 2017 I gave her Symbicort which did not help at a baseline state and is currently helping. Even though she is still significantly symptomatic with postnasal drainage and chest congestion. She feels it is descending into her chest. She is frustrated by constant sinus drainage and recurrent sinusitis. She has never seen ENT. CT sinus in October 2017 did show partially opacified right ethmoid sinus. sHe is also wondering about the fact if her MAI has recurred    OV 08/11/2016  Chief Complaint  Patient presents with  . Follow-up    Pt states her breathing is at baseline. Pt c/o PND, sinus congestion, cough with little mucus production with clear mucus. Pt states she had a sinus infection in March and her PCP gave her 30 days of Augmentin. Pt states she saw ENT, Dr. Jearld Fenton.     69 year old female with history of MAI infection 2015 manifested as dyspnea associated with presence of emphysema on CT chest October 2017 and recurrent sinusitis  After last visit in October 2017 at which time she was having significant sinus issues I referred her to ENT. Review of the note by ENT Dr. Jearld Fenton. The concern is that she might have allergy rhin as opposed to an infection. She was then referred to primary care physician for allergy evaluation. Blood test reviewed from March 2018 and shows elevated IgE in the records and trace positive Aspergillus antibody. She is again denying any asthma symptoms. She is only bothered by symptoms and sinus congestion despite taking Singulair  and Augmentin. Her voice is changing because of the nasal twang. She'll follow-up again with Dr. Jearld Fenton if she has a flareup. In terms of her MAI symptoms October 2017 CT chest did show some infiltrates consistent with MAI as opposed ABPA. She is not having much of pulmonary symptoms of shortness of breath or cough. At this point in time she will be content with observation expectant follow-up.   OV 02/16/2017  Chief Complaint  Patient presents with  . Follow-up    CT done 02/10/17.  Pt states that she has been doing good except she has had some sinus congestion due to allergies. C/o occ. cough and SOB especially when she has a flare up with her allergies and sinuses. Denies any CP. Pt states that her BP got as low as 60/40 Thursday morning, 73/61.   69 year old female  with history of MAI associated with mild bronchiectasis and recurrent sinusitis   Last visit was in April 2018 at the time because of mild emphysema on the CT scan with Spiriva. She only uses this as needed. She says it helps with the dyspnea and cough when she has a severe bout of cough associated with sinus congestion. She says that shortness of breath does not bother her as much. She wants to use his Spiriva as needed. She is asking for samples. Her main issues that she is still bothered by sinus congestion. She says that Dr. Jearld Fenton is labeled this is allergic rhinitis. But she seems very consumed by her chronic sinus congestion and postnasal drip and the cough associated with this. She says despite all these instructions from ENT Dr. Jearld Fenton she has not improved at all and it is very frustrating. She denies any vascular disease but she did admit to a history of rheumatoid arthritis diagnosed sometime around 2008-2010. Is unclear what treatment she is on. Off note she is scheduled for cataract surgery and she wants to know if it is okay to do this from a cough standpoint  IMPRESSION: 1. No acute cardiopulmonary abnormalities. 2. 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 3. Aortic Atherosclerosis (ICD10-I70.0) and Emphysema (ICD10-J43.9). Coronary artery calcifications noted in the LAD.   Electronically Signed   By: Signa Kell M.D.   On: 02/10/2017 13:57   OV 06/16/2017  Chief Complaint  Patient presents with  . Follow-up    Pt states she has seen multiple doctors due to issues with allergies. Pt does have an occ. cough from postnasal drainage and has SOB when coughing a lot. Pt stated Dr. Suszanne Conners said symptoms are from acid reflux.   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  Previous MAI infection with treatment and CT scan October 2018 with improvement.  Has chronic sinusitis  Since my last visit with her several months ago she says she has seen allergist Dr. [place] Callas.  Had extensive allergy testing was negative but nevertheless placed on Singulair.  Subsequently sent to second ENT opinion by Dr. Jodean Lima.  ENT exam apparently showed suggestive signs of acid reflux placed on Prilosec therapy.  Despite this chronic nasal congestion continues.  She is blowing out a lot of mucus in the morning.  In addition she is clearing the throat a lot.  She also has a cough.  She says the symptoms do not wake her up in the middle of the night at all.  Symptoms are present only in the daytime.  There is no chest pain.  Her voice feels hoarse and she has a ticklish sensation.    OV 01/12/2018  Subjective:  Patient ID: Erin Black, female , DOB: 1951/02/02 , age 48 y.o. , MRN: [id number] , ADDRESS: [address] [place] [address]   01/12/2018 -   Chief Complaint  Patient presents with  . Follow-up    Pt states she has been doing good since last visit. States she is still coughing some.     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  Previous MAI infection with treatment and CT scan October 2018 with improvement.  Has chronic sinusitis  and cough neuroapathy   HPI Erin Black 69 y.o. 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 Since last seeing me she went and saw ENT. She was advised voice rehabilitation and gabapentin but she has deferred this. She plans to see a  Rhinologist  at [facility] Dr Hope Pigeon. She continues to have mild sinus drainage andclearing of the throat all unchanged.  no wheezing no shortness of breath. No chest tightness. According to her history she recently saw primary care physician and the physical exam and blood work were normal otherwise. She will have a high dose flu shot today. She smoked heavily in the past quitting in the year 2000. She smoked 1.5 packs per day 30 of 40 years. A sister was diagnosed with lung cancer age of 40.     OV 07/13/2018  Subjective:  Patient ID: Erin Black, female , DOB: 02-14-1951 , age 74 y.o. , MRN: [id number] , ADDRESS: [address] [place] [address]   07/13/2018 -   Chief Complaint  Patient presents with  . 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  Previous MAI infection with treatment and CT scan October 2018 with improvement.  Has chronic sinusitis and cough neuroapathy     HPI Erin Black 69 y.o. -presents for follow-up.  Cough is fine.  Her husband had suspected influenza last week and he has recovered from it.  Patient herself is asymptomatic.  She had a lot of questions about coronavirus 19 and social distancing.  Otherwise she is well.  She had a CT scan of the chest that shows new onset of 8 mm right lower lobe nodule compared to October 2018.  She has a history of rheumatoid arthritis, MAI and previous smoking but also her sister died from lung cancer.  I personally visualized the nodule and does not look spiculated.   IMPRESSION: 1. Largely stable exam since 02/10/2017 although 5 mm right middle lobe and 8 mm right lower lobe pulmonary nodules are new in the interval. Non-contrast chest  CT at 3-6 months is recommended. If the nodules are stable at time of repeat CT, then future CT at 18-24 months (from today's scan) is considered optional for low-risk patients, but is recommended for high-risk patients. This recommendation follows the consensus statement: Guidelines for Management of Incidental Pulmonary Nodules Detected on CT Images: From the Fleischner Society 2017; Radiology 2017; 284:228-243. 2.  Emphysema. (ICD10-J43.9) 3.  Aortic Atherosclerois (ICD10-170.0)   Electronically Signed   By: Kennith Center M.D.   On: 07/09/2018 13:34  OV 12/28/2018  Subjective:  Patient ID: Erin Black, female , DOB: 1950-08-27 , age 64 y.o. , MRN: [id number] , ADDRESS: [address] [place] [address]   12/28/2018 -   Chief Complaint  Patient presents with  . Bronchiectasis without complication    Feels breathing is worse since last visit. Has some shortness of breath and wheeizing on occasion.   Known RA patient (prior mtx). Former smoker Previous MAI infection with treatment and CT scan October 2018 with improvement.  Lung nodules - improved march 2020 -> aug 2020 Has chronic sinusitis and cough neuroapathy    HPI Erin Black 69 y.o. -returns for follow-up.  Last visit was in the spring 2020.  At this point in time she has lost 3 pounds of weight but she says this is due to active exercise.  She continues to be bothered by her chronic cough with clearing of the throat.  She had an appointment at [facility] but because of the  COVID did not follow-up.  She is requesting a referral.  She also feels that her cough is causing shortness of breath.  Particularly early in the morning.  For the first 1 hour after she wakes up when she moves around she feels dyspneic.  She is constantly clearing her throat.  She feels a constant presence of postnasal drip.  She also has lung nodules.  We were concerned this might be cancer.  She did a follow-up CT scan of the  chest in August 2020.  I reviewed the report.  1 of the nodules is better if the other one is stable.  Her neck CT scan is now due in 1 year.  On the CT scan there is no evidence of bronchiectasis reported or COPD or interstitial lung disease.  During her regular walks she does not feel much dyspnea.  She is keen to have the high-dose flu shot today.    CT chest aug 2020 CLINICAL DATA:  F/u solitary pulmonary nodule No hx of sx No hx of CA Former smokerLung nodule, >=1cm  EXAM: CT CHEST WITHOUT CONTRAST  TECHNIQUE: Multidetector CT imaging of the chest was performed following the standard protocol without IV contrast.  COMPARISON:  CT July 09, 2018  FINDINGS: Cardiovascular: Coronary artery calcification and aortic atherosclerotic calcification.  Mediastinum/Nodes: No axillary or supraclavicular adenopathy. No mediastinal hilar adenopathy. No pericardial effusion. Esophagus normal.  Lungs/Pleura: In the RIGHT lower lobe nodule of concern measures 3 mm x 3 mm compared to 8 mm x 8 mm for adduction size (image 95/3) nodule in the RIGHT middle lobe measures 4 mm (image 88/3 unchanged from 5 mm. No new nodularity.  Upper Abdomen: Limited view of the liver, kidneys, pancreas are unremarkable. Normal adrenal glands.  Musculoskeletal: No aggressive osseous lesion.  IMPRESSION: 1. Decrease in size of the larger RIGHT lower lobe pulmonary nodule suggests benign etiology. 2. Stable RIGHT middle lobe pulmonary nodule at 4-5 mm. Consider follow-up CT scan in 12 month per Fleischner criteria.   Electronically Signed   By: Suzy Bouchard M.D.   On: 12/20/2018 15:19 ROS - per HPI   OV 06/20/2019  Subjective:  Patient ID: Erin Black, female , DOB: [birth year] , age 89 y.o. , MRN: [id number] , ADDRESS: [address] [place] [address]   06/20/2019 -   Chief Complaint  Patient presents with  . Follow-up    Pt states she has been doing okay since last visit.  States she is still coughing.    Known RA patient (prior mtx). Former smoker Previous MAI infection with treatment and CT scan October 2018 with improvement.  Lung nodules - improved march 2020 -> aug 2020 Has chronic sinusitis and cough neuroapathy   HPI Erin Black 69 y.o. -returns for follow-up of the above issues.  In the interim she is visited Dr. Dennison Mascot right x2 and also done voice rehabilitation.  With this the cough is better.  RSI cough score is 11.  She asked Dr. Joya Gaskins if her sinus drainage will ever resolve and apparently he was pessimistic about it.  Nevertheless she is feeling better.  She does not have much of new symptoms  She has pulmonary nodules that require follow-up in the summer 2021 which would be a 1 year scan  She has a new question new issue about COVID-19 vaccine.  She is reluctant to take it because of bee allergy anaphylaxis history.  But she has had other vaccines without any problems.  Her husband did have COVID-19 vaccine without any issues.  We spent some time discussing this.  She feels social distancing and masking along with daily exercise and the availability of  monoclonal antibody treatment might be good enough risk protectors against Covid.    Dr Gretta Cool Reflux Symptom Index (> 13-15 suggestive of LPR cough) 0 -> 5  =  none ->severe problem 06/20/2019   Hoarseness of problem with voice 1  Clearing  Of Throat 2.5  Excess throat mucus or feeling of post nasal drip 4  Difficulty swallowing food, liquid or tablets 0  Cough after eating or lying down 0  Breathing difficulties or choking episodes 0  Troublesome or annoying cough 2.5  Sensation of something sticking in throat or lump in throat 1  Heartburn, chest pain, indigestion, or stomach acid coming up 0  TOTAL 11     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), Bronchiectasis without complication (HCC), Chronic cough  (06/20/2015), Congestive heart failure (HCC), DJD (degenerative joint disease), 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/11/2016), Episode of syncope (10/31/2014), Essential hypertension, Hearing loss, History of MAI infection (02/21/2016), non-ST elevation myocardial infarction (NSTEMI) (04/11/2014), Hypotension, 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), 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   reports that she quit smoking about 21 years ago. Her smoking use included cigarettes. She has a 25.00 pack-year smoking history. She has never used smokeless tobacco.  Past Surgical History:  Procedure Laterality Date  . bilateral tubal ligation    . CARDIAC CATHETERIZATION    . LUMBAR SPINE SURGERY    . strabismus repair    . TONSILLECTOMY AND ADENOIDECTOMY    . VIDEO BRONCHOSCOPY Bilateral 05/17/2014   Procedure: VIDEO BRONCHOSCOPY WITHOUT FLUORO;  Surgeon: Storm Frisk, MD;  Location: [facility]COPY;  Service: Cardiopulmonary;  Laterality: Bilateral;    Allergies  Allergen Reactions  . Yellow Jacket Venom [Bee Venom] Anaphylaxis    Hives/swelling  . Omnicef [Cefdinir]     rash  . Plavix [Clopidogrel Bisulfate]     rash    Immunization History  Administered Date(s) Administered  . Fluad Quad(high Dose 65+) 12/28/2018  . Influenza, High Dose Seasonal PF 01/22/2016, 01/12/2018  . Influenza-Unspecified 01/26/2014, 01/16/2015, 02/09/2017  . Pneumococcal Conjugate-13 02/19/2015    Family History  Problem Relation Age of  Onset  . Emphysema Mother   . Heart disease Mother   . Lung cancer Mother   . Prostate cancer Father   . Lung cancer Sister      Current Outpatient Medications:  .  aspirin 81 MG tablet, Take 81 mg by mouth daily., Disp: , Rfl:  .  atorvastatin (LIPITOR) 40 MG tablet, Take 40 mg by mouth daily., Disp: , Rfl:  .  cetirizine (ZYRTEC) 10 MG tablet, Take 10 mg by mouth daily., Disp: , Rfl: 4 .  clonazePAM (KLONOPIN) 0.5 MG tablet, Take 0.5 mg by mouth daily. , Disp: , Rfl:  .  EPINEPHrine (EPIPEN 2-PAK) 0.3 mg/0.3 mL IJ SOAJ injection, Inject 0.3 mg into the muscle as needed. , Disp: , Rfl:  .  ipratropium (ATROVENT) 0.06 % nasal spray, instill 2 sprays into each nostril twice daily as needed for drainage, Disp: , Rfl: 5 .  metoprolol tartrate (LOPRESSOR) 25 MG tablet, Take 1 tablet (25 mg total) by mouth at bedtime. (BETA BLOCKER) (  Patient taking differently: Take 25 mg by mouth at bedtime. Taking 1/2 tablet  (BETA BLOCKER)), Disp: 90 tablet, Rfl: 3 .  nitroGLYCERIN (NITROSTAT) 0.4 MG SL tablet, Place 0.4 mg under the tongue every 5 (five) minutes as needed for chest pain., Disp: , Rfl:  .  omeprazole (PRILOSEC) 20 MG capsule, Take 20 mg by mouth daily. , Disp: , Rfl:  .  tiZANidine (ZANAFLEX) 4 MG tablet, Take 4 mg by mouth at bedtime. , Disp: , Rfl:       Objective:   Vitals:   06/20/19 1207  BP: 114/66  Pulse: 82  SpO2: 97%  Weight: 99 lb 12.8 oz (45.3 kg)  Height:  (1.626 m)    Estimated body mass index is 17.13 kg/m as calculated from the following:   Height as of this encounter:  (1.626 m).   Weight as of this encounter: 99 lb 12.8 oz (45.3 kg).  @  [employer]   06/20/19 1207  Weight: 99 lb 12.8 oz (45.3 kg)     Physical Exam Thin lady alert and oriented x3.  Wearing a mask.  Oral cavity examined with the mask removed looks normal.  Clear to auscultation bilaterally.  Abdomen soft nontender no cyanosis no clubbing no edema.           Assessment:       ICD-10-CM   1. Advice given about COVID-19 virus infection  Z71.89   2. Irritable larynx  J38.7   3. Chronic cough  R05   4. Multiple lung nodules on CT  R91.8        Plan:     Patient Instructions     ICD-10-CM   1. Advice given about COVID-19 virus infection  Z71.89   2. Irritable larynx  J38.7   3. Chronic cough  R05   4. Multiple lung nodules on CT  R91.8    Covid vaccine advice -= new -There is elevated but still low risk in patients who have allergies to injectables or other vaccines in getting allergic reaction from COVID-19 vaccine - side effects are to be expected with COVID-19 vaccine -Unclear what the elevated risk with COVID-19 vaccine is in the background of anaphylactic reaction to bee venom -but I suspect low and somewhat elevated  -If you keen on having the vaccine then extended monitoring post vaccine can be done [usual protocol is for 30 minutes]  Plan  -Decision to take Covid vaccine will ultimately be your -call  Chronic cough Irritable larynx  - same but bothersome and might be contributing to shortness of breath - no evidence of ILD or COPD in agugust 2020 CT to account for shortness of breath  -glad Dr Delford Field is hel;ing you here-     Multiple lung nodules on CT in setting of History of rheumatoid arthritis, Hx of smoking.,  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 These sare improved as of Aug 2020 compared to march 2020  Plan  - CT chest without contrast in 12 months from august 2020 - this will be august/sept 2021    Followup August/September 2021 but after CT scan of the chest        SIGNATURE    Dr. Kalman Shan, M.D., F.C.C.P,  Pulmonary and Critical Care Medicine Staff Physician, [facility] Director - Interstitial Lung Disease  Program  [place] [facility] [place], [place], [address]  Pager: [phone], If no answer or between  15:00h - 7:00h: call [phone] Telephone: [phone]  2:56 PM 06/20/2019

## 2019-06-20 NOTE — Patient Instructions (Addendum)
ICD-10-CM   1. Advice given about COVID-19 virus infection  Z71.89   2. Irritable larynx  J38.7   3. Chronic cough  R05   4. Multiple lung nodules on CT  R91.8    Covid vaccine advice -= new -There is elevated but still low risk in patients who have allergies to injectables or other vaccines in getting allergic reaction from COVID-19 vaccine - side effects are to be expected with COVID-19 vaccine -Unclear what the elevated risk with COVID-19 vaccine is in the background of anaphylactic reaction to bee venom -but I suspect low and somewhat elevated  -If you keen on having the vaccine then extended monitoring post vaccine can be done [usual protocol is for 30 minutes]  Plan  -Decision to take Covid vaccine will ultimately be your -call  Chronic cough Irritable larynx  - same but bothersome and might be contributing to shortness of breath - no evidence of ILD or COPD in agugust 2020 CT to account for shortness of breath  -glad Dr Delford Field is hel;ing you here-     Multiple lung nodules on CT in setting of History of rheumatoid arthritis, Hx of smoking.,  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 These sare improved as of Aug 2020 compared to march 2020  Plan  - CT chest without contrast in 12 months from august 2020 - this will be august/sept 2021    Followup August/September 2021 but after CT scan of the chest

## 2019-06-21 ENCOUNTER — Ambulatory Visit (INDEPENDENT_AMBULATORY_CARE_PROVIDER_SITE_OTHER): Payer: Medicare Other | Admitting: Cardiology

## 2019-06-21 ENCOUNTER — Encounter: Payer: Self-pay | Admitting: Cardiology

## 2019-06-21 VITALS — BP 120/60 | HR 86 | Ht 64.0 in | Wt 99.8 lb

## 2019-06-21 DIAGNOSIS — I201 Angina pectoris with documented spasm: Secondary | ICD-10-CM | POA: Diagnosis not present

## 2019-06-21 DIAGNOSIS — I493 Ventricular premature depolarization: Secondary | ICD-10-CM

## 2019-06-21 DIAGNOSIS — E782 Mixed hyperlipidemia: Secondary | ICD-10-CM | POA: Diagnosis not present

## 2019-06-21 DIAGNOSIS — I951 Orthostatic hypotension: Secondary | ICD-10-CM

## 2019-06-21 NOTE — Patient Instructions (Signed)
Medication Instructions:  Your physician recommends that you continue on your current medications as directed. Please refer to the Current Medication list given to you today.  *If you need a refill on your cardiac medications before your next appointment, please call your pharmacy*  Lab Work: None If you have labs (blood work) drawn today and your tests are completely normal, you will receive your results only by: . MyChart Message (if you have MyChart) OR . A paper copy in the mail If you have any lab test that is abnormal or we need to change your treatment, we will call you to review the results.  Testing/Procedures: nOne  Follow-Up: At [facility], you and your health needs are our priority.  As part of our continuing mission to provide you with exceptional heart care, we have created designated Provider Care Teams.  These Care Teams include your primary Cardiologist (physician) and Advanced Practice Providers (APPs -  Physician Assistants and Nurse Practitioners) who all work together to provide you with the care you need, when you need it.  Your next appointment:   1 year(s)  The format for your next appointment:   In Person  Provider:   Brian Munley, MD  Other Instructions   

## 2019-07-07 DIAGNOSIS — K219 Gastro-esophageal reflux disease without esophagitis: Secondary | ICD-10-CM | POA: Diagnosis not present

## 2019-07-07 DIAGNOSIS — Z1331 Encounter for screening for depression: Secondary | ICD-10-CM | POA: Diagnosis not present

## 2019-07-07 DIAGNOSIS — I2510 Atherosclerotic heart disease of native coronary artery without angina pectoris: Secondary | ICD-10-CM | POA: Diagnosis not present

## 2019-07-07 DIAGNOSIS — E785 Hyperlipidemia, unspecified: Secondary | ICD-10-CM | POA: Diagnosis not present

## 2019-07-07 DIAGNOSIS — Z79899 Other long term (current) drug therapy: Secondary | ICD-10-CM | POA: Diagnosis not present

## 2019-07-07 DIAGNOSIS — F419 Anxiety disorder, unspecified: Secondary | ICD-10-CM | POA: Diagnosis not present

## 2019-08-06 DIAGNOSIS — Z23 Encounter for immunization: Secondary | ICD-10-CM | POA: Diagnosis not present

## 2019-11-04 DIAGNOSIS — L237 Allergic contact dermatitis due to plants, except food: Secondary | ICD-10-CM | POA: Diagnosis not present

## 2020-01-03 ENCOUNTER — Encounter (HOSPITAL_COMMUNITY): Payer: Self-pay

## 2020-01-03 ENCOUNTER — Ambulatory Visit (HOSPITAL_COMMUNITY)
Admission: RE | Admit: 2020-01-03 | Discharge: 2020-01-03 | Disposition: A | Discharge status: Home / Self Care | Payer: Medicare Other | Source: Ambulatory Visit | Attending: Internal Medicine | Admitting: Internal Medicine

## 2020-01-03 ENCOUNTER — Other Ambulatory Visit: Payer: Self-pay

## 2020-01-03 DIAGNOSIS — I700 Atherosclerosis of aorta: Secondary | ICD-10-CM | POA: Diagnosis not present

## 2020-01-03 DIAGNOSIS — R918 Other nonspecific abnormal finding of lung field: Secondary | ICD-10-CM | POA: Diagnosis not present

## 2020-01-03 DIAGNOSIS — J984 Other disorders of lung: Secondary | ICD-10-CM | POA: Diagnosis not present

## 2020-01-03 DIAGNOSIS — I2510 Atherosclerotic heart disease of native coronary artery without angina pectoris: Secondary | ICD-10-CM | POA: Diagnosis not present

## 2020-01-03 DIAGNOSIS — J439 Emphysema, unspecified: Secondary | ICD-10-CM | POA: Diagnosis not present

## 2020-01-03 NOTE — Progress Notes (Signed)
Waxing and waning nodules. Will discuss at followup  IMPRESSION: 1. Waxing and waning of clustered tiny pulmonary nodules bilaterally. Right lower lobe nodule of concern shows continued further decrease in size with interval development of new clustered nodularity in the peripheral left lung today. Imaging features likely represent areas of scarring/atypical infection of varying chronicity. Dominant new nodule today is 7 mm in the left upper lobe. Non-contrast chest CT at 3-6 months is recommended. If the nodules are stable at time of repeat CT, then future CT at 18-24 months (from today's scan) is considered optional for low-risk patients, but is recommended for high-risk patients. This recommendation follows the consensus statement: Guidelines for Management of Incidental Pulmonary Nodules Detected on CT Images: From the Fleischner Society 2017; Radiology 2017; 284:228-243. 2. Other scattered tiny bilateral pulmonary nodules are stable in the interval. 3. Aortic Atherosclerosis (ICD10-I70.0) and Emphysema (ICD10-J43.9).   Electronically Signed   By: Kennith Center M.D.   On: 01/03/2020 11:16

## 2020-01-17 DIAGNOSIS — Z79899 Other long term (current) drug therapy: Secondary | ICD-10-CM | POA: Diagnosis not present

## 2020-01-17 DIAGNOSIS — M8580 Other specified disorders of bone density and structure, unspecified site: Secondary | ICD-10-CM | POA: Diagnosis not present

## 2020-01-17 DIAGNOSIS — D692 Other nonthrombocytopenic purpura: Secondary | ICD-10-CM | POA: Diagnosis not present

## 2020-01-17 DIAGNOSIS — E785 Hyperlipidemia, unspecified: Secondary | ICD-10-CM | POA: Diagnosis not present

## 2020-01-17 DIAGNOSIS — F419 Anxiety disorder, unspecified: Secondary | ICD-10-CM | POA: Diagnosis not present

## 2020-01-24 ENCOUNTER — Other Ambulatory Visit: Payer: Self-pay

## 2020-01-24 ENCOUNTER — Ambulatory Visit (INDEPENDENT_AMBULATORY_CARE_PROVIDER_SITE_OTHER): Payer: Medicare Other | Admitting: Internal Medicine

## 2020-01-24 ENCOUNTER — Encounter: Payer: Self-pay | Admitting: Internal Medicine

## 2020-01-24 VITALS — BP 100/60 | HR 99 | Temp 97.6°F | Ht 64.0 in | Wt 89.6 lb

## 2020-01-24 DIAGNOSIS — Z23 Encounter for immunization: Secondary | ICD-10-CM | POA: Diagnosis not present

## 2020-01-24 DIAGNOSIS — R918 Other nonspecific abnormal finding of lung field: Secondary | ICD-10-CM | POA: Diagnosis not present

## 2020-01-24 DIAGNOSIS — I201 Angina pectoris with documented spasm: Secondary | ICD-10-CM | POA: Diagnosis not present

## 2020-01-24 DIAGNOSIS — Z7189 Other specified counseling: Secondary | ICD-10-CM | POA: Diagnosis not present

## 2020-01-24 DIAGNOSIS — Z7185 Encounter for immunization safety counseling: Secondary | ICD-10-CM

## 2020-01-24 DIAGNOSIS — J387 Other diseases of larynx: Secondary | ICD-10-CM | POA: Diagnosis not present

## 2020-01-24 NOTE — Addendum Note (Signed)
Addended by: Delrae Rend on: 01/24/2020 03:59 PM   Modules accepted: Orders

## 2020-01-24 NOTE — Progress Notes (Signed)
OV 06/20/2015  Chief Complaint  Patient presents with  . Follow-up    Pt had induced sputum in 111/2016 - Saw Dr [name] in 04/2015 and he stopped all 3 antibiotics -occas dry hacky cough - very little clear sputum - Denies sob, wheezing or chest discomfort    69 year old female with rheumatoid arthritis and was on methotrexate. 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 Presenting symptom was dyspnea in the background of baseline mild chronic cough. She finished 11 months of treatment. We could not get an induced sputum. I referred her to infectious diseases Dr. [name] and based on clinical grounds he stopped MAI treatment. She is now here for follow-up. She tells me that with MAI treatment only thing that improved with the dyspnea. The baseline mild chronic cough never really changed. She still has it. She rates it as a 4 out of 10. It is associated with significant sinus drainage. She tells me that she uses Flonase for her sinus drainage [place] but even then she has copious sinus drainage all year around. It is perennial. At this point in time she just wants to have simple conservative measures. She does not want referral to ENT or allergy for this.    OV 02/21/2016  Chief Complaint  Patient presents with  . Follow-up    Pt c/o increase in prod coug with little mucus production light yellow. Pt states she was 10 days of abx, 3 days pred, and IM injection of [place]. Pt states she received this becuase of her acute sinusitis.Pt states she has had rocky mtn spotted fever since being seen last.     69 year old with RA. In feb 2016 dx with  MAI with bronch following only symptom of dyspnea (Did have previous baseline mild cough that was unchanged but no sputum or fever). After this and methotrexate was stopped and she was started on MAI treatment. Dyspnea resolved with this. Based on mild cough did not change. Baseline significant sinus drainage did not change. She finished  11 months of treatment and the end of 2016 early 2017 infectious diseases stop her treatment. When I saw in February 2017 she was feeling fine other than her baseline sinus drainage and cough. She comes in for office visit today she tells me that in August 2017 she ended up with fever, chills, right upper quadrant pain, night sweats cough worsening. She denies any tick bites. However 7/10 days into the illness she was diagnosed approximately one spotted fever based on blood tests and was given 10 days of doxycycline. Around the same was also given Nexium which he broke out in a rash around her abdomen. She denies this was shingles. Subsequent to the doxycycline her symptoms resolved except that she had persistent cough and sinus drainage. This was no worse than baseline. It was also progressive after initial improvement. Subsequently approximately 9 days ago primary care physician gave her 4 days of steroids and 10 days of Omnicef. She is now on the ninth of [place]. Towards the last day of Omnicef. She is beginning to feel better with the sinus drainage and cough but still much worse than baseline. In addition last night she had a fever of 102. She is now worried that her MAI is back. She is not on any rheumatoid arthritis treatment and she feels is under control  Exhaled nitric oxide is slightly elevated at 33 ppb  PLAN 1. CT chest no change in MAI  nodulaoirty compated to 2015 2. There is mild emphysema on CT - basis: prior smoking 3. Rt ethmodi sinus partially opacified  Plan  - try low dose symbicort or breo or dulera (hher feno was slightly high) - for cough  - daily netti pot and nasal steroid  - +/- refer ENT  - if she wishes   - I prefer to do above simplistic plan before rushing off to trying MAI rx ag  OV 05/13/2016  Chief Complaint  Patient presents with  . Follow-up    Pt c/o prod cough with green mucus, sinus congestion with white mucus, increase in SOB x weeks. Pt denies f/c/s.      A 69 year old female with history of MAI infection 2015 associated with presence of emphysema on CT chest October 2017 and recurrent sinus infection  Here for follow-up. In the last month or so she's had persistent sinus congestion associated with green sputum. The last few weeks it is worse. She is now on 5 days of Levaquin and is only partially resolved. She is having green sputum. At last visit because of the presence of emphysema in October 2017 I gave her Symbicort which did not help at a baseline state and is currently helping. Even though she is still significantly symptomatic with postnasal drainage and chest congestion. She feels it is descending into her chest. She is frustrated by constant sinus drainage and recurrent sinusitis. She has never seen ENT. CT sinus in October 2017 did show partially opacified right ethmoid sinus. sHe is also wondering about the fact if her MAI has recurred    OV 08/11/2016  Chief Complaint  Patient presents with  . Follow-up    Pt states her breathing is at baseline. Pt c/o PND, sinus congestion, cough with little mucus production with clear mucus. Pt states she had a sinus infection in March and her PCP gave her 30 days of Augmentin. Pt states she saw ENT, Dr. Janace Hoard.     69 year old female with history of MAI infection 2015 manifested as dyspnea associated with presence of emphysema on CT chest October 2017 and recurrent sinusitis  After last visit in October 2017 at which time she was having significant sinus issues I referred her to ENT. Review of the note by ENT Dr. Janace Hoard. The concern is that she might have allergy rhin as opposed to an infection. She was then referred to primary care physician for allergy evaluation. Blood test reviewed from March 2018 and shows elevated IgE in the records and trace positive Aspergillus antibody. She is again denying any asthma symptoms. She is only bothered by symptoms and sinus congestion despite taking Singulair  and Augmentin. Her voice is changing because of the nasal twang. She'll follow-up again with Dr. Janace Hoard if she has a flareup. In terms of her MAI symptoms October 2017 CT chest did show some infiltrates consistent with MAI as opposed ABPA. She is not having much of pulmonary symptoms of shortness of breath or cough. At this point in time she will be content with observation expectant follow-up.   OV 02/16/2017  Chief Complaint  Patient presents with  . Follow-up    CT done 02/10/17.  Pt states that she has been doing good except she has had some sinus congestion due to allergies. C/o occ. cough and SOB especially when she has a flare up with her allergies and sinuses. Denies any CP. Pt states that her BP got as low as 60/40 Thursday morning, 62/62.   69 year old  female with history of MAI associated with mild bronchiectasis and recurrent sinusitis   Last visit was in April 2018 at the time because of mild emphysema on the CT scan with Spiriva. She only uses this as needed. She says it helps with the dyspnea and cough when she has a severe bout of cough associated with sinus congestion. She says that shortness of breath does not bother her as much. She wants to use his Spiriva as needed. She is asking for samples. Her main issues that she is still bothered by sinus congestion. She says that Dr. Janace Hoard is labeled this is allergic rhinitis. But she seems very consumed by her chronic sinus congestion and postnasal drip and the cough associated with this. She says despite all these instructions from ENT Dr. Janace Hoard she has not improved at all and it is very frustrating. She denies any vascular disease but she did admit to a history of rheumatoid arthritis diagnosed sometime around 2008-2010. Is unclear what treatment she is on. Off note she is scheduled for cataract surgery and she wants to know if it is okay to do this from a cough standpoint  IMPRESSION: 1. No acute cardiopulmonary abnormalities. 2. 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 3. Aortic Atherosclerosis (ICD10-I70.0) and Emphysema (ICD10-J43.9). Coronary artery calcifications noted in the LAD.   Electronically Signed   By: Kerby Moors M.D.   On: 02/10/2017 13:57   OV 06/16/2017  Chief Complaint  Patient presents with  . Follow-up    Pt states she has seen multiple doctors due to issues with allergies. Pt does have an occ. cough from postnasal drainage and has SOB when coughing a lot. Pt stated Dr. Benjamine Mola said symptoms are from acid reflux.   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  Previous MAI infection with treatment and CT scan October 2018 with improvement.  Has chronic sinusitis  Since my last visit with her several months ago she says she has seen allergist Dr. Donneta Romberg.  Had extensive allergy testing was negative but nevertheless placed on Singulair.  Subsequently sent to second ENT opinion by Dr. Lorelee Cover.  ENT exam apparently showed suggestive signs of acid reflux placed on Prilosec therapy.  Despite this chronic nasal congestion continues.  She is blowing out a lot of mucus in the morning.  In addition she is clearing the throat a lot.  She also has a cough.  She says the symptoms do not wake her up in the middle of the night at all.  Symptoms are present only in the daytime.  There is no chest pain.  Her voice feels hoarse and she has a ticklish sensation.    OV 01/12/2018  Subjective:  Patient ID: Erin Black, female , DOB: 25-Sep-1950 , age 26 y.o. , MRN: [id number] , ADDRESS: [address] [place] [address]   01/12/2018 -   Chief Complaint  Patient presents with  . Follow-up    Pt states she has been doing good since last visit. States she is still coughing some.     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  Previous MAI infection with treatment and CT scan October 2018 with improvement.  Has chronic sinusitis  and cough neuroapathy   HPI Haidy Kackley 69 y.o. 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 Since last seeing me she went and saw ENT. She was advised voice rehabilitation and gabapentin but she has deferred this. She plans to see a  Rhinologist at [place]. She continues to have mild sinus drainage andclearing of the throat all unchanged.  no wheezing no shortness of breath. No chest tightness. According to her history she recently saw primary care physician and the physical exam and blood work were normal otherwise. She will have a high dose flu shot today. She smoked heavily in the past quitting in the year 2000. She smoked 1.5 packs per day 30 of 40 years. A sister was diagnosed with lung cancer age of 36.     OV 07/13/2018  Subjective:  Patient ID: Erin Black, female , DOB: [birth year] , age 64 y.o. , MRN: [id number] , ADDRESS: [address] [place] [address]   07/13/2018 -   Chief Complaint  Patient presents with  . 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  Previous MAI infection with treatment and CT scan October 2018 with improvement.  Has chronic sinusitis and cough neuroapathy     HPI Terin Dierolf 69 y.o. -presents for follow-up.  Cough is fine.  Her husband had suspected influenza last week and he has recovered from it.  Patient herself is asymptomatic.  She had a lot of questions about coronavirus 19 and social distancing.  Otherwise she is well.  She had a CT scan of the chest that shows new onset of 8 mm right lower lobe nodule compared to October 2018.  She has a history of rheumatoid arthritis, MAI and previous smoking but also her sister died from lung cancer.  I personally visualized the nodule and does not look spiculated.   IMPRESSION: 1. Largely stable exam since 02/10/2017 although 5 mm right middle lobe and 8 mm right lower lobe pulmonary nodules are new in the interval. Non-contrast chest  CT at 3-6 months is recommended. If the nodules are stable at time of repeat CT, then future CT at 18-24 months (from today's scan) is considered optional for low-risk patients, but is recommended for high-risk patients. This recommendation follows the consensus statement: Guidelines for Management of Incidental Pulmonary Nodules Detected on CT Images: From the Fleischner Society 2017; Radiology 2017; 284:228-243. 2.  Emphysema. (ICD10-J43.9) 3.  Aortic Atherosclerois (ICD10-170.0)   Electronically Signed   By: Misty Stanley M.D.   On: 07/09/2018 13:34  OV 12/28/2018  Subjective:  Patient ID: Erin Black, female , DOB: 1950-07-24 , age 38 y.o. , MRN: [id number] , ADDRESS: [address] [place] [address]   12/28/2018 -   Chief Complaint  Patient presents with  . Bronchiectasis without complication    Feels breathing is worse since last visit. Has some shortness of breath and wheeizing on occasion.   Known RA patient (prior mtx). Former smoker Previous MAI infection with treatment and CT scan October 2018 with improvement.  Lung nodules - improved march 2020 -> aug 2020 Has chronic sinusitis and cough neuroapathy    HPI Cassanda Walmer 69 y.o. -returns for follow-up.  Last visit was in the spring 2020.  At this point in time she has lost 3 pounds of weight but she says this is due to active exercise.  She continues to be bothered by her chronic cough with clearing of the throat.  She had an appointment at [facility] but because of the  [place] did not follow-up.  She is requesting a referral.  She also feels that her cough is causing shortness of breath.  Particularly early in the  morning.  For the first 1 hour after she wakes up when she moves around she feels dyspneic.  She is constantly clearing her throat.  She feels a constant presence of postnasal drip.  She also has lung nodules.  We were concerned this might be cancer.  She did a follow-up CT scan of the  chest in August 2020.  I reviewed the report.  1 of the nodules is better if the other one is stable.  Her neck CT scan is now due in 1 year.  On the CT scan there is no evidence of bronchiectasis reported or COPD or interstitial lung disease.  During her regular walks she does not feel much dyspnea.  She is keen to have the high-dose flu shot today.    CT chest aug 2020 CLINICAL DATA:  F/u solitary pulmonary nodule No hx of sx No hx of CA Former smokerLung nodule, >=1cm  EXAM: CT CHEST WITHOUT CONTRAST  TECHNIQUE: Multidetector CT imaging of the chest was performed following the standard protocol without IV contrast.  COMPARISON:  CT July 09, 2018  FINDINGS: Cardiovascular: Coronary artery calcification and aortic atherosclerotic calcification.  Mediastinum/Nodes: No axillary or supraclavicular adenopathy. No mediastinal hilar adenopathy. No pericardial effusion. Esophagus normal.  Lungs/Pleura: In the RIGHT lower lobe nodule of concern measures 3 mm x 3 mm compared to 8 mm x 8 mm for adduction size (image 95/3) nodule in the RIGHT middle lobe measures 4 mm (image 88/3 unchanged from 5 mm. No new nodularity.  Upper Abdomen: Limited view of the liver, kidneys, pancreas are unremarkable. Normal adrenal glands.  Musculoskeletal: No aggressive osseous lesion.  IMPRESSION: 1. Decrease in size of the larger RIGHT lower lobe pulmonary nodule suggests benign etiology. 2. Stable RIGHT middle lobe pulmonary nodule at 4-5 mm. Consider follow-up CT scan in 12 month per Fleischner criteria.   Electronically Signed   By: Suzy Bouchard M.D.   On: 12/20/2018 15:19 ROS - per HPI   OV 06/20/2019  Subjective:  Patient ID: Erin Black, female , DOB: 1951/03/21 , age 87 y.o. , MRN: [id number] , ADDRESS: [address] [place] [address]   06/20/2019 -   Chief Complaint  Patient presents with  . Follow-up    Pt states she has been doing okay since last visit.  States she is still coughing.    Known RA patient (prior mtx). Former smoker Previous MAI infection with treatment and CT scan October 2018 with improvement.  Lung nodules - improved march 2020 -> aug 2020 Has chronic sinusitis and cough neuroapathy   HPI Lyndzie Zentz 70 y.o. -returns for follow-up of the above issues.  In the interim she is visited Dr. Dennison Mascot right x2 and also done voice rehabilitation.  With this the cough is better.  RSI cough score is 11.  She asked Dr. Joya Gaskins if her sinus drainage will ever resolve and apparently he was pessimistic about it.  Nevertheless she is feeling better.  She does not have much of new symptoms  She has pulmonary nodules that require follow-up in the summer 2021 which would be a 1 year scan  She has a new question new issue about COVID-19 vaccine.  She is reluctant to take it because of bee allergy anaphylaxis history.  But she has had other vaccines without any problems.  Her husband did have COVID-19 vaccine without any issues.  We spent some time discussing this.  She feels social distancing and masking along with daily exercise and the  availability of monoclonal antibody treatment might be good enough risk protectors against Covid.    OV 01/24/2020   Subjective:  Patient ID: Erin Black, female , DOB: Dec 07, 1950, age 63 y.o. years. , MRN: [id number],  ADDRESS: [address] [place] [address] PCP  Ernestene Kiel, MD Providers : Treatment Team:  Attending Provider: Brand Males, MD  Known RA patient (prior mtx). Former smoker Previous MAI infection with treatment and CT scan October 2018 with improvement.  Lung nodules - improved march 2020 -> aug 2020 Has chronic sinusitis and cough neuroapathy    Chief Complaint  Patient presents with  . Follow-up    cough remains the same.  discuss CT scan.       HPI Analiyah Lechuga 69 y.o. -presents for follow-up.  In the interim she feels stable.  Cough and  irritable larynx syndrome and shortness of breath are minimal.  She continues to exercise regularly with walks.  She is lost weight because of that but this is intentional.  She has had a Covid vaccine [employer].  This gave her significant flulike symptoms and therefore she is reluctant to take the booster.  Overall she is stable.  She had a follow-up CT scan of the chest for 1 year follow-up of the lung nodules.  Her right-sided nodules are improved but she has a 7 mm new lung nodule but symptom wise she is not any different.  She will have a high-dose flu shot today.  There are no other issues.    Dr Lorenza Cambridge Reflux Symptom Index (> 13-15 suggestive of LPR cough) 0 -> 5  =  none ->severe problem 06/20/2019   Hoarseness of problem with voice 1  Clearing  Of Throat 2.5  Excess throat mucus or feeling of post nasal drip 4  Difficulty swallowing food, liquid or tablets 0  Cough after eating or lying down 0  Breathing difficulties or choking episodes 0  Troublesome or annoying cough 2.5  Sensation of something sticking in throat or lump in throat 1  Heartburn, chest pain, indigestion, or stomach acid coming up 0  TOTAL 11     CLINICAL DATA:  Abnormal x-ray.  Pulmonary nodule.  EXAM: CT CHEST WITHOUT CONTRAST  TECHNIQUE: Multidetector CT imaging of the chest was performed following the standard protocol without IV contrast.  COMPARISON:  Sep 19, 2018  FINDINGS: Cardiovascular: The heart size is normal. No substantial pericardial effusion. Coronary artery calcification is evident. Atherosclerotic calcification is noted in the wall of the thoracic aorta.  Mediastinum/Nodes: No mediastinal lymphadenopathy. No evidence for gross hilar lymphadenopathy although assessment is limited by the lack of intravenous contrast on today's study. The esophagus has normal imaging features. There is no axillary lymphadenopathy.  Lungs/Pleura: Centrilobular emphsyema noted.  Biapical pleuroparenchymal scarring evident. Subtle tree-in-bud nodularity posterior right upper lobe on 73/7 is stable. 4 mm posterior right upper lobe pulmonary nodule (97/7) is stable in the interval with adjacent tiny tree-in-bud nodules, likely sequelae of prior atypical infection. The 3 mm index nodule measured on the previous study that had decreased is similar at 2-3 mm today with interval resolution of the adjacent tiny nodule seen previously. Scarring in the base of the right middle lobe is unchanged.  There is a new cluster of nodularity in the left upper lobe with dominant nodule measuring 7 mm on image 97/7. Subtle new cluster of tiny nodules noted posterior left upper lobe on 97/7. Stable scarring in the inferior lingula  No focal airspace consolidation.  No pleural effusion.  Upper Abdomen: Unremarkable.  Musculoskeletal: No worrisome lytic or sclerotic osseous abnormality.  IMPRESSION: 1. Waxing and waning of clustered tiny pulmonary nodules bilaterally. Right lower lobe nodule of concern shows continued further decrease in size with interval development of new clustered nodularity in the peripheral left lung today. Imaging features likely represent areas of scarring/atypical infection of varying chronicity. Dominant new nodule today is 7 mm in the left upper lobe. Non-contrast chest CT at 3-6 months is recommended. If the nodules are stable at time of repeat CT, then future CT at 18-24 months (from today's scan) is considered optional for low-risk patients, but is recommended for high-risk patients. This recommendation follows the consensus statement: Guidelines for Management of Incidental Pulmonary Nodules Detected on CT Images: From the Fleischner Society 2017; Radiology 2017; 284:228-243. 2. Other scattered tiny bilateral pulmonary nodules are stable in the interval. 3. Aortic Atherosclerosis (ICD10-I70.0) and Emphysema  (ICD10-J43.9).   Electronically Signed   By: Kennith Center M.D.   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   reports that she quit smoking about 21 years ago. Her smoking use included cigarettes. She has a 25.00 pack-year smoking history. She has never used smokeless tobacco.  Past Surgical History:  Procedure Laterality Date  . bilateral tubal ligation    . CARDIAC CATHETERIZATION    . LUMBAR SPINE SURGERY    . strabismus repair    . TONSILLECTOMY AND ADENOIDECTOMY    . VIDEO BRONCHOSCOPY Bilateral 05/17/2014   Procedure: VIDEO BRONCHOSCOPY WITHOUT FLUORO;  Surgeon: Storm Frisk, MD;  Location: [facility];  Service: Cardiopulmonary;  Laterality: Bilateral;    Allergies  Allergen Reactions  . [place] Venom [Bee Venom] Anaphylaxis    Hives/swelling  . Omnicef [Cefdinir]     rash  . Plavix [Clopidogrel Bisulfate]     rash    Immunization History  Administered Date(s) Administered  .  Fluad Quad(high Dose 65+) 12/28/2018  . Influenza, High Dose Seasonal PF 01/22/2016, 01/12/2018  . Influenza-Unspecified 01/26/2014, 01/16/2015, 02/09/2017  . Janssen (J&J) SARS-COV-2 Vaccination 08/06/2019  . Pneumococcal Conjugate-13 02/19/2015    Family History  Problem Relation Age of Onset  . Emphysema Mother   . Heart disease Mother   . Lung cancer Mother   . Prostate cancer Father   . Lung cancer Sister      Current Outpatient Medications:  .  aspirin 81 MG tablet, Take 81 mg by mouth daily. Every other day per pcp, Disp: , Rfl:  .  atorvastatin (LIPITOR) 40 MG tablet, Take 40 mg by mouth daily., Disp: , Rfl:  .  cetirizine (ZYRTEC) 10 MG tablet, Take 10 mg by mouth daily., Disp: , Rfl: 4 .  clonazePAM (KLONOPIN) 0.5 MG tablet, Take 0.5 mg by mouth daily. , Disp: , Rfl:  .  EPINEPHrine (EPIPEN 2-PAK)  0.3 mg/0.3 mL IJ SOAJ injection, Inject 0.3 mg into the muscle as needed. , Disp: , Rfl:  .  ipratropium (ATROVENT) 0.06 % nasal spray, instill 2 sprays into each nostril twice daily as needed for drainage, Disp: , Rfl: 5 .  metoprolol tartrate (LOPRESSOR) 25 MG tablet, Take 1 tablet (25 mg total) by mouth at bedtime. (BETA BLOCKER) (Patient taking differently: Take 25 mg by mouth at bedtime. Taking 1/2 tablet  (BETA BLOCKER)), Disp: 90 tablet, Rfl: 3 .  tiZANidine (ZANAFLEX) 4 MG tablet, Take 4 mg by mouth at bedtime. , Disp: , Rfl:  .  nitroGLYCERIN (NITROSTAT) 0.4 MG SL tablet, Place 0.4 mg under the tongue every 5 (five) minutes as needed for chest pain. (Patient not taking: Reported on 01/24/2020), Disp: , Rfl:       Objective:   Vitals:   01/24/20 1512  BP: 100/60  Pulse: 99  Temp: 97.6 F (36.4 C)  TempSrc: Temporal  SpO2: 97%  Weight: 89 lb 9.6 oz (40.6 kg)  Height: 5\' 4"  (1.626 m)    Estimated body mass index is 15.38 kg/m as calculated from the following:   Height as of this encounter: 5\' 4"  (1.626 m).   Weight as of this encounter: 89 lb 9.6 oz (40.6  kg).  @WEIGHTCHANGE @    01/24/20 1512  Weight: 89 lb 9.6 oz (40.6 kg)     Physical Exam General: No distress. Looks well Neuro: Alert and Oriented x 3. GCS 15. Speech normal Psych: Pleasant Resp: Clear to ausucultation bilaterally. No wheeze No crackles CVS: Normal heart sounds. Murmurs - no HEENT: Normal upper airway. PEERL +. No post nasal drip       Assessment:       ICD-10-CM   1. Multiple lung nodules on CT  R91.8   2. Vaccine counseling  Z71.89   3. Irritable larynx  J38.7        Plan:     Patient Instructions     ICD-10-CM      ICD-10-CM   1. Multiple lung nodules on CT  R91.8   2. Vaccine counseling  Z71.89   3. 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   - right side nodules on CT Sept 2021 are improved compared to prior but you have new 76mm nodule on left upper side  Plan  - CT chest without contrast in 6 months from  Sept 2021 - which will be march 2022 - based on results might need bronch to see if MAI is back   VAccine counseling   high dose flu shot 01/24/2020    Followup March 2022 after ct chest        SIGNATURE    Dr. April 2022, M.D., F.C.C.P,  Pulmonary and Critical Care Medicine Staff Physician, [facility] Director - Interstitial Lung Disease  Program  [facility] at [facility], [facility], [facility]  Pager: [phone], If no answer or between  15:00h - 7:00h: call [phone] Telephone: [phone]  3:49 PM 01/24/2020

## 2020-01-24 NOTE — Patient Instructions (Signed)
ICD-10-CM      ICD-10-CM   1. Multiple lung nodules on CT  R91.8   2. Vaccine counseling  Z71.89   3. 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   - right side nodules on CT Sept 2021 are improved compared to prior but you have new 6mm nodule on left upper side  Plan  - CT chest without contrast in 6 months from  Sept 2021 - which will be march 2022 - based on results might need bronch to see if MAI is back   VAccine counseling   high dose flu shot 01/24/2020    Followup March 2022 after ct chest

## 2020-01-25 DIAGNOSIS — M899 Disorder of bone, unspecified: Secondary | ICD-10-CM | POA: Diagnosis not present

## 2020-03-09 ENCOUNTER — Other Ambulatory Visit: Payer: Self-pay | Admitting: Cardiology

## 2020-05-30 ENCOUNTER — Telehealth: Payer: Self-pay | Admitting: Internal Medicine

## 2020-05-30 DIAGNOSIS — J479 Bronchiectasis, uncomplicated: Secondary | ICD-10-CM

## 2020-05-30 NOTE — Telephone Encounter (Signed)
Spoke with the pt and notified of response per MR  She verbalized understanding  I have made referral and explained to the pt she may not get a call bc her results have not been documented as positive yet

## 2020-05-30 NOTE — Telephone Encounter (Signed)
Called and spoke with Patient. Patient stated she started having a productive cough, white to green tinted sputum, fatigue, headache, and throat tickle Monday.  Patient stated she is fully vaccinated. Patient she and her Husband had rapid covid testing yesterday.  Rapid covid test for Patient was negative, but her Husband was positive. Patient stated she has had a PCR test done, and results should be back tomorrow. Patient stated this morning she was more sob and experienced chest tightness.  Patient stated sob and chest tightness is improved, but she is constantly having to clear her throat. Patient stated she is concerned, because she has a MAI history. Patient stated she is moving around more and trying to stay hydrated, hoping that helps her feel better. Patient was wondering is she may need Prednisone or something else to help her? Patient request prescriptions to go to [facility] [place].  Message routed to Dr. Marchelle Gearing to advise

## 2020-05-30 NOTE — Telephone Encounter (Signed)
She very likely has Covid oh micron  Plan -Currently no need for prednisone but to stay hydrated and rest -= Make a referral to the infusion center/Covid respiratory clinic: She might be eligible for antivirals although the time for this might have elapsed.  She does need to call us with the updated results -Monitor pulse ox and if it is less than 88% to go to the ER -Monitor for wheezing and other symptoms

## 2020-05-30 NOTE — Telephone Encounter (Signed)
Attempted to call pt but unable to reach. Left message for her to return call. 

## 2020-05-31 ENCOUNTER — Telehealth: Payer: Self-pay

## 2020-05-31 ENCOUNTER — Telehealth: Payer: Self-pay | Admitting: Internal Medicine

## 2020-05-31 DIAGNOSIS — U071 COVID-19: Secondary | ICD-10-CM

## 2020-05-31 MED ORDER — NIRMATRELVIR/RITONAVIR (PAXLOVID)TABLET: 3.0000 | ORAL_TABLET | Freq: Two times a day (BID) | ORAL | 0 refills | Status: AC

## 2020-05-31 NOTE — Telephone Encounter (Signed)
Spoke with pt and advised of Dr Jane Canary recommendations. Pt has already had televisit with Covid clinic and is due to start Paxlovid.  Nothing further needed at this time.

## 2020-05-31 NOTE — Telephone Encounter (Signed)
Called to discuss with patient about COVID-19 symptoms and the use of one of the available treatments for those with mild to moderate Covid symptoms and at a high risk of hospitalization.  Pt appears to qualify for outpatient treatment due to co-morbid conditions and/or a member of an at-risk group in accordance with the FDA Emergency Use Authorization.    Symptom onset: 05/28/20 Vaccinated: Yes Booster? No Immunocompromised? No Qualifiers: COPD, HTN  Discussed with patient via phone. I have asked them to expect a call from APP to discuss treatment options. Moved to pre-screened list.   Erin Black

## 2020-05-31 NOTE — Addendum Note (Signed)
Addended by: Blanchard Kelch on: 05/31/2020 02:29 PM   Modules accepted: Orders

## 2020-05-31 NOTE — Telephone Encounter (Signed)
Verified symptoms started on Monday 1/31.  J&J vaccine received   Will call her PCP office to get current labs for consideration of Paxlovid given short onset of symptoms and   I called her PCP office and received verbal results:   September 2021:  Creatinine 0.70 BUN 6 eGFR 102 mL/min  Results will be faxed.    Outpatient Oral COVID Treatment Note  I connected with [name] on 05/31/2020/2:27 PM by telephone and verified that I am speaking with the correct person using two identifiers.  I discussed the limitations, risks, security, and privacy concerns of performing an evaluation and management service by telephone and the availability of in person appointments. I also discussed with the patient that there may be a patient responsible charge related to this service. The patient expressed understanding and agreed to proceed.  Patient location: [place], [place] Provider location: [facility]   Diagnosis: COVID-19 infection  Purpose of visit: Discussion of potential use of Molnupiravir or Paxlovid, a new treatment for mild to moderate COVID-19 viral infection in non-hospitalized patients.   Subjective: Patient is a 70 y.o. female who has been diagnosed with COVID 19 viral infection.  Their symptoms began on 05/28/2020 with URI and sore throat .    Past Medical History:  Diagnosis Date  . Anxiety disorder   . 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 Bronchiectasis without complication ([place])   . Chronic cough 06/20/2015  . Congestive heart failure ([place])   . DJD (degenerative joint disease)   . Elevated IgE level 08/11/2016  . Episode of syncope 10/31/2014  . Essential hypertension   . Hearing loss   . History of MAI infection 02/21/2016  . Hx of non-ST elevation myocardial infarction (NSTEMI) 04/11/2014  . 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 MAI (mycobacterium  avium-intracellulare) ([place]) 02/19/2015  . 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 NSTEMI (non-ST elevated myocardial infarction) [facility]) Nov 2015  . Osteopenia   . Other acute recurrent sinusitis 05/13/2016  . Post-nasal drip 06/20/2015  . Pulmonary emphysema ([place]) 05/13/2016  . PVC's (premature ventricular contractions)   . RA (rheumatoid arthritis) ([place])   . Rheumatoid arthritis involving multiple sites ([place])   . Takotsubo cardiomyopathy   . UTI (lower urinary tract infection)     Allergies  Allergen Reactions  . [place] Venom [Bee Venom] Anaphylaxis    Hives/swelling  . Omnicef [Cefdinir]     rash  . Plavix [Clopidogrel Bisulfate]     rash     Current Outpatient Medications:  .  aspirin 81 MG tablet, Take 81 mg by mouth daily. Every other day per pcp, Disp: , Rfl:  .  atorvastatin (LIPITOR) 40 MG tablet, Take 40 mg by mouth daily., Disp: , Rfl:  .  clonazePAM (KLONOPIN) 0.5 MG tablet, Take 0.5 mg by mouth daily., Disp: , Rfl:  .  EPINEPHrine 0.3 mg/0.3 mL IJ SOAJ injection, Inject 0.3 mg into the muscle as needed. , Disp: , Rfl:  .  ipratropium (ATROVENT) 0.06 % nasal spray, instill 2 sprays into each nostril  twice daily as needed for drainage, Disp: , Rfl: 5 .  metoprolol tartrate (LOPRESSOR) 25 MG tablet, Take 1 tablet (25 mg total) by mouth at bedtime. (BETA BLOCKER) (Patient taking differently: Take 25 mg by mouth at bedtime. Taking 1/2 tablet  (BETA BLOCKER)), Disp: 90 tablet, Rfl: 3 .  nirmatrelvir/ritonavir EUA (PAXLOVID) TABS, Take 3 tablets by mouth 2 (two) times daily for 5 days. Take  nirmatrelvir (150 mg) 2 tablet(s) twice daily for 5 days and ritonavir (100 mg) one tablet twice daily for 5 days., Disp: 30 tablet, Rfl: 0 .  tiZANidine (ZANAFLEX) 4 MG tablet, Take 4 mg by mouth at bedtime., Disp: , Rfl:  .  cetirizine (ZYRTEC) 10 MG tablet, Take 10 mg by mouth daily. (Patient not taking: Reported on 05/31/2020), Disp: , Rfl: 4 .  nitroGLYCERIN (NITROSTAT) 0.4 MG SL tablet, Place 0.4 mg under the tongue every 5 (five) minutes as needed for chest pain. (Patient not taking: Reported on 05/31/2020), Disp: , Rfl:   Objective: Patient appears/sounds mildly ill.  They are in no apparent distress.  Breathing is non labored.  Mood and behavior are normal.  Laboratory Data:  No results found for this or any previous visit (from the past 2160 hour(s)).   Assessment: 70 y.o. female with mild/moderate COVID 19 viral infection diagnosed on 2/2 at high risk for progression to severe COVID 19.  Plan:  This patient is a 70 y.o. female that meets the following criteria for Emergency Use Authorization of: Paxlovid 1. Age >12 yr AND > 40 kg 2. SARS-COV-2 positive test 3. Symptom onset < 5 days 4. Mild-to-moderate COVID disease with high risk for severe progression to hospitalization or death  I have spoken and communicated the following to the patient or parent/caregiver regarding: 1. Paxlovid is an unapproved drug that is authorized for use under an Emergency Use Authorization.  2. There are no adequate, approved, available products for the treatment of COVID-19 in adults who have mild-to-moderate COVID-19 and are at high risk for progressing to severe COVID-19, including hospitalization or death. 3. Other therapeutics are currently authorized. For additional information on all products authorized for treatment or prevention of COVID-19, please see [employer].  4. There are benefits  and risks of taking this treatment as outlined in the "Fact Sheet for Patients and Caregivers."  5. "Fact Sheet for Patients and Caregivers" was reviewed with patient. A hard copy will be provided to patient from pharmacy prior to the patient receiving treatment. 6. Patients should continue to self-isolate and use infection control measures (e.g., wear mask, isolate, social distance, avoid sharing personal items, clean and disinfect "high touch" surfaces, and frequent handwashing) according to CDC guidelines.  7. The patient or parent/caregiver has the option to accept or refuse treatment. 8. Patient medication history was reviewed for potential drug interactions:Interaction with home meds: She will hold her atorvastatin while on treatment  9. Patient's GFR was calculated to be 102, and they were therefore prescribed Normal dose (GFR>60) - nirmatrelvir 166m tab (2 tablet) by mouth twice daily AND ritonavir 1024mtab (1 tablet) by mouth twice daily   After reviewing above information with the patient, the patient agrees to receive Paxlovid.  Follow up instructions:    . Take prescription BID x 5 days as directed . Reach out to pharmacist for counseling on medication if desired . For concerns regarding further COVID symptoms please follow up with your PCP or urgent care . For urgent or life-threatening issues, seek care at your  local emergency department  The patient was provided an opportunity to ask questions, and all were answered. The patient agreed with the plan and demonstrated an understanding of the instructions.   Script sent to local pharmacy near patient in [place], [place] and opted to pick up RX.  The patient was advised to call their PCP or seek an in-person evaluation if the symptoms worsen or if the condition fails to improve as anticipated.   I provided 28 minutes of non face-to-face telephone visit time during this encounter, and > 50% was spent counseling as  documented under my assessment & plan.  Janene Madeira, NP 05/31/2020 /2:27 PM

## 2020-05-31 NOTE — Telephone Encounter (Signed)
Spoke with pt.  Rapid covid test negative on 05/29/20. PCT positive on 05/30/20. J&J vaccine 08/06/19. Symptoms started 05/28/20 with weakness an dry cough. Headache and diarrhea started 05/30/20. Today cough, headache and diarrhea are better. Had some chest tightness yesterday but denies SOB or wheezing. Pt does not have pulse ox at home.  Pt is concerned due to her h/o MAI. Please advise.

## 2020-05-31 NOTE — Telephone Encounter (Signed)
Sorry to hear.  Monitor pulse ox at home and if it goes below 88% she should go to the ER.  Otherwise please refer to the infusion center for antivirals to be given within first 3 days - 5 days.  Rest and hydrate otherwise

## 2020-06-01 ENCOUNTER — Ambulatory Visit: Payer: Medicare Other | Admitting: Internal Medicine

## 2020-07-02 DIAGNOSIS — F419 Anxiety disorder, unspecified: Secondary | ICD-10-CM | POA: Insufficient documentation

## 2020-07-02 DIAGNOSIS — M8580 Other specified disorders of bone density and structure, unspecified site: Secondary | ICD-10-CM | POA: Insufficient documentation

## 2020-07-02 DIAGNOSIS — I509 Heart failure, unspecified: Secondary | ICD-10-CM | POA: Insufficient documentation

## 2020-07-02 DIAGNOSIS — I493 Ventricular premature depolarization: Secondary | ICD-10-CM | POA: Insufficient documentation

## 2020-07-02 DIAGNOSIS — M069 Rheumatoid arthritis, unspecified: Secondary | ICD-10-CM | POA: Insufficient documentation

## 2020-07-02 DIAGNOSIS — M1990 Unspecified osteoarthritis, unspecified site: Secondary | ICD-10-CM | POA: Insufficient documentation

## 2020-07-02 DIAGNOSIS — H9190 Unspecified hearing loss, unspecified ear: Secondary | ICD-10-CM | POA: Insufficient documentation

## 2020-07-09 ENCOUNTER — Ambulatory Visit
Admission: RE | Admit: 2020-07-09 | Discharge: 2020-07-09 | Disposition: A | Discharge status: Home / Self Care | Payer: Medicare Other | Source: Ambulatory Visit | Attending: Internal Medicine | Admitting: Internal Medicine

## 2020-07-09 ENCOUNTER — Other Ambulatory Visit: Payer: Self-pay

## 2020-07-09 DIAGNOSIS — R918 Other nonspecific abnormal finding of lung field: Secondary | ICD-10-CM

## 2020-07-09 DIAGNOSIS — J984 Other disorders of lung: Secondary | ICD-10-CM | POA: Diagnosis not present

## 2020-07-09 DIAGNOSIS — J8410 Pulmonary fibrosis, unspecified: Secondary | ICD-10-CM | POA: Diagnosis not present

## 2020-07-09 DIAGNOSIS — J432 Centrilobular emphysema: Secondary | ICD-10-CM | POA: Diagnosis not present

## 2020-07-09 DIAGNOSIS — I2510 Atherosclerotic heart disease of native coronary artery without angina pectoris: Secondary | ICD-10-CM | POA: Diagnosis not present

## 2020-07-09 NOTE — Progress Notes (Signed)
Cardiology Office Note:    Date:  07/10/2020   ID:  Erin Black, DOB 1951/02/22, MRN [id number]  PCP:  Ernestene Kiel, MD  Cardiologist:  Shirlee More, MD    Referring MD: Ernestene Kiel, MD    ASSESSMENT:    1. Coronary vasospasm (HCC)   2. Frequent PVCs   3. Essential hypertension   4. Hypotension, postural   5. Mixed hyperlipidemia    PLAN:    In order of problems listed above:  1. Overall doing well anginal discomfort continue her current medical therapy including aspirin minimum dose of beta-blocker statin 2. Stable no clinical recurrence not on her EKG and I do not think she needs extended monitors at this time 3. Blood pressure well controlled minimal symptoms of orthostatic hypotension 4. Reduce her statin dose she is having weakness she is due for follow-up labs with her PCP   Next appointment: 1 year   Medication Adjustments/Labs and Tests Ordered: Current medicines are reviewed at length with the patient today.  Concerns regarding medicines are outlined above.  Orders Placed This Encounter  Procedures  . EKG 12-Lead   No orders of the defined types were placed in this encounter.   Chief Complaint  Patient presents with  . Follow-up    History of Present Illness:    Erin Black is a 70 y.o. female with a hx of [place] with ST elevation MI normal coronary angiography 2015, frequent PVCs, orthostatic hypotension with syncope, dyslipidemia rheumatoid arthritis and MAC pulmonary infection and pulmonary nodules.  She was last seen 06/21/2019. Compliance with diet, lifestyle and medications: Yes  Overall is doing well has no angina dyspnea palpitation or syncope. One evening she got out of bed and felt lightheaded she has had no recurrence she did not lose consciousness. He reduced aspirin to every other day but wants to go back daily. Dose was increased but she is not feeling as well and she is going to reduce back to 40 mg of atorvastatin  daily.  Her most recent lipid profile performed on 21 2021 shows cholesterol of 209 triglycerides 112 HDL 74 at that time her creatinine was 0.7. She had COVID-19 and was treated with Paxlovid recovery and minimal symptoms. She is pending a follow-up CT scan and think she may have another bronchial lavage performed Past Medical History:  Diagnosis Date  . Anxiety disorder   . 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 Bronchiectasis without complication ([place])   . Chronic cough 06/20/2015  . Congestive heart failure ([place])   . DJD (degenerative joint disease)   . Elevated IgE level 08/11/2016  . Episode of syncope 10/31/2014  . Essential hypertension   . Hearing loss   . History of MAI infection 02/21/2016  . Hx of non-ST elevation myocardial infarction (NSTEMI) 04/11/2014  . 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 MAI (mycobacterium avium-intracellulare) ([place]) 02/19/2015  . 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 NSTEMI (non-ST elevated myocardial infarction) [facility]) Nov 2015  . Osteopenia   . Other acute recurrent sinusitis 05/13/2016  .  Post-nasal drip 06/20/2015  . Pulmonary emphysema ([place]) 05/13/2016  . PVC's (premature ventricular contractions)   . RA (rheumatoid arthritis) ([place])   . Rheumatoid arthritis involving multiple sites ([place])   . Takotsubo cardiomyopathy   . UTI (lower urinary tract infection)     Past Surgical History:  Procedure Laterality Date  . bilateral tubal ligation    .  CARDIAC CATHETERIZATION    . LUMBAR SPINE SURGERY    . strabismus repair    . TONSILLECTOMY AND ADENOIDECTOMY    . VIDEO BRONCHOSCOPY Bilateral 05/17/2014   Procedure: VIDEO BRONCHOSCOPY WITHOUT FLUORO;  Surgeon: Elsie Stain, MD;  Location: [facility];  Service: Cardiopulmonary;  Laterality: Bilateral;    Current Medications: Current Meds  Medication Sig  . aspirin 81 MG tablet Take 81 mg by mouth daily. Every other day per pcp  . atorvastatin (LIPITOR) 80 MG tablet Take 80 mg by mouth daily.  . cetirizine (ZYRTEC) 10 MG tablet Take 10 mg by mouth daily.  . clonazePAM (KLONOPIN) 0.5 MG tablet Take 0.5 mg by mouth daily.  Marland Kitchen EPINEPHrine 0.3 mg/0.3 mL IJ SOAJ injection Inject 0.3 mg into the muscle as needed.   Marland Kitchen ipratropium (ATROVENT) 0.06 % nasal spray instill 2 sprays into each nostril twice daily as needed for drainage  . metoprolol tartrate (LOPRESSOR) 25 MG tablet Take 1 tablet (25 mg total) by mouth at bedtime. (BETA BLOCKER) (Patient taking differently: Take 25 mg by mouth at bedtime. Taking 1/2 tablet  (BETA BLOCKER))  . tiZANidine (ZANAFLEX) 4 MG tablet Take 4 mg by mouth at bedtime.     Allergies:   Yellow jacket venom [bee venom], Omnicef [cefdinir], and Plavix [clopidogrel bisulfate]   Social History   Socioeconomic History  . Marital status: Married    Spouse name: Not on file  . Number of children: Not on file  . Years of education: Not on file  . Highest education level: Not on file  Occupational History  . Occupation: Programmer, multimedia: [employer]  Tobacco Use  . Smoking status: Former Smoker    Packs/day: 1.00    Years: 25.00    Pack years: 25.00    Types: Cigarettes    Quit date: 04/28/1998    Years since quitting: 22.2  . Smokeless tobacco: Never Used  Vaping Use  . Vaping Use: Never used  Substance and Sexual Activity  . Alcohol use: Yes    Alcohol/week: 0.0 standard drinks    Comment: 1 drink weekly  . Drug use: No  . Sexual activity: Not  on file  Other Topics Concern  . Not on file  Social History Narrative  . Not on file   Social Determinants of Health   Financial Resource Strain: Not on file  Food Insecurity: Not on file  Transportation Needs: Not on file  Physical Activity: Not on file  Stress: Not on file  Social Connections: Not on file     Family History: The patient's family history includes Emphysema in her mother; Heart disease in her mother; Lung cancer in her mother and sister; Prostate cancer in her father. ROS:   Please see the history of present illness.    All other systems reviewed and are negative.  EKGs/Labs/Other Studies Reviewed:    The following studies were reviewed today:  EKG:  EKG ordered today and personally reviewed.  The ekg ordered today demonstrates sinus rhythm low voltage frontal leads otherwise normal no PVCs   Physical Exam:  VS:  There were no vitals taken for this visit.    Wt Readings from Last 3 Encounters:  01/24/20 89 lb 9.6 oz (40.6 kg)  06/21/19 99 lb 12.8 oz (45.3 kg)  06/20/19 99 lb 12.8 oz (45.3 kg)     GEN: Very thin small body habitus well nourished, well developed in no acute distress HEENT: Normal NECK: No JVD; No carotid bruits LYMPHATICS: No lymphadenopathy CARDIAC: RRR, no murmurs, rubs, gallops RESPIRATORY:  Clear to auscultation without rales, wheezing or rhonchi  ABDOMEN: Soft, non-tender, non-distended MUSCULOSKELETAL:  No edema; No deformity  SKIN: Warm and dry NEUROLOGIC:  Alert and oriented x 3 PSYCHIATRIC:  Normal affect    Signed, Shirlee More, MD  07/10/2020 9:57 AM    [facility]

## 2020-07-10 ENCOUNTER — Ambulatory Visit (INDEPENDENT_AMBULATORY_CARE_PROVIDER_SITE_OTHER): Payer: Medicare Other | Admitting: Cardiology

## 2020-07-10 ENCOUNTER — Other Ambulatory Visit: Payer: Self-pay

## 2020-07-10 ENCOUNTER — Encounter: Payer: Self-pay | Admitting: Cardiology

## 2020-07-10 DIAGNOSIS — E782 Mixed hyperlipidemia: Secondary | ICD-10-CM | POA: Diagnosis not present

## 2020-07-10 DIAGNOSIS — I951 Orthostatic hypotension: Secondary | ICD-10-CM

## 2020-07-10 DIAGNOSIS — I493 Ventricular premature depolarization: Secondary | ICD-10-CM

## 2020-07-10 DIAGNOSIS — I10 Essential (primary) hypertension: Secondary | ICD-10-CM

## 2020-07-10 DIAGNOSIS — I201 Angina pectoris with documented spasm: Secondary | ICD-10-CM

## 2020-07-10 NOTE — Patient Instructions (Signed)

## 2020-07-23 NOTE — Progress Notes (Signed)
Some worsening signs of mac. Please let her know ti keep appt 3.31.22. Consider Mac Rx reattenot ir reoet bronch v ovs

## 2020-07-26 ENCOUNTER — Ambulatory Visit (INDEPENDENT_AMBULATORY_CARE_PROVIDER_SITE_OTHER): Payer: Medicare Other | Admitting: Internal Medicine

## 2020-07-26 ENCOUNTER — Telehealth: Payer: Self-pay | Admitting: Internal Medicine

## 2020-07-26 ENCOUNTER — Encounter: Payer: Self-pay | Admitting: Internal Medicine

## 2020-07-26 ENCOUNTER — Other Ambulatory Visit: Payer: Self-pay

## 2020-07-26 VITALS — BP 112/60 | HR 69 | Temp 97.2°F | Ht 64.0 in | Wt 84.4 lb

## 2020-07-26 DIAGNOSIS — Z5181 Encounter for therapeutic drug level monitoring: Secondary | ICD-10-CM

## 2020-07-26 DIAGNOSIS — I201 Angina pectoris with documented spasm: Secondary | ICD-10-CM

## 2020-07-26 DIAGNOSIS — Z8619 Personal history of other infectious and parasitic diseases: Secondary | ICD-10-CM | POA: Diagnosis not present

## 2020-07-26 DIAGNOSIS — R918 Other nonspecific abnormal finding of lung field: Secondary | ICD-10-CM

## 2020-07-26 DIAGNOSIS — Z01812 Encounter for preprocedural laboratory examination: Secondary | ICD-10-CM

## 2020-07-26 NOTE — Progress Notes (Signed)
OV 06/20/2015  Chief Complaint  Patient presents with  . Follow-up    Pt had induced sputum in 111/2016 - Saw Dr [name] in 04/2015 and he stopped all 3 antibiotics -occas dry hacky cough - very little clear sputum - Denies sob, wheezing or chest discomfort    70 year old female with rheumatoid arthritis and was on methotrexate. 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 Presenting symptom was dyspnea in the background of baseline mild chronic cough. She finished 11 months of treatment. We could not get an induced sputum. I referred her to infectious diseases Dr. [name] and based on clinical grounds he stopped MAI treatment. She is now here for follow-up. She tells me that with MAI treatment only thing that improved with the dyspnea. The baseline mild chronic cough never really changed. She still has it. She rates it as a 4 out of 10. It is associated with significant sinus drainage. She tells me that she uses Flonase for her sinus drainage [place] but even then she has copious sinus drainage all year around. It is perennial. At this point in time she just wants to have simple conservative measures. She does not want referral to ENT or allergy for this.    OV 02/21/2016  Chief Complaint  Patient presents with  . Follow-up    Pt c/o increase in prod coug with little mucus production light yellow. Pt states she was 10 days of abx, 3 days pred, and IM injection of [place]. Pt states she received this becuase of her acute sinusitis.Pt states she has had rocky mtn spotted fever since being seen last.     70 year old with RA. In feb 2016 dx with  MAI with bronch following only symptom of dyspnea (Did have previous baseline mild cough that was unchanged but no sputum or fever). After this and methotrexate was stopped and she was started on MAI treatment. Dyspnea resolved with this. Based on mild cough did not change. Baseline significant sinus drainage did not change. She finished  11 months of treatment and the end of 2016 early 2017 infectious diseases stop her treatment. When I saw in February 2017 she was feeling fine other than her baseline sinus drainage and cough. She comes in for office visit today she tells me that in August 2017 she ended up with fever, chills, right upper quadrant pain, night sweats cough worsening. She denies any tick bites. However 7/10 days into the illness she was diagnosed approximately one spotted fever based on blood tests and was given 10 days of doxycycline. Around the same was also given Nexium which he broke out in a rash around her abdomen. She denies this was shingles. Subsequent to the doxycycline her symptoms resolved except that she had persistent cough and sinus drainage. This was no worse than baseline. It was also progressive after initial improvement. Subsequently approximately 9 days ago primary care physician gave her 4 days of steroids and 10 days of Omnicef. She is now on the ninth of [place]. Towards the last day of Omnicef. She is beginning to feel better with the sinus drainage and cough but still much worse than baseline. In addition last night she had a fever of 102. She is now worried that her MAI is back. She is not on any rheumatoid arthritis treatment and she feels is under control  Exhaled nitric oxide is slightly elevated at 33 ppb  PLAN 1. CT chest no change in MAI  nodulaoirty compated to 2015 2. There is mild emphysema on CT - basis: prior smoking 3. Rt ethmodi sinus partially opacified  Plan  - try low dose symbicort or breo or dulera (hher feno was slightly high) - for cough  - daily netti pot and nasal steroid  - +/- refer ENT  - if she wishes   - I prefer to do above simplistic plan before rushing off to trying MAI rx ag  OV 05/13/2016  Chief Complaint  Patient presents with  . Follow-up    Pt c/o prod cough with green mucus, sinus congestion with white mucus, increase in SOB x weeks. Pt denies f/c/s.      A 70 year old female with history of MAI infection 2015 associated with presence of emphysema on CT chest October 2017 and recurrent sinus infection  Here for follow-up. In the last month or so she's had persistent sinus congestion associated with green sputum. The last few weeks it is worse. She is now on 5 days of Levaquin and is only partially resolved. She is having green sputum. At last visit because of the presence of emphysema in October 2017 I gave her Symbicort which did not help at a baseline state and is currently helping. Even though she is still significantly symptomatic with postnasal drainage and chest congestion. She feels it is descending into her chest. She is frustrated by constant sinus drainage and recurrent sinusitis. She has never seen ENT. CT sinus in October 2017 did show partially opacified right ethmoid sinus. sHe is also wondering about the fact if her MAI has recurred    OV 08/11/2016  Chief Complaint  Patient presents with  . Follow-up    Pt states her breathing is at baseline. Pt c/o PND, sinus congestion, cough with little mucus production with clear mucus. Pt states she had a sinus infection in March and her PCP gave her 30 days of Augmentin. Pt states she saw ENT, Dr. Janace Hoard.     70 year old female with history of MAI infection 2015 manifested as dyspnea associated with presence of emphysema on CT chest October 2017 and recurrent sinusitis  After last visit in October 2017 at which time she was having significant sinus issues I referred her to ENT. Review of the note by ENT Dr. Janace Hoard. The concern is that she might have allergy rhin as opposed to an infection. She was then referred to primary care physician for allergy evaluation. Blood test reviewed from March 2018 and shows elevated IgE in the records and trace positive Aspergillus antibody. She is again denying any asthma symptoms. She is only bothered by symptoms and sinus congestion despite taking Singulair  and Augmentin. Her voice is changing because of the nasal twang. She'll follow-up again with Dr. Janace Hoard if she has a flareup. In terms of her MAI symptoms October 2017 CT chest did show some infiltrates consistent with MAI as opposed ABPA. She is not having much of pulmonary symptoms of shortness of breath or cough. At this point in time she will be content with observation expectant follow-up.   OV 02/16/2017  Chief Complaint  Patient presents with  . Follow-up    CT done 02/10/17.  Pt states that she has been doing good except she has had some sinus congestion due to allergies. C/o occ. cough and SOB especially when she has a flare up with her allergies and sinuses. Denies any CP. Pt states that her BP got as low as 60/40 Thursday morning, 62/62.   70 year old  female with history of MAI associated with mild bronchiectasis and recurrent sinusitis   Last visit was in April 2018 at the time because of mild emphysema on the CT scan with Spiriva. She only uses this as needed. She says it helps with the dyspnea and cough when she has a severe bout of cough associated with sinus congestion. She says that shortness of breath does not bother her as much. She wants to use his Spiriva as needed. She is asking for samples. Her main issues that she is still bothered by sinus congestion. She says that Dr. Janace Hoard is labeled this is allergic rhinitis. But she seems very consumed by her chronic sinus congestion and postnasal drip and the cough associated with this. She says despite all these instructions from ENT Dr. Janace Hoard she has not improved at all and it is very frustrating. She denies any vascular disease but she did admit to a history of rheumatoid arthritis diagnosed sometime around 2008-2010. Is unclear what treatment she is on. Off note she is scheduled for cataract surgery and she wants to know if it is okay to do this from a cough standpoint  IMPRESSION: 1. No acute cardiopulmonary abnormalities. 2. 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 3. Aortic Atherosclerosis (ICD10-I70.0) and Emphysema (ICD10-J43.9). Coronary artery calcifications noted in the LAD.   Electronically Signed   By: Kerby Moors M.D.   On: 02/10/2017 13:57   OV 06/16/2017  Chief Complaint  Patient presents with  . Follow-up    Pt states she has seen multiple doctors due to issues with allergies. Pt does have an occ. cough from postnasal drainage and has SOB when coughing a lot. Pt stated Dr. Benjamine Mola said symptoms are from acid reflux.   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  Previous MAI infection with treatment and CT scan October 2018 with improvement.  Has chronic sinusitis  Since my last visit with her several months ago she says she has seen allergist Dr. Donneta Romberg.  Had extensive allergy testing was negative but nevertheless placed on Singulair.  Subsequently sent to second ENT opinion by Dr. Lorelee Cover.  ENT exam apparently showed suggestive signs of acid reflux placed on Prilosec therapy.  Despite this chronic nasal congestion continues.  She is blowing out a lot of mucus in the morning.  In addition she is clearing the throat a lot.  She also has a cough.  She says the symptoms do not wake her up in the middle of the night at all.  Symptoms are present only in the daytime.  There is no chest pain.  Her voice feels hoarse and she has a ticklish sensation.    OV 01/12/2018  Subjective:  Patient ID: Erin Black, female , DOB: 25-Sep-1950 , age 26 y.o. , MRN: [id number] , ADDRESS: [address] [place] [address]   01/12/2018 -   Chief Complaint  Patient presents with  . Follow-up    Pt states she has been doing good since last visit. States she is still coughing some.     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  Previous MAI infection with treatment and CT scan October 2018 with improvement.  Has chronic sinusitis  and cough neuroapathy   HPI Haidy Kackley 70 y.o. 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 Since last seeing me she went and saw ENT. She was advised voice rehabilitation and gabapentin but she has deferred this. She plans to see a  Rhinologist at [place]. She continues to have mild sinus drainage andclearing of the throat all unchanged.  no wheezing no shortness of breath. No chest tightness. According to her history she recently saw primary care physician and the physical exam and blood work were normal otherwise. She will have a high dose flu shot today. She smoked heavily in the past quitting in the year 2000. She smoked 1.5 packs per day 30 of 40 years. A sister was diagnosed with lung cancer age of 36.     OV 07/13/2018  Subjective:  Patient ID: Erin Black, female , DOB: [birth year] , age 64 y.o. , MRN: [id number] , ADDRESS: [address] [place] [address]   07/13/2018 -   Chief Complaint  Patient presents with  . 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  Previous MAI infection with treatment and CT scan October 2018 with improvement.  Has chronic sinusitis and cough neuroapathy     HPI Terin Dierolf 70 y.o. -presents for follow-up.  Cough is fine.  Her husband had suspected influenza last week and he has recovered from it.  Patient herself is asymptomatic.  She had a lot of questions about coronavirus 19 and social distancing.  Otherwise she is well.  She had a CT scan of the chest that shows new onset of 8 mm right lower lobe nodule compared to October 2018.  She has a history of rheumatoid arthritis, MAI and previous smoking but also her sister died from lung cancer.  I personally visualized the nodule and does not look spiculated.   IMPRESSION: 1. Largely stable exam since 02/10/2017 although 5 mm right middle lobe and 8 mm right lower lobe pulmonary nodules are new in the interval. Non-contrast chest  CT at 3-6 months is recommended. If the nodules are stable at time of repeat CT, then future CT at 18-24 months (from today's scan) is considered optional for low-risk patients, but is recommended for high-risk patients. This recommendation follows the consensus statement: Guidelines for Management of Incidental Pulmonary Nodules Detected on CT Images: From the Fleischner Society 2017; Radiology 2017; 284:228-243. 2.  Emphysema. (ICD10-J43.9) 3.  Aortic Atherosclerois (ICD10-170.0)   Electronically Signed   By: Misty Stanley M.D.   On: 07/09/2018 13:34  OV 12/28/2018  Subjective:  Patient ID: Erin Black, female , DOB: 1950-07-24 , age 38 y.o. , MRN: [id number] , ADDRESS: [address] [place] [address]   12/28/2018 -   Chief Complaint  Patient presents with  . Bronchiectasis without complication    Feels breathing is worse since last visit. Has some shortness of breath and wheeizing on occasion.   Known RA patient (prior mtx). Former smoker Previous MAI infection with treatment and CT scan October 2018 with improvement.  Lung nodules - improved march 2020 -> aug 2020 Has chronic sinusitis and cough neuroapathy    HPI Cassanda Walmer 70 y.o. -returns for follow-up.  Last visit was in the spring 2020.  At this point in time she has lost 3 pounds of weight but she says this is due to active exercise.  She continues to be bothered by her chronic cough with clearing of the throat.  She had an appointment at [facility] but because of the  [place] did not follow-up.  She is requesting a referral.  She also feels that her cough is causing shortness of breath.  Particularly early in the  morning.  For the first 1 hour after she wakes up when she moves around she feels dyspneic.  She is constantly clearing her throat.  She feels a constant presence of postnasal drip.  She also has lung nodules.  We were concerned this might be cancer.  She did a follow-up CT scan of the  chest in August 2020.  I reviewed the report.  1 of the nodules is better if the other one is stable.  Her neck CT scan is now due in 1 year.  On the CT scan there is no evidence of bronchiectasis reported or COPD or interstitial lung disease.  During her regular walks she does not feel much dyspnea.  She is keen to have the high-dose flu shot today.    CT chest aug 2020 CLINICAL DATA:  F/u solitary pulmonary nodule No hx of sx No hx of CA Former smokerLung nodule, >=1cm  EXAM: CT CHEST WITHOUT CONTRAST  TECHNIQUE: Multidetector CT imaging of the chest was performed following the standard protocol without IV contrast.  COMPARISON:  CT July 09, 2018  FINDINGS: Cardiovascular: Coronary artery calcification and aortic atherosclerotic calcification.  Mediastinum/Nodes: No axillary or supraclavicular adenopathy. No mediastinal hilar adenopathy. No pericardial effusion. Esophagus normal.  Lungs/Pleura: In the RIGHT lower lobe nodule of concern measures 3 mm x 3 mm compared to 8 mm x 8 mm for adduction size (image 95/3) nodule in the RIGHT middle lobe measures 4 mm (image 88/3 unchanged from 5 mm. No new nodularity.  Upper Abdomen: Limited view of the liver, kidneys, pancreas are unremarkable. Normal adrenal glands.  Musculoskeletal: No aggressive osseous lesion.  IMPRESSION: 1. Decrease in size of the larger RIGHT lower lobe pulmonary nodule suggests benign etiology. 2. Stable RIGHT middle lobe pulmonary nodule at 4-5 mm. Consider follow-up CT scan in 12 month per Fleischner criteria.   Electronically Signed   By: Suzy Bouchard M.D.   On: 12/20/2018 15:19 ROS - per HPI   OV 06/20/2019  Subjective:  Patient ID: Erin Black, female , DOB: 1951/03/21 , age 87 y.o. , MRN: [id number] , ADDRESS: [address] [place] [address]   06/20/2019 -   Chief Complaint  Patient presents with  . Follow-up    Pt states she has been doing okay since last visit.  States she is still coughing.    Known RA patient (prior mtx). Former smoker Previous MAI infection with treatment and CT scan October 2018 with improvement.  Lung nodules - improved march 2020 -> aug 2020 Has chronic sinusitis and cough neuroapathy   HPI Lyndzie Zentz 70 y.o. -returns for follow-up of the above issues.  In the interim she is visited Dr. Dennison Mascot right x2 and also done voice rehabilitation.  With this the cough is better.  RSI cough score is 11.  She asked Dr. Joya Gaskins if her sinus drainage will ever resolve and apparently he was pessimistic about it.  Nevertheless she is feeling better.  She does not have much of new symptoms  She has pulmonary nodules that require follow-up in the summer 2021 which would be a 1 year scan  She has a new question new issue about COVID-19 vaccine.  She is reluctant to take it because of bee allergy anaphylaxis history.  But she has had other vaccines without any problems.  Her husband did have COVID-19 vaccine without any issues.  We spent some time discussing this.  She feels social distancing and masking along with daily exercise and the  availability of monoclonal antibody treatment might be good enough risk protectors against Covid.    OV 01/24/2020   Subjective:  Patient ID: Erin Black, female , DOB: Dec 07, 1950, age 63 y.o. years. , MRN: [id number],  ADDRESS: [address] [place] [address] PCP  Ernestene Kiel, MD Providers : Treatment Team:  Attending Provider: Brand Males, MD  Known RA patient (prior mtx). Former smoker Previous MAI infection with treatment and CT scan October 2018 with improvement.  Lung nodules - improved march 2020 -> aug 2020 Has chronic sinusitis and cough neuroapathy    Chief Complaint  Patient presents with  . Follow-up    cough remains the same.  discuss CT scan.       HPI Analiyah Lechuga 70 y.o. -presents for follow-up.  In the interim she feels stable.  Cough and  irritable larynx syndrome and shortness of breath are minimal.  She continues to exercise regularly with walks.  She is lost weight because of that but this is intentional.  She has had a Covid vaccine [employer].  This gave her significant flulike symptoms and therefore she is reluctant to take the booster.  Overall she is stable.  She had a follow-up CT scan of the chest for 1 year follow-up of the lung nodules.  Her right-sided nodules are improved but she has a 7 mm new lung nodule but symptom wise she is not any different.  She will have a high-dose flu shot today.  There are no other issues.    Dr Lorenza Cambridge Reflux Symptom Index (> 13-15 suggestive of LPR cough) 0 -> 5  =  none ->severe problem 06/20/2019   Hoarseness of problem with voice 1  Clearing  Of Throat 2.5  Excess throat mucus or feeling of post nasal drip 4  Difficulty swallowing food, liquid or tablets 0  Cough after eating or lying down 0  Breathing difficulties or choking episodes 0  Troublesome or annoying cough 2.5  Sensation of something sticking in throat or lump in throat 1  Heartburn, chest pain, indigestion, or stomach acid coming up 0  TOTAL 11     CLINICAL DATA:  Abnormal x-ray.  Pulmonary nodule.  EXAM: CT CHEST WITHOUT CONTRAST  TECHNIQUE: Multidetector CT imaging of the chest was performed following the standard protocol without IV contrast.  COMPARISON:  Sep 19, 2018  FINDINGS: Cardiovascular: The heart size is normal. No substantial pericardial effusion. Coronary artery calcification is evident. Atherosclerotic calcification is noted in the wall of the thoracic aorta.  Mediastinum/Nodes: No mediastinal lymphadenopathy. No evidence for gross hilar lymphadenopathy although assessment is limited by the lack of intravenous contrast on today's study. The esophagus has normal imaging features. There is no axillary lymphadenopathy.  Lungs/Pleura: Centrilobular emphsyema noted.  Biapical pleuroparenchymal scarring evident. Subtle tree-in-bud nodularity posterior right upper lobe on 73/7 is stable. 4 mm posterior right upper lobe pulmonary nodule (97/7) is stable in the interval with adjacent tiny tree-in-bud nodules, likely sequelae of prior atypical infection. The 3 mm index nodule measured on the previous study that had decreased is similar at 2-3 mm today with interval resolution of the adjacent tiny nodule seen previously. Scarring in the base of the right middle lobe is unchanged.  There is a new cluster of nodularity in the left upper lobe with dominant nodule measuring 7 mm on image 97/7. Subtle new cluster of tiny nodules noted posterior left upper lobe on 97/7. Stable scarring in the inferior lingula  No focal airspace consolidation.  No pleural effusion.  Upper Abdomen: Unremarkable.  Musculoskeletal: No worrisome lytic or sclerotic osseous abnormality.  IMPRESSION: 1. Waxing and waning of clustered tiny pulmonary nodules bilaterally. Right lower lobe nodule of concern shows continued further decrease in size with interval development of new clustered nodularity in the peripheral left lung today. Imaging features likely represent areas of scarring/atypical infection of varying chronicity. Dominant new nodule today is 7 mm in the left upper lobe. Non-contrast chest CT at 3-6 months is recommended. If the nodules are stable at time of repeat CT, then future CT at 18-24 months (from today's scan) is considered optional for low-risk patients, but is recommended for high-risk patients. This recommendation follows the consensus statement: Guidelines for Management of Incidental Pulmonary Nodules Detected on CT Images: From the Fleischner Society 2017; Radiology 2017; 284:228-243. 2. Other scattered tiny bilateral pulmonary nodules are stable in the interval. 3. Aortic Atherosclerosis (ICD10-I70.0) and Emphysema  (ICD10-J43.9).   Electronically Signed   By: Misty Stanley M.D.   On: 01/03/2020 11:16   OV 07/26/2020  Subjective:  Patient ID: Erin Black, female , DOB: 1951/04/12 , age 38 y.o. , MRN: [id number] , ADDRESS: [address] [place] [address] PCP Ernestene Kiel, MD Patient Care Team: Ernestene Kiel, MD as PCP - General (Internal Medicine) Elsie Stain, MD as Consulting Physician (Pulmonary Disease) Richardo Priest, MD as Referring Physician (Cardiology)  This Provider for this visit: Treatment Team:  Attending Provider: Brand Males, MD    07/26/2020 -   Chief Complaint  Patient presents with  . Follow-up    Doing well, review CT    Known RA patient (prior mtx). Former smoker Previous MAI infection with treatment and CT scan October 2018 with improvement.  Lung nodules - improved march 2020 -> aug 2020 Has chronic sinusitis and cough neuroapathy  HPI Maxyne Derocher 70 y.o. -returns for follow-up.  She is feeling well.  She had follow-up CT scan of the chest to follow-up of one single nodule that was getting worse potentially but then there are many more nodules and infiltrates are worse.  I visualized the CT scan with and compared to September 2021 definitely agree with the radiologist below.  There are no other new issues.  We discussed the possibility that the MAI infection is back.  She agrees with it.  She is surprised because she took 11 months of treatment.  We discussed in do sputum but she has tried this in the past and that did not work.    CT Chest data 07/09/20   IMPRESSION: 1. Multiple scattered bilateral areas of clustered and tree-in-bud centrilobular nodularity are again seen. Several areas of nodularity are new or worsened compared to prior examination, for example in the medial left upper lobe and in both the medial and lateral segments of the right middle lobe. 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 2. Emphysema. 3. Coronary artery disease.  Aortic Atherosclerosis (ICD10-I70.0) and Emphysema (ICD10-J43.9).   Electronically Signed   By: Eddie Candle M.D.   On: 07/09/2020 13:54   No results found.    PFT  No flowsheet data found.     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   reports that she quit smoking about 22 years ago. Her smoking use included cigarettes. She has a 25.00 pack-year smoking history. She has never used smokeless tobacco.  Past Surgical History:  Procedure Laterality Date  . bilateral tubal ligation    . CARDIAC CATHETERIZATION    . LUMBAR SPINE SURGERY    . strabismus repair    . TONSILLECTOMY AND ADENOIDECTOMY    . VIDEO BRONCHOSCOPY Bilateral 05/17/2014   Procedure: VIDEO BRONCHOSCOPY WITHOUT FLUORO;  Surgeon: Elsie Stain, MD;  Location: [facility];  Service: Cardiopulmonary;  Laterality: Bilateral;    Allergies  Allergen Reactions  . Yellow  Jacket Venom [Bee Venom] Anaphylaxis    Hives/swelling  . Omnicef [Cefdinir]     rash  . Plavix [Clopidogrel Bisulfate]     rash    Immunization History  Administered Date(s) Administered  . Fluad Quad(high Dose 65+) 12/28/2018, 01/24/2020  . Influenza, High Dose Seasonal PF 01/22/2016, 01/12/2018  . Influenza-Unspecified 01/26/2014, 01/16/2015, 02/09/2017  . Janssen (J&J) SARS-COV-2 Vaccination 08/06/2019  . Pneumococcal Conjugate-13 02/19/2015    Family History  Problem Relation Age of Onset  . Emphysema Mother   . Heart disease Mother   . Lung cancer Mother   . Prostate cancer Father   . Lung cancer Sister      Current Outpatient Medications:  .  aspirin 81 MG tablet, Take 81 mg by mouth daily. Every other day per pcp, Disp: , Rfl:  .  atorvastatin (LIPITOR) 80 MG tablet, Take 40 mg by mouth daily., Disp: , Rfl:  .  cetirizine (ZYRTEC) 10 MG tablet, Take 10 mg by mouth daily., Disp: , Rfl: 4 .  clonazePAM (KLONOPIN) 0.5 MG tablet, Take 0.5 mg by mouth daily., Disp: , Rfl:  .  EPINEPHrine 0.3 mg/0.3 mL IJ SOAJ injection, Inject 0.3 mg into the muscle as needed. , Disp: , Rfl:  .  ipratropium (ATROVENT) 0.06 % nasal spray, instill 2 sprays into each nostril twice daily as needed for drainage, Disp: , Rfl: 5 .  metoprolol tartrate (LOPRESSOR) 25 MG tablet, Take 1 tablet (25 mg total) by mouth at bedtime. (BETA BLOCKER) (Patient taking differently: Take 12.5 mg by mouth at bedtime. Taking 1/2 tablet  (BETA BLOCKER)), Disp: 90 tablet, Rfl: 3 .  nitroGLYCERIN (NITROSTAT) 0.4 MG SL tablet, Place 0.4 mg under the tongue every 5 (five) minutes as needed for chest pain., Disp: , Rfl:  .  tiZANidine (ZANAFLEX) 4 MG tablet, Take 4 mg by mouth at bedtime., Disp: , Rfl:       Objective:   Vitals:   07/26/20 1449  BP: 112/60  Pulse: 69  Temp: (!) 97.2 F (36.2 C)  TempSrc: Oral  SpO2: 98%  Weight: 84 lb 6.4 oz (38.3 kg)  Height: _0  (1.626 m)    Estimated body mass index  is 14.49 kg/m as calculated from the following:   Height as of this encounter: _1  (1.626 m).   Weight as of this encounter: 84 lb 6.4 oz (38.3 kg).  _2 @  Filed Weights   07/26/20 1449  Weight: 84 lb 6.4 oz (38.3 kg)  Physical Exam  General: No distress. Lean bmi Neuro: Alert and Oriented x 3. GCS 15. Speech normal Psych: Pleasant Resp:  Barrel Chest - no.  Wheeze - no, Crackles - no, No overt respiratory distress CVS: Normal heart sounds. Murmurs - no Ext: Stigmata of Connective Tissue Disease - no HEENT: Normal upper airway. PEERL +. No post nasal drip        Assessment:       ICD-10-CM   1. Multiple lung nodules on CT  R91.8   2. History of MAI infection  Z86.19        Plan:     Patient Instructions     ICD-10-CM   1. Multiple lung nodules on CT  R91.8   2. 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   - right side nodules on CT March 2022 is worse compared to  Sept 2021 raising concern MAI infection is back  Plan  - bronchoscopy with BAL at [facility] long as outpatient - in April or may 2022 - stay tuned - take print out of ct chest report  Followup  -  end of may 2022 or early June 2022 to discuss results     SIGNATURE    Dr. Brand Males, M.D., F.C.C.P,  Pulmonary and Critical Care Medicine Staff Physician, [place] Director - Interstitial Lung Disease  Program  Pulmonary [place] at [place], [place], [address]  Pager: [phone], If no answer or between  15:00h - 7:00h: call [phone] Telephone: [phone]  3:36 PM 07/26/2020

## 2020-07-26 NOTE — Telephone Encounter (Signed)
PATIENT: Erin Black GENDER: female MRN: [id number] DOB: 02-08-1951 ADDRESS: [address] [place] [address]    Please schedule the following:  Diagnosis: hx of MAI. Now with worsening nodules Procedure: Video bronchocoopy, flexible bronchoscopy with BAL +/- Endobronchial biopsy Envisia Classifer Transbronchial biopsy: NO Anesthesia: no Do you need Fluro? no Size of Scope: regular Pre-med nebulized lidocaine: yes Priority: may 4-6 Date: above Alternate Date: aboc3  Time: AMany/ PMany but ideally after lunch and make sure only 1 bronch per day Location: [facility] Does patient have OSA? no DM? no Or Latex allergy? no Medication Restriction: no Anticoagulate/Antiplatelet: no Pre-op Labs Ordered: CBC, CMP, PT/INR, PTT Imaging request: na       MISCELLANEOUS KEY INSTRUCTIONS    Please coordinate Pre-op COVID Testing   Please let Dr Chase Caller know via reply phone message on Epic  Thank you     Key patient medical info     Allergy History:  Allergies  Allergen Reactions  . Yellow Jacket Venom [Bee Venom] Anaphylaxis    Hives/swelling  . Omnicef [Cefdinir]     rash  . Plavix [Clopidogrel Bisulfate]     rash     Current Outpatient Medications:  .  aspirin 81 MG tablet, Take 81 mg by mouth daily. Every other day per pcp, Disp: , Rfl:  .  atorvastatin (LIPITOR) 80 MG tablet, Take 40 mg by mouth daily., Disp: , Rfl:  .  cetirizine (ZYRTEC) 10 MG tablet, Take 10 mg by mouth daily., Disp: , Rfl: 4 .  clonazePAM (KLONOPIN) 0.5 MG tablet, Take 0.5 mg by mouth daily., Disp: , Rfl:  .  EPINEPHrine 0.3 mg/0.3 mL IJ SOAJ injection, Inject 0.3 mg into the muscle as needed. , Disp: , Rfl:  .  ipratropium (ATROVENT) 0.06 % nasal spray, instill 2 sprays into each nostril twice daily as needed for drainage, Disp: , Rfl: 5 .  metoprolol tartrate (LOPRESSOR) 25 MG tablet, Take 1 tablet (25 mg total) by mouth at bedtime. (BETA BLOCKER) (Patient taking differently: Take 12.5 mg by  mouth at bedtime. Taking 1/2 tablet  (BETA BLOCKER)), Disp: 90 tablet, Rfl: 3 .  nitroGLYCERIN (NITROSTAT) 0.4 MG SL tablet, Place 0.4 mg under the tongue every 5 (five) minutes as needed for chest pain., Disp: , Rfl:  .  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    has a past surgical history that includes Tonsillectomy and adenoidectomy; bilateral tubal ligation; strabismus repair; Lumbar spine surgery; Video bronchoscopy (Bilateral, 05/17/2014); and Cardiac catheterization.   SIGNATURE    Dr. Brand Males, M.D., F.C.C.P,  Pulmonary and Critical Care Medicine Staff Physician, [place] Director - Interstitial Lung Disease  Program  Pulmonary [place] at [place], [place], [address]  Pager:  [phone], If no answer or between  15:00h - 7:00h: call [phone] Telephone: [phone]  3:33 PM 07/26/2020

## 2020-07-26 NOTE — Telephone Encounter (Signed)
ATC patient unable to reach  When patient calls back please   Let them know their Bronch is scheduled for 08/29/20 with Dr. Marchelle Gearing at 1pm at [facility].  Patient is to arrive at hospital at 11:30 am. They were instructed to bring someone with them as they will not be able to drive home from procedure. Patient instructed not to have anything to eat or drink after midnight.   Patient's covid screening is scheduled at 0845 for 08/27/20 at [address] [facility] location.  Patient voiced understanding, nothing further needed  Routing to MR as [facility]

## 2020-07-26 NOTE — Patient Instructions (Addendum)
ICD-10-CM   1. Multiple lung nodules on CT  R91.8   2. 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   - right side nodules on CT March 2022 is worse compared to  Sept 2021 raising concern MAI infection is back  Plan  - bronchoscopy with BAL at [facility] long as outpatient - in April or may 2022 - stay tuned - take print out of ct chest report  Followup  -  end of may 2022 or early June 2022 to discuss results

## 2020-07-27 NOTE — Telephone Encounter (Signed)
Patient called back  All information given to patient.  Patient verbalized instructions back  Nothing further needed at this time.

## 2020-07-30 ENCOUNTER — Telehealth: Payer: Self-pay | Admitting: Internal Medicine

## 2020-08-02 NOTE — Telephone Encounter (Signed)
Nothing noted in message. Will close encounter.  

## 2020-08-15 ENCOUNTER — Telehealth: Payer: Self-pay | Admitting: Internal Medicine

## 2020-08-15 DIAGNOSIS — R0781 Pleurodynia: Secondary | ICD-10-CM

## 2020-08-15 NOTE — Telephone Encounter (Signed)
ATC, NA and no VM picked up Will call back

## 2020-08-16 ENCOUNTER — Other Ambulatory Visit (INDEPENDENT_AMBULATORY_CARE_PROVIDER_SITE_OTHER): Payer: Medicare Other

## 2020-08-16 DIAGNOSIS — Z01812 Encounter for preprocedural laboratory examination: Secondary | ICD-10-CM | POA: Diagnosis not present

## 2020-08-16 DIAGNOSIS — Z5181 Encounter for therapeutic drug level monitoring: Secondary | ICD-10-CM | POA: Diagnosis not present

## 2020-08-16 LAB — CBC WITH DIFFERENTIAL/PLATELET
Basophils Absolute: 0.1 10*3/uL (ref 0.0–0.1)
Basophils Relative: 0.7 % (ref 0.0–3.0)
Eosinophils Absolute: 0 10*3/uL (ref 0.0–0.7)
Eosinophils Relative: 0.6 % (ref 0.0–5.0)
HCT: 38.1 % (ref 36.0–46.0)
Hemoglobin: 12.8 g/dL (ref 12.0–15.0)
Lymphocytes Relative: 25.9 % (ref 12.0–46.0)
Lymphs Abs: 2 10*3/uL (ref 0.7–4.0)
MCHC: 33.6 g/dL (ref 30.0–36.0)
MCV: 98.6 fl (ref 78.0–100.0)
Monocytes Absolute: 0.5 10*3/uL (ref 0.1–1.0)
Monocytes Relative: 6.2 % (ref 3.0–12.0)
Neutro Abs: 5.1 10*3/uL (ref 1.4–7.7)
Neutrophils Relative %: 66.6 % (ref 43.0–77.0)
Platelets: 199 10*3/uL (ref 150.0–400.0)
RBC: 3.87 Mil/uL (ref 3.87–5.11)
RDW: 14.1 % (ref 11.5–15.5)
WBC: 7.7 10*3/uL (ref 4.0–10.5)

## 2020-08-16 LAB — COMPREHENSIVE METABOLIC PANEL
ALT: 19 U/L (ref 0–35)
AST: 36 U/L (ref 0–37)
Albumin: 3.8 g/dL (ref 3.5–5.2)
Alkaline Phosphatase: 68 U/L (ref 39–117)
BUN: 11 mg/dL (ref 6–23)
CO2: 28 mEq/L (ref 19–32)
Calcium: 9.2 mg/dL (ref 8.4–10.5)
Chloride: 98 mEq/L (ref 96–112)
Creatinine, Ser: 0.72 mg/dL (ref 0.40–1.20)
GFR: 84.55 mL/min (ref >60.00)
Glucose, Bld: 79 mg/dL (ref 70–99)
Potassium: 4 mEq/L (ref 3.5–5.1)
Sodium: 136 mEq/L (ref 135–145)
Total Bilirubin: 0.5 mg/dL (ref 0.2–1.2)
Total Protein: 6.4 g/dL (ref 6.0–8.3)

## 2020-08-16 LAB — PROTIME-INR
INR: 0.9 ratio (ref 0.8–1.0)
Prothrombin Time: 10.4 s (ref 9.6–13.1)

## 2020-08-16 LAB — APTT: aPTT: 27 s (ref 23.4–32.7)

## 2020-08-16 NOTE — Telephone Encounter (Signed)
Patient was diagnosed with Covid 19 February 1st or 2nd. Patient is returning phone call. Patient phone number is [phone].

## 2020-08-16 NOTE — Telephone Encounter (Signed)
Called patient but she did not answer. No VM available.

## 2020-08-16 NOTE — Telephone Encounter (Signed)
Pt came in today 4/21 to get labwork done which are labs specified for the bronch.  Attempted to call pt but unable to reach. Left message for her to return call.  What we need to know is when exactly it was in February that she tested positive for Covid. Pt is scheduled for the bronch 5/4.

## 2020-08-16 NOTE — Telephone Encounter (Signed)
ATC pt. Line rang several times with no VM. Will try to reach pt once more.

## 2020-08-16 NOTE — Telephone Encounter (Signed)
ATC x1.  Left VM to return call.

## 2020-08-17 NOTE — Telephone Encounter (Signed)
The odds of covid test being positive in may is very low esp if positive 3 months earlier. Like < 1% c hance for an outpatint case of covid being positive 3 months out,. She should go ahead and get tested.

## 2020-08-17 NOTE — Telephone Encounter (Signed)
Patient is returning phone call. Patient phone number is [phone].

## 2020-08-17 NOTE — Telephone Encounter (Signed)
I called and spoke with patient regarding MR recs. She verbalized understanding and nothing further needed.

## 2020-08-17 NOTE — Telephone Encounter (Signed)
Called and spoke to patient, who stated that she tested positive for covid on 05/29/20. She is scheduled for covid test on 08/27/2020 in preparation of bronch. 90th day since covid is 08/26/2020. She is concerned that her covid test may still be positive.  Patient would like a call back on her mobile phone with response ASAP.  MR, please advise. thanks

## 2020-08-22 DIAGNOSIS — E785 Hyperlipidemia, unspecified: Secondary | ICD-10-CM | POA: Diagnosis not present

## 2020-08-22 DIAGNOSIS — Z681 Body mass index (BMI) 19 or less, adult: Secondary | ICD-10-CM | POA: Diagnosis not present

## 2020-08-22 DIAGNOSIS — F419 Anxiety disorder, unspecified: Secondary | ICD-10-CM | POA: Diagnosis not present

## 2020-08-22 DIAGNOSIS — A310 Pulmonary mycobacterial infection: Secondary | ICD-10-CM | POA: Diagnosis not present

## 2020-08-27 ENCOUNTER — Other Ambulatory Visit (HOSPITAL_COMMUNITY)
Admission: RE | Admit: 2020-08-27 | Discharge: 2020-08-27 | Disposition: A | Discharge status: Home / Self Care | Payer: Medicare Other | Source: Ambulatory Visit | Attending: Internal Medicine | Admitting: Internal Medicine

## 2020-08-27 ENCOUNTER — Other Ambulatory Visit: Payer: Self-pay

## 2020-08-27 DIAGNOSIS — Z01812 Encounter for preprocedural laboratory examination: Secondary | ICD-10-CM | POA: Diagnosis not present

## 2020-08-27 DIAGNOSIS — Z20822 Contact with and (suspected) exposure to covid-19: Secondary | ICD-10-CM | POA: Diagnosis not present

## 2020-08-28 LAB — SARS CORONAVIRUS 2 (TAT 6-24 HRS): SARS Coronavirus 2: NEGATIVE

## 2020-08-29 ENCOUNTER — Other Ambulatory Visit: Payer: Self-pay

## 2020-08-29 ENCOUNTER — Encounter (HOSPITAL_COMMUNITY): Payer: Self-pay | Admitting: Internal Medicine

## 2020-08-29 ENCOUNTER — Encounter (HOSPITAL_COMMUNITY)
Admission: RE | Disposition: A | Discharge status: Home / Self Care | Payer: Self-pay | Source: Ambulatory Visit | Attending: Internal Medicine

## 2020-08-29 ENCOUNTER — Ambulatory Visit (HOSPITAL_COMMUNITY)
Admission: RE | Admit: 2020-08-29 | Discharge: 2020-08-29 | Disposition: A | Discharge status: Home / Self Care | Payer: Medicare Other | Source: Ambulatory Visit | Attending: Internal Medicine | Admitting: Internal Medicine

## 2020-08-29 DIAGNOSIS — R053 Chronic cough: Secondary | ICD-10-CM | POA: Diagnosis not present

## 2020-08-29 DIAGNOSIS — Z91030 Bee allergy status: Secondary | ICD-10-CM | POA: Insufficient documentation

## 2020-08-29 DIAGNOSIS — J439 Emphysema, unspecified: Secondary | ICD-10-CM | POA: Diagnosis not present

## 2020-08-29 DIAGNOSIS — I2510 Atherosclerotic heart disease of native coronary artery without angina pectoris: Secondary | ICD-10-CM | POA: Diagnosis not present

## 2020-08-29 DIAGNOSIS — Z888 Allergy status to other drugs, medicaments and biological substances status: Secondary | ICD-10-CM | POA: Insufficient documentation

## 2020-08-29 DIAGNOSIS — R918 Other nonspecific abnormal finding of lung field: Secondary | ICD-10-CM

## 2020-08-29 DIAGNOSIS — I700 Atherosclerosis of aorta: Secondary | ICD-10-CM | POA: Insufficient documentation

## 2020-08-29 DIAGNOSIS — Z87892 Personal history of anaphylaxis: Secondary | ICD-10-CM | POA: Diagnosis not present

## 2020-08-29 DIAGNOSIS — Z87891 Personal history of nicotine dependence: Secondary | ICD-10-CM | POA: Diagnosis not present

## 2020-08-29 DIAGNOSIS — Z881 Allergy status to other antibiotic agents status: Secondary | ICD-10-CM | POA: Insufficient documentation

## 2020-08-29 DIAGNOSIS — Z8619 Personal history of other infectious and parasitic diseases: Secondary | ICD-10-CM | POA: Insufficient documentation

## 2020-08-29 DIAGNOSIS — J342 Deviated nasal septum: Secondary | ICD-10-CM | POA: Diagnosis not present

## 2020-08-29 DIAGNOSIS — J324 Chronic pansinusitis: Secondary | ICD-10-CM | POA: Diagnosis not present

## 2020-08-29 DIAGNOSIS — R0989 Other specified symptoms and signs involving the circulatory and respiratory systems: Secondary | ICD-10-CM | POA: Diagnosis not present

## 2020-08-29 HISTORY — PX: BRONCHIAL WASHINGS: SHX5105

## 2020-08-29 HISTORY — PX: VIDEO BRONCHOSCOPY: SHX5072

## 2020-08-29 LAB — BODY FLUID CELL COUNT WITH DIFFERENTIAL
Lymphs, Fluid: 28 %
Monocyte-Macrophage-Serous Fluid: 60 % (ref 50–90)
Neutrophil Count, Fluid: 12 % (ref 0–25)
Total Nucleated Cell Count, Fluid: 46 cu mm (ref 0–1000)

## 2020-08-29 SURGERY — VIDEO BRONCHOSCOPY WITHOUT FLUORO
Anesthesia: Moderate Sedation

## 2020-08-29 MED ORDER — MIDAZOLAM HCL (PF) 5 MG/ML IJ SOLN
INTRAMUSCULAR | Status: DC | PRN
Start: 2020-08-29 — End: 2020-08-29
  Administered 2020-08-29: 1 mg via INTRAVENOUS
  Administered 2020-08-29: 2 mg via INTRAVENOUS
  Administered 2020-08-29 (×2): 1 mg via INTRAVENOUS

## 2020-08-29 MED ORDER — LIDOCAINE HCL URETHRAL/MUCOSAL 2 % EX GEL
CUTANEOUS | Status: DC | PRN
  Administered 2020-08-29: 1

## 2020-08-29 MED ORDER — BUTAMBEN-TETRACAINE-BENZOCAINE 2-2-14 % EX AERO: 1.0000 | INHALATION_SPRAY | Freq: Once | CUTANEOUS | Status: DC

## 2020-08-29 MED ORDER — LIDOCAINE HCL URETHRAL/MUCOSAL 2 % EX GEL: 1.0000 "application " | Freq: Once | CUTANEOUS | Status: DC

## 2020-08-29 MED ORDER — FENTANYL CITRATE (PF) 100 MCG/2ML IJ SOLN
INTRAMUSCULAR | Status: AC
  Filled 2020-08-29: qty 2

## 2020-08-29 MED ORDER — LIDOCAINE HCL 1 % IJ SOLN
INTRAMUSCULAR | Status: AC
  Filled 2020-08-29: qty 20

## 2020-08-29 MED ORDER — FENTANYL CITRATE (PF) 100 MCG/2ML IJ SOLN
INTRAMUSCULAR | Status: DC | PRN
  Administered 2020-08-29 (×2): 50 ug via INTRAVENOUS
  Administered 2020-08-29 (×2): 25 ug via INTRAVENOUS

## 2020-08-29 MED ORDER — BUTAMBEN-TETRACAINE-BENZOCAINE 2-2-14 % EX AERO
INHALATION_SPRAY | CUTANEOUS | Status: DC | PRN
  Administered 2020-08-29: 2 via TOPICAL

## 2020-08-29 MED ORDER — LIDOCAINE HCL (PF) 4 % IJ SOLN
INTRAMUSCULAR | Status: AC
  Filled 2020-08-29: qty 5

## 2020-08-29 MED ORDER — MIDAZOLAM HCL (PF) 5 MG/ML IJ SOLN
INTRAMUSCULAR | Status: AC
  Filled 2020-08-29: qty 3

## 2020-08-29 MED ORDER — LIDOCAINE HCL 4 % EX SOLN
CUTANEOUS | Status: DC | PRN
  Administered 2020-08-29: 4 mL via TOPICAL

## 2020-08-29 MED ORDER — LIDOCAINE HCL URETHRAL/MUCOSAL 2 % EX GEL
CUTANEOUS | Status: AC
  Filled 2020-08-29: qty 30

## 2020-08-29 MED ORDER — DIPHENHYDRAMINE HCL 50 MG/ML IJ SOLN
INTRAMUSCULAR | Status: AC
  Filled 2020-08-29: qty 1

## 2020-08-29 MED ORDER — PHENYLEPHRINE HCL 0.25 % NA SOLN
NASAL | Status: AC
  Filled 2020-08-29: qty 15

## 2020-08-29 MED ORDER — PHENYLEPHRINE HCL 0.25 % NA SOLN
1.0000 | Freq: Four times a day (QID) | NASAL | Status: DC | PRN
  Administered 2020-08-29: 1 via NASAL

## 2020-08-29 MED ORDER — LIDOCAINE HCL 1 % IJ SOLN
INTRAMUSCULAR | Status: DC | PRN
  Administered 2020-08-29: 2 mL
  Administered 2020-08-29: 1 mL
  Administered 2020-08-29: 2 mL

## 2020-08-29 MED ORDER — LACTATED RINGERS IV SOLN: INTRAVENOUS | Status: DC

## 2020-08-29 NOTE — H&P (Signed)
OV 08/29/2020  Subjective:  Patient ID: Erin Black, female , DOB: 1950-10-28 , age 70 y.o. , MRN: [id number] , ADDRESS: [address] [place] [address] PCP Ernestene Kiel, MD Patient Care Team: Ernestene Kiel, MD as PCP - General (Internal Medicine) Elsie Stain, MD as Consulting Physician (Pulmonary Disease) Richardo Priest, MD as Referring Physician (Cardiology)  This Provider for this visit: Treatment Team:  Attending Provider: Brand Males, MD    08/29/2020 -  Cough and pulmonary infiltrates   HPI Erin Black 70 y.o. -  Last seen > 30d ago. So new H&P. No new complaints. Ongoing cough. NPO confirmed. Has hx of MAI    CT Chest data 07/09/20  identified.  IMPRESSION: 1. Multiple scattered bilateral areas of clustered and tree-in-bud centrilobular nodularity are again seen. Several areas of nodularity are new or worsened compared to prior examination, for example in the medial left upper lobe and in both the medial and lateral segments of the right middle lobe. 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 2. Emphysema. 3. Coronary artery disease.  Aortic Atherosclerosis (ICD10-I70.0) and Emphysema (ICD10-J43.9).   Electronically Signed   By: Eddie Candle M.D.   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), MAI (mycobacterium avium-intracellulare) ([place]) (02/19/2015), MAI (mycobacterium avium-intracellulare) infection: RML RLL nodular  infiltrates ; Pos FOB cultures (04/11/2014), NSTEMI (non-ST elevated myocardial infarction) [facility]) (Nov 2015), Osteopenia, Other acute recurrent sinusitis (05/13/2016), Post-nasal drip (06/20/2015), Pulmonary emphysema ([place]) (05/13/2016), PVC's (premature ventricular contractions), RA (rheumatoid arthritis) ([place]), Rheumatoid arthritis involving multiple sites [facility]), Takotsubo cardiomyopathy, and UTI (lower urinary tract infection).   reports that she quit smoking about 22 years ago. Her smoking use included cigarettes. She has a 25.00 pack-year smoking history. She has never used smokeless tobacco.  Past Surgical History:  Procedure Laterality Date  . bilateral tubal ligation    . CARDIAC CATHETERIZATION    . LUMBAR SPINE SURGERY    . strabismus repair    . TONSILLECTOMY AND ADENOIDECTOMY    . VIDEO BRONCHOSCOPY Bilateral 05/17/2014   Procedure: VIDEO BRONCHOSCOPY WITHOUT FLUORO;  Surgeon: Elsie Stain, MD;  [facility];  Service: Cardiopulmonary;  Laterality: Bilateral;    Allergies  Allergen Reactions  . Yellow Jacket Venom [Bee Venom] Anaphylaxis    Hives/swelling  . Omnicef [Cefdinir]     rash  . Plavix [Clopidogrel Bisulfate]     rash    Immunization History  Administered Date(s) Administered  . Fluad Quad(high Dose 65+) 12/28/2018, 01/24/2020  . Influenza, High Dose Seasonal PF 01/22/2016, 01/12/2018  . Influenza-Unspecified 01/26/2014, 01/16/2015, 02/09/2017  . Janssen (J&J) SARS-COV-2 Vaccination 08/06/2019  . Pneumococcal Conjugate-13 02/19/2015    Family History  Problem Relation Age of Onset  . Emphysema Mother   . Heart disease Mother   . Lung cancer Mother   . Prostate cancer Father   . Lung cancer Sister      Current Facility-Administered Medications:  .  butamben-tetracaine-benzocaine (CETACAINE) spray 1 spray, 1 spray, Topical, Once, Brand Males, MD .  lactated ringers infusion, , Intravenous, Continuous, Zoella Roberti, MD, Last Rate:  10 mL/hr at 08/29/20 1235, New Bag at 08/29/20  1235 .  lidocaine (XYLOCAINE) 2 % jelly 1 application, 1 application, Topical, Once, Mandela Bello, MD .  phenylephrine (NEO-SYNEPHRINE) 0.25 % nasal spray 1 spray, 1 spray, Each Nare, Q6H PRN, Brand Males, MD, 1 spray at 08/29/20 1229      Objective:   Vitals:   08/27/20 1400 08/29/20 1229  BP:  (!) 126/56  Pulse:  71  Resp:  16  Temp:  97.8 F (36.6 C)  TempSrc:  Oral  SpO2:  99%  Weight: 38.6 kg 38.6 kg  Height: _0  (1.626 m) _1  (1.626 m)    Estimated body mass index is 14.61 kg/m as calculated from the following:   Height as of this encounter: _2  (1.626 m).   Weight as of this encounter: 38.6 kg.  Weight change:   Filed Weights   08/27/20 1400 08/29/20 1229  Weight: 38.6 kg 38.6 kg     Physical Exam General: No distress. Looks well Neuro: Alert and Oriented x 3. GCS 15. Speech normal Psych: Pleasant Resp:  Barrel Chest - no.  Wheeze - no, Crackles - no, No overt respiratory distress CVS: Normal heart sounds. Murmurs - no Ext: Stigmata of Connective Tissue Disease - no HEENT: Normal upper airway. PEERL +. No post nasal drip        Assessment:     Cough Pulmonary Infiltrates Hx of MAI    Plan:     Risks of pneumothorax, hemothorax, sedation/anesthesia complications such as cardiac or respiratory arrest or hypotension, stroke and bleeding all explained. Benefits of diagnosis but limitations of non-diagnosis also explained. Patient verbalized understanding and wished to proceed.    BAL    SIGNATURE    Dr. Brand Males, M.D., F.C.C.P,  Pulmonary and Critical Care Medicine Staff Physician, [place] Director - Interstitial Lung Disease  Program  Pulmonary [place] at [place], [place], [address]  Pager: [phone], If no answer or between  15:00h - 7:00h: call [phone] Telephone: [phone]  12:50  PM 08/29/2020

## 2020-08-29 NOTE — Op Note (Signed)
Name:  Erin Black MRN:  [id number] DOB:  [birth year]  PROCEDURE NOTE  Procedure(s): Flexible bronchoscopy [phone] Bronchial alveolar lavage [phone] of the Right Middle Lobe   Indications:  Chronic cough, Pulmonary infiltrates. History of MAI  Consent:  Procedure, benefits, risks and alternatives discussed.  Questions answered.  Consent obtained.  Anesthesia:  Moderate Sedation  Location: [place]   Procedure summary:  Appropriate equipment was assembled.  The patient was brought to the procedure suite room and identified as Erin Black with 1951/02/16  Safety timeout was performed. The patient was placed supine on the  table, airway and moderate sedation administered by this operator  After the appropriate level of moderation was assured, flexible video bronchoscope was lubricated and inserted through the right naires  1% Lidocaine were administered through the bronchoscope to augment moderate sedation  Airway examination was yes performed bilaterally to subsegmental level.  Minimal clear secretions were noted, mucosa appeared normal and no endobronchial lesions were identified. The right middle lobe orifice was somewha narrowed  Bronchial alveolar lavage of the right middle was performed with 76mmL of normal saline  x1 number of times was aspirated and returned to cannister. And then 40cc x 2 of saline to Right middle lobe with. Total return of 30 mL of fluid, after which the bronchoscope was withdrawn. fLuid was cloudy grey     After hemostasis was assure, the bronchoscope was withdrawn.  The patient was recovered and then  transferred to recovery area  Post-procedure chest x-ray was no ordered.  Specimens sent: Bronchial alveolar lavage specimen of the right middle mobe for cell count, microbiology and cytology.  Complications:  No immediate complications were noted.  Hemodynamic parameters and oxygenation remained stable throughout the procedure.  Estimated  blood loss:  none  IMPRESSION 1. Normal airway 2. Right Middle Lobe BAL 3. EClabe Sealclassifer sent - YES NO  Followup Future Appointments  Date Time Provider Department Center  09/17/2020 10:00 AM RBrand Males MD [facility] None     Dr. MBrand Males M.D., FSt. Elizabeth EdgewoodC.P Pulmonary and Critical Care Medicine Staff Physician [facility] Pager: [phone] If no answer or between  15:00h - 7:00h: call [phone]  08/29/2020 1:58 PM

## 2020-08-29 NOTE — Discharge Instructions (Signed)
BRONCHOSCOPY DISCHARGE INSTRUCTIONS  - Please have someone to drive you home - Please be careful with activities for next 24 hours - You can eat 2-4 hours after getting home provided you are fully alert, able to cough, and are not nauseated or vomiting and     feel well - You are expected to have low grade fever or cough some amount of blood for next 24-48 hours; if this worsens call us - If you are very short of breath or coughing blood or chest pain or not feeling well, call us [phone] anytime or go to emergency room - For followup appointment - see below. If not there please call [phone] to make an appointment with Dr Marchelle Gearing or nurse practitioner   Future Appointments  Date Time Provider Department Center  09/17/2020 10:00 AM Kalman Shan, MD [facility] None

## 2020-08-30 ENCOUNTER — Encounter: Payer: Self-pay | Admitting: Primary Care

## 2020-08-30 ENCOUNTER — Ambulatory Visit (INDEPENDENT_AMBULATORY_CARE_PROVIDER_SITE_OTHER): Payer: Medicare Other | Admitting: Primary Care

## 2020-08-30 ENCOUNTER — Telehealth: Payer: Self-pay | Admitting: Internal Medicine

## 2020-08-30 ENCOUNTER — Ambulatory Visit (INDEPENDENT_AMBULATORY_CARE_PROVIDER_SITE_OTHER): Payer: Medicare Other

## 2020-08-30 ENCOUNTER — Ambulatory Visit: Payer: Medicare Other

## 2020-08-30 VITALS — BP 138/64 | HR 105 | Temp 99.0°F | Ht 64.0 in | Wt 88.6 lb

## 2020-08-30 DIAGNOSIS — M4184 Other forms of scoliosis, thoracic region: Secondary | ICD-10-CM | POA: Diagnosis not present

## 2020-08-30 DIAGNOSIS — J189 Pneumonia, unspecified organism: Secondary | ICD-10-CM

## 2020-08-30 DIAGNOSIS — M47814 Spondylosis without myelopathy or radiculopathy, thoracic region: Secondary | ICD-10-CM | POA: Diagnosis not present

## 2020-08-30 DIAGNOSIS — R918 Other nonspecific abnormal finding of lung field: Secondary | ICD-10-CM | POA: Diagnosis not present

## 2020-08-30 DIAGNOSIS — R0602 Shortness of breath: Secondary | ICD-10-CM

## 2020-08-30 DIAGNOSIS — R0781 Pleurodynia: Secondary | ICD-10-CM | POA: Diagnosis not present

## 2020-08-30 HISTORY — DX: Pneumonia, unspecified organism: J18.9

## 2020-08-30 MED ORDER — LEVOFLOXACIN 500 MG PO TABS: 500.0000 mg | ORAL_TABLET | Freq: Every day | ORAL | 0 refills | Status: DC

## 2020-08-30 NOTE — Progress Notes (Signed)
@Patient  ID: , female    DOB: Oct 31, 1950, 70 y.o.   MRN: [id number]  Chief Complaint  Patient presents with  . Acute Visit    S/p Bronchoscopy yesterday-right sided pain with deep breath when waking up today, increased sob, cough non- productive, temp. At home 102.4, now 52    Referring provider: 82, MD  HPI: 70 year old female, former smoker quit in January 2000.  Past medical history significant for bronchiectasis, pulmonary emphysema, infiltrate, hypertension, cardiomyopathy, congestive heart failure, NSTEMI, Takotsubo Cardiomyopathy, rheumatoid arthritis.  Patient of Dr. February 2000.   08/30/2020 Patient presents today for an acute office visit with reports of right sided chest pain and shortness of breath following bronchoscopy yesterday. Associated fever 100-102. She is unable to cough up any mucus d/t pain. She was previously asymptomatic prior to procedure. She is eating and drinking normally. She has some mild nausea. CXR today showed RML infiltrate. Denies hemoptysis.    Allergies  Allergen Reactions  . Yellow Jacket Venom [Bee Venom] Anaphylaxis    Hives/swelling  . Omnicef [Cefdinir]     rash  . Plavix [Clopidogrel Bisulfate]     rash    Immunization History  Administered Date(s) Administered  . Fluad Quad(high Dose 65+) 12/28/2018, 01/24/2020  . Influenza, High Dose Seasonal PF 01/22/2016, 01/12/2018  . Influenza-Unspecified 01/26/2014, 01/16/2015, 02/09/2017  . Janssen (J&J) SARS-COV-2 Vaccination 08/06/2019  . Pneumococcal Conjugate-13 02/19/2015    Past Medical History:  Diagnosis Date  . Anxiety disorder   . 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 Bronchiectasis without complication (HCC)   . Chronic cough 06/20/2015  . Congestive heart failure (HCC)   . DJD (degenerative joint disease)   . Elevated  IgE level 08/11/2016  . Episode of syncope 10/31/2014  . Essential hypertension   . Hearing loss   . History of MAI infection 02/21/2016  . Hx of non-ST elevation myocardial infarction (NSTEMI) 04/11/2014  . 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 NSTEMI (non-ST elevated myocardial infarction) [facility]) Nov 2015  . Osteopenia   . Other acute recurrent sinusitis 05/13/2016  . Post-nasal drip 06/20/2015  . Pulmonary emphysema (HCC) 05/13/2016  . PVC's (premature ventricular contractions)   . RA (rheumatoid arthritis) (HCC)   . Rheumatoid arthritis involving multiple sites (HCC)   . Takotsubo cardiomyopathy   . UTI (lower urinary tract infection)     Tobacco History: Social History   Tobacco Use  Smoking Status Former Smoker  . Packs/day: 1.00  . Years: 25.00  . Pack years: 25.00  . Types: Cigarettes  . Quit date: 04/28/1998  . Years since quitting: 22.3  Smokeless Tobacco Never Used   Counseling given: Not Answered   Outpatient Medications Prior to Visit  Medication Sig Dispense Refill  . aspirin 81 MG tablet Take 81 mg by mouth at bedtime. Every other day per pcp    . atorvastatin (LIPITOR) 80 MG tablet Take 40 mg by mouth at bedtime.    06/27/1998  clonazePAM (KLONOPIN) 0.5 MG tablet Take 0.5 mg by mouth daily.    Marland Kitchen EPINEPHrine 0.3 mg/0.3 mL IJ SOAJ injection Inject 0.3 mg into the muscle as needed.     Marland Kitchen ipratropium (ATROVENT) 0.06 % nasal spray Place 2 sprays into both nostrils in the morning and at bedtime.  5  .  loratadine (CLARITIN) 10 MG tablet Take 10 mg by mouth daily.    . metoprolol tartrate (LOPRESSOR) 25 MG tablet Take 1 tablet (25 mg total) by mouth at bedtime. (BETA BLOCKER) (Patient taking differently: Take 12.5 mg by mouth at bedtime. Taking 1/2 tablet  (BETA BLOCKER)) 90 tablet 3  . tiZANidine (ZANAFLEX) 4 MG tablet Take 4 mg by mouth at bedtime.    . nitroGLYCERIN (NITROSTAT) 0.4 MG SL tablet Place 0.4 mg under the tongue every 5 (five) minutes as needed for chest pain. (Patient not taking: Reported on 08/30/2020)     No facility-administered medications prior to visit.    Review of Systems  Review of Systems  Constitutional: Negative.   HENT: Negative.   Respiratory: Positive for cough and shortness of breath.        Right sided pleuritic pain   Cardiovascular: Negative.     Physical Exam  BP 138/64 (BP Location: Left Arm, Cuff Size: Normal)   Pulse (!) 105   Temp 99 F (37.2 C) (Temporal)   Ht 5\' 4"  (1.626 m)   Wt 88 lb 9.6 oz (40.2 kg)   SpO2 95%   BMI 15.21 kg/m  Physical Exam Constitutional:      Appearance: Normal appearance.     Comments: Thin adult female  HENT:     Head: Normocephalic and atraumatic.     Mouth/Throat:     Comments: Deferred d/t masking Cardiovascular:     Rate and Rhythm: Normal rate and regular rhythm.  Pulmonary:     Effort: Pulmonary effort is normal.     Breath sounds: Normal breath sounds. No wheezing or rhonchi.     Comments: CTA, slightly diminished right middle lobe Musculoskeletal:        General: Normal range of motion.  Skin:    General: Skin is warm and dry.  Neurological:     General: No focal deficit present.     Mental Status: She is alert and oriented to person, place, and time. Mental status is at baseline.  Psychiatric:        Mood and Affect: Mood normal.        Thought Content: Thought content normal.      Lab Results:  CBC    Component Value Date/Time   WBC 7.7 08/16/2020 1131   RBC 3.87 08/16/2020 1131    HGB 12.8 08/16/2020 1131   HCT 38.1 08/16/2020 1131   PLT 199.0 08/16/2020 1131   MCV 98.6 08/16/2020 1131   MCHC 33.6 08/16/2020 1131   RDW 14.1 08/16/2020 1131   LYMPHSABS 2.0 08/16/2020 1131   MONOABS 0.5 08/16/2020 1131   EOSABS 0.0 08/16/2020 1131   BASOSABS 0.1 08/16/2020 1131    BMET    Component Value Date/Time   NA 136 08/16/2020 1131   K 4.0 08/16/2020 1131   CL 98 08/16/2020 1131   CO2 28 08/16/2020 1131   GLUCOSE 79 08/16/2020 1131   BUN 11 08/16/2020 1131   CREATININE 0.72 08/16/2020 1131   CALCIUM 9.2 08/16/2020 1131    BNP No results found for: BNP  ProBNP No results found for: PROBNP  Imaging: DG Chest  2 View  Result Date: 08/30/2020 CLINICAL DATA:  Pleuritic pain. EXAM: CHEST - 2 VIEW COMPARISON:  CT 07/09/2020.  Chest x-ray 04/12/2014. FINDINGS: Mediastinum and hilar structures normal. Heart size normal. Right middle lobe infiltrate consistent with pneumonia. No prominent pleural effusion. No pneumothorax. Heart size normal. Degenerative changes and scoliosis thoracic spine. IMPRESSION: Right middle lobe infiltrate consistent with pneumonia. Electronically Signed   By: Maisie Fus  Register   On: 08/30/2020 11:33     Assessment & Plan:   RML pneumonia - Patient presents with SOB, right sided pleuritic pain and fever x1 day s/p bronchoscopy 08/29/20. No hemoptysis. CXR today showed infiltrate right middle lobe. Dr. Marchelle Gearing made aware. She appears clinically stable. Sending in Rx for Levaquin 500mg  x 7 days. Encourage patient push oral fluids and take tylenol 650mg  every 4-6 hours for fever/pain . Recommend home covid test or PCR at pharmacy if not better. Follow-up televisit in 5-7 days with [facility] NP    , NP 08/30/2020

## 2020-08-30 NOTE — Telephone Encounter (Signed)
Dr. Marchelle Gearing is unavailable, will send to provider on call.    Dr. Tonia Brooms, please advise. Thanks.

## 2020-08-30 NOTE — Telephone Encounter (Signed)
Called and spoke to pt. Pt states she had a bronch yesterday and this morning she is c/o 8/10 pain under right breast, non prod cough with some congestion, 100.4 temp with chills, shallow respirations as it hurts to take deeper breaths, nausea. Pt states her spo2 is ranging from 94-99% on RA. Pt denies wheezing.    Dr. Marchelle Gearing, please advise. Thanks.

## 2020-08-30 NOTE — Addendum Note (Signed)
Addended by: Charlott Holler on: 08/30/2020 10:56 AM   Modules accepted: Orders

## 2020-08-30 NOTE — Telephone Encounter (Signed)
Called and spoke with patient to let her know that Dr. Tonia Brooms would like for her to be seen in office today and for her to have CXR done. Patient stated she could come in. She is now scheduled with Beth for 11:30. Her nurse has been made aware. Will forward to [facility] as FYI and CXR order has been placed. Nothing further needed at this time.

## 2020-08-30 NOTE — Telephone Encounter (Signed)
Patient needs to be seen today in the office with a CXR.   Josephine Igo, DO [facility] 08/30/2020 10:12 AM

## 2020-08-30 NOTE — Telephone Encounter (Signed)
Beth  Thanks. She has hx of MAI and now with cough and infiltrates worse. I did RML BAL. No Bx yesterday. Agree CXR today helpful  Thanks  MR

## 2020-08-30 NOTE — Telephone Encounter (Signed)
Needs to be STAT, I will have my nurse change

## 2020-08-30 NOTE — Assessment & Plan Note (Addendum)
-   Patient presents with SOB, right sided pleuritic pain and fever x1 day s/p bronchoscopy 08/29/20. No hemoptysis. CXR today showed infiltrate right middle lobe. Dr. Marchelle Gearing made aware. She appears clinically stable. Sending in Rx for Levaquin 500mg  x 7 days. Encourage patient push oral fluids and take tylenol 650mg  every 4-6 hours for fever/pain . Recommend home covid test or PCR at pharmacy if not better. Follow-up televisit in 5-7 days with [facility] NP

## 2020-08-30 NOTE — Patient Instructions (Addendum)
CXR showed infiltrate right middle lobe. That with your symptoms recommend treating for suspected pneumonia  Sending in Rx for Levaquin 500mg  x 7 days Push oral fluids, aim 8-10 eight oz glasses of fluid  Take tylenol 650mg  every 4-6 hours for ever/pain  If not getting better in 2-3 days would get either home covid test or PCR at pharmacy - if positive notify office   Follow-up televisit in 5-7 days with [facility] NP

## 2020-08-31 LAB — MTB RIF NAA W/O CULTURE, SPUTUM

## 2020-08-31 LAB — ACID FAST SMEAR (AFB, MYCOBACTERIA): Acid Fast Smear: NEGATIVE

## 2020-08-31 LAB — PNEUMOCYSTIS JIROVECI SMEAR BY DFA: Pneumocystis jiroveci Ag: NEGATIVE

## 2020-08-31 LAB — CYTOLOGY - NON PAP

## 2020-09-01 LAB — CULTURE, RESPIRATORY W GRAM STAIN: Culture: NORMAL

## 2020-09-03 LAB — ANAEROBIC CULTURE W GRAM STAIN

## 2020-09-06 ENCOUNTER — Encounter: Payer: Self-pay | Admitting: Primary Care

## 2020-09-06 ENCOUNTER — Ambulatory Visit (INDEPENDENT_AMBULATORY_CARE_PROVIDER_SITE_OTHER): Payer: Medicare Other | Admitting: Primary Care

## 2020-09-06 ENCOUNTER — Other Ambulatory Visit: Payer: Self-pay

## 2020-09-06 DIAGNOSIS — J189 Pneumonia, unspecified organism: Secondary | ICD-10-CM | POA: Diagnosis not present

## 2020-09-06 NOTE — Patient Instructions (Signed)
Recommend Mucinex 600-1200mg  during the day You can take Delsym cough syrup 5-56ml at bedtime  Needs repeat CXR prior to office visit on 09/17/20 with Dr. Marchelle Gearing

## 2020-09-06 NOTE — Progress Notes (Signed)
Virtual Visit via Telephone Note  I connected with Erin Black on 09/06/20 at 10:00 AM EDT by telephone and verified that I am speaking with the correct person using two identifiers.  Location: Patient: Home Provider: Office    I discussed the limitations, risks, security and privacy concerns of performing an evaluation and management service by telephone and the availability of in person appointments. I also discussed with the patient that there may be a patient responsible charge related to this service. The patient expressed understanding and agreed to proceed.   History of Present Illness: 70 year old female, former smoker quit in January 2000.  Past medical history significant for bronchiectasis, pulmonary emphysema, infiltrate, hypertension, cardiomyopathy, congestive heart failure, NSTEMI, Takotsubo Cardiomyopathy, rheumatoid arthritis.  Patient of Dr. Chase Caller.   Previous [facility] pulmonary encounter: 08/30/2020 Patient presents today for an acute office visit with reports of right sided chest pain and shortness of breath following bronchoscopy yesterday. Associated fever 100-102. She is unable to cough up any mucus d/t pain. She was previously asymptomatic prior to procedure. She is eating and drinking normally. She has some mild nausea. CXR today showed RML infiltrate. Denies hemoptysis.   09/06/2020- Interim hx  Patient contacted today for 1 week follow-up pneumonia. During last visit CXR showed right middle lobe infiltrate. We treated her with Levaquin 576m x 7 days. She finished Levaquin yesterday. She is improving. She has been afebrile for 48-72 hours. She is still coughing with some productive. No longer having rib pain. She was coughing prior to bronchoscopy. Bronchoalveolar lavage cultures have shown no growth; AFB and Mycobacterium tuberculosis not detected.   Observations/Objective:  - Able to speak in full sentences; No overt shortness of breath, wheezin or cough    Assessment and Plan:  RML pneumonia: - Clinically improving; She has been afebrile x 48-72 hour and longer having pleuritic pain. Residual cough with some production - Completed 7 days of Levaquin 506m - Recommend she take Mucinex [id number] the day and can use Delsym at bedtime  - Needs repeat CXR prior to next visit on 09/17/20   Follow Up Instructions:   -  FU scheduled on 5/23 with Dr. RamChase Caller I discussed the assessment and treatment plan with the patient. The patient was provided an opportunity to ask questions and all were answered. The patient agreed with the plan and demonstrated an understanding of the instructions.   The patient was advised to call back or seek an in-person evaluation if the symptoms worsen or if the condition fails to improve as anticipated.  I provided 22 minutes of non-face-to-face time during this encounter.   EliMartyn EhrichP

## 2020-09-17 ENCOUNTER — Ambulatory Visit (INDEPENDENT_AMBULATORY_CARE_PROVIDER_SITE_OTHER): Payer: Medicare Other | Admitting: Internal Medicine

## 2020-09-17 ENCOUNTER — Encounter: Payer: Self-pay | Admitting: Internal Medicine

## 2020-09-17 ENCOUNTER — Other Ambulatory Visit: Payer: Self-pay

## 2020-09-17 ENCOUNTER — Ambulatory Visit (INDEPENDENT_AMBULATORY_CARE_PROVIDER_SITE_OTHER): Payer: Medicare Other

## 2020-09-17 VITALS — BP 108/60 | HR 74 | Ht 64.0 in | Wt 85.4 lb

## 2020-09-17 DIAGNOSIS — J189 Pneumonia, unspecified organism: Secondary | ICD-10-CM

## 2020-09-17 DIAGNOSIS — I201 Angina pectoris with documented spasm: Secondary | ICD-10-CM

## 2020-09-17 DIAGNOSIS — Z8739 Personal history of other diseases of the musculoskeletal system and connective tissue: Secondary | ICD-10-CM | POA: Diagnosis not present

## 2020-09-17 DIAGNOSIS — Z8619 Personal history of other infectious and parasitic diseases: Secondary | ICD-10-CM | POA: Diagnosis not present

## 2020-09-17 DIAGNOSIS — R918 Other nonspecific abnormal finding of lung field: Secondary | ICD-10-CM | POA: Diagnosis not present

## 2020-09-17 NOTE — Patient Instructions (Addendum)
ICD-10-CM   1. Multiple lung nodules on CT  R91.8   2. History of MAI infection  Z86.19   3. 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   - autoimmune seriology 2018 was negative  - right side nodules on CT March 2022 is worse compared to  Sept 2021 raising concern MAI infection is back  - Bronchoscopy 08/29/20 - so far no growth on culture  - Too bad you are still symptomatic  Plan  - do blood ANA, RF, CCP, GBM, MPO, PR-3, ANCA antibody - do blood quantiferon gold test  - await final culture results for AFB and MAC by mid-June 2022  Followup  -  Telephone visit with Dr Chase Caller or NP - Mid-End June 2022   - if workup negative/non-diagnostic, then consider referral for 2nd opinion to [facility]

## 2020-09-17 NOTE — Progress Notes (Signed)
OV 06/20/2015  Chief Complaint  Patient presents with  . Follow-up    Pt had induced sputum in 111/2016 - Saw Dr [name] in 04/2015 and he stopped all 3 antibiotics -occas dry hacky cough - very little clear sputum - Denies sob, wheezing or chest discomfort    70 year old female with rheumatoid arthritis and was on methotrexate. 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 Presenting symptom was dyspnea in the background of baseline mild chronic cough. She finished 11 months of treatment. We could not get an induced sputum. I referred her to infectious diseases Dr. [name] and based on clinical grounds he stopped MAI treatment. She is now here for follow-up. She tells me that with MAI treatment only thing that improved with the dyspnea. The baseline mild chronic cough never really changed. She still has it. She rates it as a 4 out of 10. It is associated with significant sinus drainage. She tells me that she uses Flonase for her sinus drainage [place] but even then she has copious sinus drainage all year around. It is perennial. At this point in time she just wants to have simple conservative measures. She does not want referral to ENT or allergy for this.    OV 02/21/2016  Chief Complaint  Patient presents with  . Follow-up    Pt c/o increase in prod coug with little mucus production light yellow. Pt states she was 10 days of abx, 3 days pred, and IM injection of [place]. Pt states she received this becuase of her acute sinusitis.Pt states she has had rocky mtn spotted fever since being seen last.     70 year old with RA. In feb 2016 dx with  MAI with bronch following only symptom of dyspnea (Did have previous baseline mild cough that was unchanged but no sputum or fever). After this and methotrexate was stopped and she was started on MAI treatment. Dyspnea resolved with this. Based on mild cough did not change. Baseline significant sinus drainage did not change. She finished 11  months of treatment and the end of 2016 early 2017 infectious diseases stop her treatment. When I saw in February 2017 she was feeling fine other than her baseline sinus drainage and cough. She comes in for office visit today she tells me that in August 2017 she ended up with fever, chills, right upper quadrant pain, night sweats cough worsening. She denies any tick bites. However 7/10 days into the illness she was diagnosed approximately one spotted fever based on blood tests and was given 10 days of doxycycline. Around the same was also given Nexium which he broke out in a rash around her abdomen. She denies this was shingles. Subsequent to the doxycycline her symptoms resolved except that she had persistent cough and sinus drainage. This was no worse than baseline. It was also progressive after initial improvement. Subsequently approximately 9 days ago primary care physician gave her 4 days of steroids and 10 days of Omnicef. She is now on the ninth of [place]. Towards the last day of Omnicef. She is beginning to feel better with the sinus drainage and cough but still much worse than baseline. In addition last night she had a fever of 102. She is now worried that her MAI is back. She is not on any rheumatoid arthritis treatment and she feels is under control  Exhaled nitric oxide is slightly elevated at 33 ppb  PLAN 1. CT chest no change in MAI nodulaoirty compated  to 2015 2. There is mild emphysema on CT - basis: prior smoking 3. Rt ethmodi sinus partially opacified  Plan  - try low dose symbicort or breo or dulera (hher feno was slightly high) - for cough  - daily netti pot and nasal steroid  - +/- refer ENT  - if she wishes   - I prefer to do above simplistic plan before rushing off to trying MAI rx ag  OV 05/13/2016  Chief Complaint  Patient presents with  . Follow-up    Pt c/o prod cough with green mucus, sinus congestion with white mucus, increase in SOB x weeks. Pt denies f/c/s.      A 70 year old female with history of MAI infection 2015 associated with presence of emphysema on CT chest October 2017 and recurrent sinus infection  Here for follow-up. In the last month or so she's had persistent sinus congestion associated with green sputum. The last few weeks it is worse. She is now on 5 days of Levaquin and is only partially resolved. She is having green sputum. At last visit because of the presence of emphysema in October 2017 I gave her Symbicort which did not help at a baseline state and is currently helping. Even though she is still significantly symptomatic with postnasal drainage and chest congestion. She feels it is descending into her chest. She is frustrated by constant sinus drainage and recurrent sinusitis. She has never seen ENT. CT sinus in October 2017 did show partially opacified right ethmoid sinus. sHe is also wondering about the fact if her MAI has recurred    OV 08/11/2016  Chief Complaint  Patient presents with  . Follow-up    Pt states her breathing is at baseline. Pt c/o PND, sinus congestion, cough with little mucus production with clear mucus. Pt states she had a sinus infection in March and her PCP gave her 30 days of Augmentin. Pt states she saw ENT, Dr. Janace Hoard.     70 year old female with history of MAI infection 2015 manifested as dyspnea associated with presence of emphysema on CT chest October 2017 and recurrent sinusitis  After last visit in October 2017 at which time she was having significant sinus issues I referred her to ENT. Review of the note by ENT Dr. Janace Hoard. The concern is that she might have allergy rhin as opposed to an infection. She was then referred to primary care physician for allergy evaluation. Blood test reviewed from March 2018 and shows elevated IgE in the records and trace positive Aspergillus antibody. She is again denying any asthma symptoms. She is only bothered by symptoms and sinus congestion despite taking Singulair  and Augmentin. Her voice is changing because of the nasal twang. She'll follow-up again with Dr. Janace Hoard if she has a flareup. In terms of her MAI symptoms October 2017 CT chest did show some infiltrates consistent with MAI as opposed ABPA. She is not having much of pulmonary symptoms of shortness of breath or cough. At this point in time she will be content with observation expectant follow-up.   OV 02/16/2017  Chief Complaint  Patient presents with  . Follow-up    CT done 02/10/17.  Pt states that she has been doing good except she has had some sinus congestion due to allergies. C/o occ. cough and SOB especially when she has a flare up with her allergies and sinuses. Denies any CP. Pt states that her BP got as low as 60/40 Thursday morning, 19/82.   70 year old female with  history of MAI associated with mild bronchiectasis and recurrent sinusitis   Last visit was in April 2018 at the time because of mild emphysema on the CT scan with Spiriva. She only uses this as needed. She says it helps with the dyspnea and cough when she has a severe bout of cough associated with sinus congestion. She says that shortness of breath does not bother her as much. She wants to use his Spiriva as needed. She is asking for samples. Her main issues that she is still bothered by sinus congestion. She says that Dr. Janace Hoard is labeled this is allergic rhinitis. But she seems very consumed by her chronic sinus congestion and postnasal drip and the cough associated with this. She says despite all these instructions from ENT Dr. Janace Hoard she has not improved at all and it is very frustrating. She denies any vascular disease but she did admit to a history of rheumatoid arthritis diagnosed sometime around 2008-2010. Is unclear what treatment she is on. Off note she is scheduled for cataract surgery and she wants to know if it is okay to do this from a cough standpoint  IMPRESSION: 1. No acute cardiopulmonary abnormalities. 2. 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 3. Aortic Atherosclerosis (ICD10-I70.0) and Emphysema (ICD10-J43.9). Coronary artery calcifications noted in the LAD.   Electronically Signed   By: Kerby Moors M.D.   On: 02/10/2017 13:57   OV 06/16/2017  Chief Complaint  Patient presents with  . Follow-up    Pt states she has seen multiple doctors due to issues with allergies. Pt does have an occ. cough from postnasal drainage and has SOB when coughing a lot. Pt stated Dr. Benjamine Mola said symptoms are from acid reflux.   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  Previous MAI infection with treatment and CT scan October 2018 with improvement.  Has chronic sinusitis  Since my last visit with her several months ago she says she has seen allergist Dr. Donneta Romberg.  Had extensive allergy testing was negative but nevertheless placed on Singulair.  Subsequently sent to second ENT opinion by Dr. Lorelee Cover.  ENT exam apparently showed suggestive signs of acid reflux placed on Prilosec therapy.  Despite this chronic nasal congestion continues.  She is blowing out a lot of mucus in the morning.  In addition she is clearing the throat a lot.  She also has a cough.  She says the symptoms do not wake her up in the middle of the night at all.  Symptoms are present only in the daytime.  There is no chest pain.  Her voice feels hoarse and she has a ticklish sensation.    OV 01/12/2018  Subjective:  Patient ID: [name], female , DOB: 1951-01-29 , age 67 y.o. , MRN: [id number] , ADDRESS: [address] [place] [address]   01/12/2018 -   Chief Complaint  Patient presents with  . Follow-up    Pt states she has been doing good since last visit. States she is still coughing some.     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  Previous MAI infection with treatment and CT scan October 2018 with improvement.  Has chronic sinusitis  and cough neuroapathy   HPI Alyda Megna 70 y.o. 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 Since last seeing me she went and saw ENT. She was advised voice rehabilitation and gabapentin but she has deferred this. She plans to see a  Rhinologist at  [facility] Dr Pearlie Oyster. She continues to have mild sinus drainage andclearing of the throat all unchanged.  no wheezing no shortness of breath. No chest tightness. According to her history she recently saw primary care physician and the physical exam and blood work were normal otherwise. She will have a high dose flu shot today. She smoked heavily in the past quitting in the year 2000. She smoked 1.5 packs per day 30 of 40 years. A sister was diagnosed with lung cancer age of 75.     OV 07/13/2018  Subjective:  Patient ID: [name], female , DOB: 1951-04-13 , age 70 y.o. , MRN: [id number] , ADDRESS: [address] [place] [address]   07/13/2018 -   Chief Complaint  Patient presents with  . 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  Previous MAI infection with treatment and CT scan October 2018 with improvement.  Has chronic sinusitis and cough neuroapathy     HPI Anselma Herbel 70 y.o. -presents for follow-up.  Cough is fine.  Her husband had suspected influenza last week and he has recovered from it.  Patient herself is asymptomatic.  She had a lot of questions about coronavirus 19 and social distancing.  Otherwise she is well.  She had a CT scan of the chest that shows new onset of 8 mm right lower lobe nodule compared to October 2018.  She has a history of rheumatoid arthritis, MAI and previous smoking but also her sister died from lung cancer.  I personally visualized the nodule and does not look spiculated.   IMPRESSION: 1. Largely stable exam since 02/10/2017 although 5 mm right middle lobe and 8 mm right lower lobe pulmonary nodules are new in the interval. Non-contrast chest  CT at 3-6 months is recommended. If the nodules are stable at time of repeat CT, then future CT at 18-24 months (from today's scan) is considered optional for low-risk patients, but is recommended for high-risk patients. This recommendation follows the consensus statement: Guidelines for Management of Incidental Pulmonary Nodules Detected on CT Images: From the Fleischner Society 2017; Radiology 2017; 284:228-243. 2.  Emphysema. (ICD10-J43.9) 3.  Aortic Atherosclerois (ICD10-170.0)   Electronically Signed   By: Misty Stanley M.D.   On: 07/09/2018 13:34  OV 12/28/2018  Subjective:  Patient ID: [name], female , DOB: January 18, 1951 , age 22 y.o. , MRN: [id number] , ADDRESS: [address] [place] [address]   12/28/2018 -   Chief Complaint  Patient presents with  . Bronchiectasis without complication    Feels breathing is worse since last visit. Has some shortness of breath and wheeizing on occasion.   Known RA patient (prior mtx). Former smoker Previous MAI infection with treatment and CT scan October 2018 with improvement.  Lung nodules - improved march 2020 -> aug 2020 Has chronic sinusitis and cough neuroapathy    HPI Jakki Doughty 70 y.o. -returns for follow-up.  Last visit was in the spring 2020.  At this point in time she has lost 3 pounds of weight but she says this is due to active exercise.  She continues to be bothered by her chronic cough with clearing of the throat.  She had an appointment at [facility] but because of the  [place] did not follow-up.  She is requesting a referral.  She also feels that her cough is causing shortness of breath.  Particularly early in the morning.  For the first 1 hour after she wakes up when she moves around she feels dyspneic.  She is constantly clearing her throat.  She feels a constant presence of postnasal drip.  She also has lung nodules.  We were concerned this might be cancer.  She did a follow-up CT scan of the  chest in August 2020.  I reviewed the report.  1 of the nodules is better if the other one is stable.  Her neck CT scan is now due in 1 year.  On the CT scan there is no evidence of bronchiectasis reported or COPD or interstitial lung disease.  During her regular walks she does not feel much dyspnea.  She is keen to have the high-dose flu shot today.    CT chest aug 2020 CLINICAL DATA:  F/u solitary pulmonary nodule No hx of sx No hx of CA Former smokerLung nodule, >=1cm  EXAM: CT CHEST WITHOUT CONTRAST  TECHNIQUE: Multidetector CT imaging of the chest was performed following the standard protocol without IV contrast.  COMPARISON:  CT July 09, 2018  FINDINGS: Cardiovascular: Coronary artery calcification and aortic atherosclerotic calcification.  Mediastinum/Nodes: No axillary or supraclavicular adenopathy. No mediastinal hilar adenopathy. No pericardial effusion. Esophagus normal.  Lungs/Pleura: In the RIGHT lower lobe nodule of concern measures 3 mm x 3 mm compared to 8 mm x 8 mm for adduction size (image 95/3) nodule in the RIGHT middle lobe measures 4 mm (image 88/3 unchanged from 5 mm. No new nodularity.  Upper Abdomen: Limited view of the liver, kidneys, pancreas are unremarkable. Normal adrenal glands.  Musculoskeletal: No aggressive osseous lesion.  IMPRESSION: 1. Decrease in size of the larger RIGHT lower lobe pulmonary nodule suggests benign etiology. 2. Stable RIGHT middle lobe pulmonary nodule at 4-5 mm. Consider follow-up CT scan in 12 month per Fleischner criteria.   Electronically Signed   By: Suzy Bouchard M.D.   On: 12/20/2018 15:19 ROS - per HPI   OV 06/20/2019  Subjective:  Patient ID: [name], female , DOB: [birth year] , age 89 y.o. , MRN: [id number] , ADDRESS: [address] [place] [address]   06/20/2019 -   Chief Complaint  Patient presents with  . Follow-up    Pt states she has been doing okay since last visit.  States she is still coughing.    Known RA patient (prior mtx). Former smoker Previous MAI infection with treatment and CT scan October 2018 with improvement.  Lung nodules - improved march 2020 -> aug 2020 Has chronic sinusitis and cough neuroapathy   HPI Iara Monds 70 y.o. -returns for follow-up of the above issues.  In the interim she is visited Dr. Dennison Mascot right x2 and also done voice rehabilitation.  With this the cough is better.  RSI cough score is 11.  She asked Dr. Joya Gaskins if her sinus drainage will ever resolve and apparently he was pessimistic about it.  Nevertheless she is feeling better.  She does not have much of new symptoms  She has pulmonary nodules that require follow-up in the summer 2021 which would be a 1 year scan  She has a new question new issue about COVID-19 vaccine.  She is reluctant to take it because of bee allergy anaphylaxis history.  But she has had other vaccines without any problems.  Her husband did have COVID-19 vaccine without any issues.  We spent some time discussing this.  She feels social distancing and masking along with daily exercise and the availability of  monoclonal antibody treatment might be good enough risk protectors against Covid.    OV 01/24/2020   Subjective:  Patient ID: [name], female , DOB: 1950/12/14, age 69 y.o. years. , MRN: [id number],  ADDRESS: [address] [place] [address] PCP  Ernestene Kiel, MD Providers : Treatment Team:  Attending Provider: Brand Males, MD  Known RA patient (prior mtx). Former smoker Previous MAI infection with treatment and CT scan October 2018 with improvement.  Lung nodules - improved march 2020 -> aug 2020 Has chronic sinusitis and cough neuroapathy    Chief Complaint  Patient presents with  . Follow-up    cough remains the same.  discuss CT scan.       HPI Radiance Deady 70 y.o. -presents for follow-up.  In the interim she feels stable.  Cough and  irritable larynx syndrome and shortness of breath are minimal.  She continues to exercise regularly with walks.  She is lost weight because of that but this is intentional.  She has had a Covid vaccine [employer].  This gave her significant flulike symptoms and therefore she is reluctant to take the booster.  Overall she is stable.  She had a follow-up CT scan of the chest for 1 year follow-up of the lung nodules.  Her right-sided nodules are improved but she has a 7 mm new lung nodule but symptom wise she is not any different.  She will have a high-dose flu shot today.  There are no other issues.    Dr Lorenza Cambridge Reflux Symptom Index (> 13-15 suggestive of LPR cough) 0 -> 5  =  none ->severe problem 06/20/2019   Hoarseness of problem with voice 1  Clearing  Of Throat 2.5  Excess throat mucus or feeling of post nasal drip 4  Difficulty swallowing food, liquid or tablets 0  Cough after eating or lying down 0  Breathing difficulties or choking episodes 0  Troublesome or annoying cough 2.5  Sensation of something sticking in throat or lump in throat 1  Heartburn, chest pain, indigestion, or stomach acid coming up 0  TOTAL 11     CLINICAL DATA:  Abnormal x-ray.  Pulmonary nodule.  EXAM: CT CHEST WITHOUT CONTRAST  TECHNIQUE: Multidetector CT imaging of the chest was performed following the standard protocol without IV contrast.  COMPARISON:  Sep 19, 2018  FINDINGS: Cardiovascular: The heart size is normal. No substantial pericardial effusion. Coronary artery calcification is evident. Atherosclerotic calcification is noted in the wall of the thoracic aorta.  Mediastinum/Nodes: No mediastinal lymphadenopathy. No evidence for gross hilar lymphadenopathy although assessment is limited by the lack of intravenous contrast on today's study. The esophagus has normal imaging features. There is no axillary lymphadenopathy.  Lungs/Pleura: Centrilobular emphsyema noted.  Biapical pleuroparenchymal scarring evident. Subtle tree-in-bud nodularity posterior right upper lobe on 73/7 is stable. 4 mm posterior right upper lobe pulmonary nodule (97/7) is stable in the interval with adjacent tiny tree-in-bud nodules, likely sequelae of prior atypical infection. The 3 mm index nodule measured on the previous study that had decreased is similar at 2-3 mm today with interval resolution of the adjacent tiny nodule seen previously. Scarring in the base of the right middle lobe is unchanged.  There is a new cluster of nodularity in the left upper lobe with dominant nodule measuring 7 mm on image 97/7. Subtle new cluster of tiny nodules noted posterior left upper lobe on 97/7. Stable scarring in the inferior lingula  No focal airspace consolidation.  No  pleural effusion.  Upper Abdomen: Unremarkable.  Musculoskeletal: No worrisome lytic or sclerotic osseous abnormality.  IMPRESSION: 1. Waxing and waning of clustered tiny pulmonary nodules bilaterally. Right lower lobe nodule of concern shows continued further decrease in size with interval development of new clustered nodularity in the peripheral left lung today. Imaging features likely represent areas of scarring/atypical infection of varying chronicity. Dominant new nodule today is 7 mm in the left upper lobe. Non-contrast chest CT at 3-6 months is recommended. If the nodules are stable at time of repeat CT, then future CT at 18-24 months (from today's scan) is considered optional for low-risk patients, but is recommended for high-risk patients. This recommendation follows the consensus statement: Guidelines for Management of Incidental Pulmonary Nodules Detected on CT Images: From the Fleischner Society 2017; Radiology 2017; 284:228-243. 2. Other scattered tiny bilateral pulmonary nodules are stable in the interval. 3. Aortic Atherosclerosis (ICD10-I70.0) and Emphysema  (ICD10-J43.9).   Electronically Signed   By: Misty Stanley M.D.   On: 01/03/2020 11:16   OV 07/26/2020  Subjective:  Patient ID: [name], female , DOB: [birth year] , age 87 y.o. , MRN: [id number] , ADDRESS: [address] [place] [address] PCP Ernestene Kiel, MD Patient Care Team: Ernestene Kiel, MD as PCP - General (Internal Medicine) Elsie Stain, MD as Consulting Physician (Pulmonary Disease) Richardo Priest, MD as Referring Physician (Cardiology)  This Provider for this visit: Treatment Team:  Attending Provider: Brand Males, MD    07/26/2020 -   Chief Complaint  Patient presents with  . Follow-up    Doing well, review CT    Known RA patient (prior mtx). Former smoker Previous MAI infection with treatment and CT scan October 2018 with improvement.  Lung nodules - improved march 2020 -> aug 2020 Has chronic sinusitis and cough neuroapathy  HPI Riyan Gavina 70 y.o. -returns for follow-up.  She is feeling well.  She had follow-up CT scan of the chest to follow-up of one single nodule that was getting worse potentially but then there are many more nodules and infiltrates are worse.  I visualized the CT scan with and compared to September 2021 definitely agree with the radiologist below.  There are no other new issues.  We discussed the possibility that the MAI infection is back.  She agrees with it.  She is surprised because she took 11 months of treatment.  We discussed in do sputum but she has tried this in the past and that did not work.    CT Chest data 07/09/20   IMPRESSION: 1. Multiple scattered bilateral areas of clustered and tree-in-bud centrilobular nodularity are again seen. Several areas of nodularity are new or worsened compared to prior examination, for example in the medial left upper lobe and in both the medial and lateral segments of the right middle lobe. 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 2. Emphysema. 3. Coronary artery disease.  Aortic Atherosclerosis (ICD10-I70.0) and Emphysema (ICD10-J43.9).   Electronically Signed   By: Eddie Candle M.D.   On: 07/09/2020 13:54   OV 09/17/2020  Subjective:  Patient ID: Buelah Manis, female , DOB: February 19, 1951 , age 32 y.o. , MRN: [id number] , ADDRESS: [address] [place] [address] PCP Ernestene Kiel, MD Patient Care Team: Ernestene Kiel, MD as PCP - General (Internal Medicine) Elsie Stain, MD as Consulting Physician (Pulmonary Disease) Richardo Priest, MD as Referring Physician (Cardiology)  This Provider for this visit: Treatment Team:  Attending Provider: Brand Males, MD    09/17/2020 -   Chief Complaint  Patient presents with  . Follow-up    Chest xray performed today. Pt states she is doing better since last visit. Has some occ SOB and an occ cough.     HPI MARYELLA ABOOD 70 y.o. -returns for follow-up to discuss bronchoscopy results.  Bronchoscopy was done on 08/29/2020.  So far culture negative.  She has mixed cellularity with neutrophils and lymphocytes.  She continues to have chronic symptoms.  She is frustrated that the symptoms still continue.  She is also possible that the results are not back growing anything on culture.  Of note, in 2016 she grew MAI.  Also of note 1 day after the bronchoscopy she picked up fever chest x-ray showed right middle lobe infiltrates at the place of lavage.  She was given antibiotics.  She is better now.  She is wondering if her problems could be because of autoimmune.  Autoimmune serology was last checked in 2018 and normal.  Since then no stigmata of connective tissue disease or vasculitis.  She is willing to get this rechecked.  We discussed that if etiology is nondiagnostic after a few more weeks of waiting for the cultures then we should consider referral to [facility] for second opinion.  She is open to this  idea.    CT Chest data  DG Chest 2 View  Result Date: 09/17/2020 CLINICAL DATA:  Follow-up right middle lobe pneumonia EXAM: CHEST - 2 VIEW COMPARISON:  08/30/2020 04/12/2014 FINDINGS: Heart size within normal limits. No pulmonary vascular congestion. Lungs are hyperexpanded. Interval improvement in aeration of the right middle lobe. Some opacity still remains indicative of resolving pneumonia. IMPRESSION: Interval improvement of right middle lobe airspace opacity indicative of resolving pneumonia. Continued follow-up to complete resolution recommended. Electronically Signed   By: Miachel Roux M.D.   On: 09/17/2020 10:04      PFT  No flowsheet data found.     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   reports that she quit smoking about 22 years ago. Her smoking use included cigarettes. She has a 25.00 pack-year smoking history. She has never used smokeless tobacco.  Past Surgical History:  Procedure Laterality Date  . bilateral  tubal ligation    . BRONCHIAL WASHINGS  08/29/2020   Procedure: BRONCHIAL WASHINGS;  Surgeon: Brand Males, MD;  Location: [facility];  Service: Endoscopy;;  . CARDIAC CATHETERIZATION    . LUMBAR SPINE SURGERY    . strabismus repair    . TONSILLECTOMY AND ADENOIDECTOMY    . VIDEO BRONCHOSCOPY Bilateral 05/17/2014   Procedure: VIDEO BRONCHOSCOPY WITHOUT FLUORO;  Surgeon: Elsie Stain, MD;  Location: [facility];  Service: Cardiopulmonary;  Laterality: Bilateral;  . VIDEO BRONCHOSCOPY N/A 08/29/2020   Procedure: VIDEO BRONCHOSCOPY WITHOUT FLUORO;  Surgeon: Brand Males, MD;  Location: [facility];  Service: Endoscopy;  Laterality: N/A;    Allergies  Allergen Reactions  . [place]et Venom [Bee Venom] Anaphylaxis    Hives/swelling  . Omnicef [Cefdinir]     rash  . Plavix [Clopidogrel Bisulfate]     rash    Immunization History  Administered Date(s) Administered  . Fluad Quad(high Dose 65+) 12/28/2018, 01/24/2020  . Influenza, High Dose Seasonal PF 01/22/2016, 01/12/2018  . Influenza-Unspecified 01/26/2014, 01/16/2015, 02/09/2017  . Janssen (J&J) SARS-COV-2 Vaccination 08/06/2019  . Pneumococcal Conjugate-13 02/19/2015    Family History  Problem Relation Age of Onset  . Emphysema Mother   . Heart disease Mother   . Lung cancer Mother   . Prostate cancer Father   . Lung cancer Sister      Current Outpatient Medications:  .  aspirin 81 MG tablet, Take 81 mg by mouth at bedtime. Every other day per pcp, Disp: , Rfl:  .  atorvastatin (LIPITOR) 80 MG tablet, Take 40 mg by mouth at bedtime., Disp: , Rfl:  .  clonazePAM (KLONOPIN) 0.5 MG tablet, Take 0.5 mg by mouth daily., Disp: , Rfl:  .  EPINEPHrine 0.3 mg/0.3 mL IJ SOAJ injection, Inject 0.3 mg into the muscle as needed. , Disp: , Rfl:  .  ipratropium (ATROVENT) 0.06 % nasal spray, Place 2 sprays into both nostrils in the morning and at bedtime., Disp: , Rfl: 5 .  loratadine (CLARITIN) 10 MG tablet, Take 10 mg  by mouth daily., Disp: , Rfl:  .  metoprolol tartrate (LOPRESSOR) 25 MG tablet, Take 1 tablet (25 mg total) by mouth at bedtime. (BETA BLOCKER) (Patient taking differently: Take 12.5 mg by mouth at bedtime. Taking 1/2 tablet  (BETA BLOCKER)), Disp: 90 tablet, Rfl: 3 .  nitroGLYCERIN (NITROSTAT) 0.4 MG SL tablet, Place 0.4 mg under the tongue every 5 (five) minutes as needed for chest pain., Disp: , Rfl:  .  tiZANidine (ZANAFLEX) 4 MG tablet, Take 4 mg by mouth at bedtime., Disp: , Rfl:       Objective:   Vitals:   09/17/20 0943  BP: 108/60  Pulse: 74  SpO2: 99%  Weight: 85 lb 6.4 oz (38.7 kg)  Height: _0  (1.626 m)    Estimated body mass index is 14.66 kg/m as calculated from the following:   Height as of this encounter: _1  (1.626 m).   Weight as of this encounter: 85 lb 6.4 oz (38.7 kg).  _2 @  [employer]   09/17/20 0943  Weight: 85 lb 6.4 oz (38.7 kg)     Physical Exam   Alert and oriented x3.  Speech normal.  Clear to auscultation bilaterally normal heart sounds.  Abdomen soft.  No stigmata of connective tissue disease. Assessment:       ICD-10-CM   1. Multiple lung nodules on CT  R91.8   2. History of MAI infection  Z86.19   3. History of rheumatoid arthritis  Z87.39        Plan:     Patient Instructions     ICD-10-CM   1. Multiple lung nodules on CT  R91.8   2. History of MAI infection  Z86.19   3. 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   - autoimmune seriology 2018 was negative  - right side nodules on CT March 2022 is worse compared to  Sept 2021 raising concern MAI infection is back  - Bronchoscopy 08/29/20 - so far no growth on culture  -  Too bad you are still symptomatic  Plan  - do blood ANA, RF, CCP, GBM, MPO, PR-3, ANCA antibody - do blood quantiferon gold test  - await final culture results for AFB and MAC by mid-June 2022  Followup  -  Telephone visit with Dr Chase Caller or NP - Mid-End June 2022   - if workup negative/non-diagnostic, then consider referral for 2nd opinion to [facility]  ( Level 03: [facility] 20-29 min it spent in visit type: on-site physical face to visit in total care time and counseling or/and coordination of care by this undersigned MD - Dr Brand Males. This includes one or more of the following all delivered on this same day 09/17/2020: pre-charting, chart review, note writing, documentation discussion of test results, diagnostic or treatment recommendations, prognosis, risks and benefits of management options, instructions, education, compliance or risk-factor reduction. It excludes time spent by the [place] or office staff in the care of the patient. Actual time was 36)    SIGNATURE    Dr. Brand Males, M.D., F.C.C.P,  Pulmonary and Critical Care Medicine Staff Physician, [place] Director - Interstitial Lung Disease  Program  [facility] [place] [facility] [place], [place], [address]  Pager: [phone], If no answer or between  15:00h - 7:00h: call [phone] Telephone: [phone]  10:09 AM 09/17/2020

## 2020-09-20 LAB — CYCLIC CITRUL PEPTIDE ANTIBODY, IGG: Cyclic Citrullin Peptide Ab: <16 UNITS

## 2020-09-20 LAB — QUANTIFERON-TB GOLD PLUS
Mitogen-NIL: >10 IU/mL
NIL: 0.11 IU/mL
QuantiFERON-TB Gold Plus: NEGATIVE
TB1-NIL: <0 IU/mL
TB2-NIL: <0 IU/mL

## 2020-09-20 LAB — RHEUMATOID FACTOR: Rheumatoid fact SerPl-aCnc: <14 IU/mL (ref <14)

## 2020-09-20 LAB — GLOMERULAR BASEMENT MEMBRANE ANTIBODIES: GBM Ab: <1 AI

## 2020-09-20 LAB — ANCA SCREEN W REFLEX TITER: ANCA Screen: NEGATIVE

## 2020-09-20 LAB — MPO/PR-3 (ANCA) ANTIBODIES
Myeloperoxidase Abs: <1 AI
Serine Protease 3: <1 AI

## 2020-09-20 LAB — ANA: Anti Nuclear Antibody (ANA): NEGATIVE

## 2020-09-28 LAB — FUNGUS CULTURE WITH STAIN

## 2020-09-28 LAB — FUNGUS CULTURE RESULT

## 2020-09-28 LAB — FUNGAL ORGANISM REFLEX

## 2020-10-10 ENCOUNTER — Telehealth: Payer: Self-pay | Admitting: Internal Medicine

## 2020-10-10 NOTE — Telephone Encounter (Signed)
Called and spoke to patient, who is requesting lab and culture results.  Dr. Marchelle Gearing, please advise. Thanks

## 2020-10-10 NOTE — Telephone Encounter (Signed)
So far results negative. But please call micro lab and ensure there is no MAC growing because she had MAC in past

## 2020-10-10 NOTE — Telephone Encounter (Signed)
Called and spoke with patient. She is aware of results. Nothing further needed at time of call.

## 2020-10-12 LAB — ACID FAST CULTURE WITH REFLEXED SENSITIVITIES (MYCOBACTERIA): Acid Fast Culture: NEGATIVE

## 2020-10-18 ENCOUNTER — Ambulatory Visit (INDEPENDENT_AMBULATORY_CARE_PROVIDER_SITE_OTHER): Payer: Medicare Other | Admitting: Internal Medicine

## 2020-10-18 ENCOUNTER — Other Ambulatory Visit: Payer: Self-pay

## 2020-10-18 DIAGNOSIS — R918 Other nonspecific abnormal finding of lung field: Secondary | ICD-10-CM

## 2020-10-18 NOTE — Progress Notes (Signed)
OV 06/20/2015  Chief Complaint  Patient presents with   Follow-up    Pt had induced sputum in 111/2016 - Saw Dr [name] in 04/2015 and he stopped all 3 antibiotics -occas dry hacky cough - very little clear sputum - Denies sob, wheezing or chest discomfort    70 year old female with rheumatoid arthritis and was on methotrexate. 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 Presenting symptom was dyspnea in the background of baseline mild chronic cough. She finished 11 months of treatment. We could not get an induced sputum. I referred her to infectious diseases Dr. [name] and based on clinical grounds he stopped MAI treatment. She is now here for follow-up. She tells me that with MAI treatment only thing that improved with the dyspnea. The baseline mild chronic cough never really changed. She still has it. She rates it as a 4 out of 10. It is associated with significant sinus drainage. She tells me that she uses Flonase for her sinus drainage [place] but even then she has copious sinus drainage all year around. It is perennial. At this point in time she just wants to have simple conservative measures. She does not want referral to ENT or allergy for this.    OV 02/21/2016  Chief Complaint  Patient presents with   Follow-up    Pt c/o increase in prod coug with little mucus production light yellow. Pt states she was 10 days of abx, 3 days pred, and IM injection of [place]. Pt states she received this becuase of her acute sinusitis.Pt states she has had rocky mtn spotted fever since being seen last.     70 year old with RA. In feb 2016 dx with  MAI with bronch following only symptom of dyspnea (Did have previous baseline mild cough that was unchanged but no sputum or fever). After this and methotrexate was stopped and she was started on MAI treatment. Dyspnea resolved with this. Based on mild cough did not change. Baseline significant sinus drainage did not change. She finished 11  months of treatment and the end of 2016 early 2017 infectious diseases stop her treatment. When I saw in February 2017 she was feeling fine other than her baseline sinus drainage and cough. She comes in for office visit today she tells me that in August 2017 she ended up with fever, chills, right upper quadrant pain, night sweats cough worsening. She denies any tick bites. However 7/10 days into the illness she was diagnosed approximately one spotted fever based on blood tests and was given 10 days of doxycycline. Around the same was also given Nexium which he broke out in a rash around her abdomen. She denies this was shingles. Subsequent to the doxycycline her symptoms resolved except that she had persistent cough and sinus drainage. This was no worse than baseline. It was also progressive after initial improvement. Subsequently approximately 9 days ago primary care physician gave her 4 days of steroids and 10 days of Omnicef. She is now on the ninth of [place]. Towards the last day of Omnicef. She is beginning to feel better with the sinus drainage and cough but still much worse than baseline. In addition last night she had a fever of 102. She is now worried that her MAI is back. She is not on any rheumatoid arthritis treatment and she feels is under control  Exhaled nitric oxide is slightly elevated at 33 ppb  PLAN 1. CT chest no change in MAI nodulaoirty compated  to 2015 2. There is mild emphysema on CT - basis: prior smoking 3. Rt ethmodi sinus partially opacified  Plan  - try low dose symbicort or breo or dulera (hher feno was slightly high) - for cough  - daily netti pot and nasal steroid  - +/- refer ENT  - if she wishes   - I prefer to do above simplistic plan before rushing off to trying MAI rx ag  OV 05/13/2016  Chief Complaint  Patient presents with   Follow-up    Pt c/o prod cough with green mucus, sinus congestion with white mucus, increase in SOB x weeks. Pt denies f/c/s.     A  70 year old female with history of MAI infection 2015 associated with presence of emphysema on CT chest October 2017 and recurrent sinus infection  Here for follow-up. In the last month or so she's had persistent sinus congestion associated with green sputum. The last few weeks it is worse. She is now on 5 days of Levaquin and is only partially resolved. She is having green sputum. At last visit because of the presence of emphysema in October 2017 I gave her Symbicort which did not help at a baseline state and is currently helping. Even though she is still significantly symptomatic with postnasal drainage and chest congestion. She feels it is descending into her chest. She is frustrated by constant sinus drainage and recurrent sinusitis. She has never seen ENT. CT sinus in October 2017 did show partially opacified right ethmoid sinus. sHe is also wondering about the fact if her MAI has recurred    OV 08/11/2016  Chief Complaint  Patient presents with   Follow-up    Pt states her breathing is at baseline. Pt c/o PND, sinus congestion, cough with little mucus production with clear mucus. Pt states she had a sinus infection in March and her PCP gave her 30 days of Augmentin. Pt states she saw ENT, Dr. Janace Hoard.     70 year old female with history of MAI infection 2015 manifested as dyspnea associated with presence of emphysema on CT chest October 2017 and recurrent sinusitis  After last visit in October 2017 at which time she was having significant sinus issues I referred her to ENT. Review of the note by ENT Dr. Janace Hoard. The concern is that she might have allergy rhin as opposed to an infection. She was then referred to primary care physician for allergy evaluation. Blood test reviewed from March 2018 and shows elevated IgE in the records and trace positive Aspergillus antibody. She is again denying any asthma symptoms. She is only bothered by symptoms and sinus congestion despite taking Singulair and  Augmentin. Her voice is changing because of the nasal twang. She'll follow-up again with Dr. Janace Hoard if she has a flareup. In terms of her MAI symptoms October 2017 CT chest did show some infiltrates consistent with MAI as opposed ABPA. She is not having much of pulmonary symptoms of shortness of breath or cough. At this point in time she will be content with observation expectant follow-up.   OV 02/16/2017  Chief Complaint  Patient presents with   Follow-up    CT done 02/10/17.  Pt states that she has been doing good except she has had some sinus congestion due to allergies. C/o occ. cough and SOB especially when she has a flare up with her allergies and sinuses. Denies any CP. Pt states that her BP got as low as 60/40 Thursday morning, 69/56.   70 year old female with  history of MAI associated with mild bronchiectasis and recurrent sinusitis   Last visit was in April 2018 at the time because of mild emphysema on the CT scan with Spiriva. She only uses this as needed. She says it helps with the dyspnea and cough when she has a severe bout of cough associated with sinus congestion. She says that shortness of breath does not bother her as much. She wants to use his Spiriva as needed. She is asking for samples. Her main issues that she is still bothered by sinus congestion. She says that Dr. Janace Hoard is labeled this is allergic rhinitis. But she seems very consumed by her chronic sinus congestion and postnasal drip and the cough associated with this. She says despite all these instructions from ENT Dr. Janace Hoard she has not improved at all and it is very frustrating. She denies any vascular disease but she did admit to a history of rheumatoid arthritis diagnosed sometime around 2008-2010. Is unclear what treatment she is on. Off note she is scheduled for cataract surgery and she wants to know if it is okay to do this from a cough standpoint  IMPRESSION: 1. No acute cardiopulmonary abnormalities. 2. 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 3. Aortic Atherosclerosis (ICD10-I70.0) and Emphysema (ICD10-J43.9). Coronary artery calcifications noted in the LAD.     Electronically Signed   By: Kerby Moors M.D.   On: 02/10/2017 13:57   OV 06/16/2017  Chief Complaint  Patient presents with   Follow-up    Pt states she has seen multiple doctors due to issues with allergies. Pt does have an occ. cough from postnasal drainage and has SOB when coughing a lot. Pt stated Dr. Benjamine Mola said symptoms are from acid reflux.   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  Previous MAI infection with treatment and CT scan October 2018 with improvement.  Has chronic sinusitis  Since my last visit with her several months ago she says she has seen allergist Dr. Donneta Romberg.  Had extensive allergy testing was negative but nevertheless placed on Singulair.  Subsequently sent to second ENT opinion by Dr. Lorelee Cover.  ENT exam apparently showed suggestive signs of acid reflux placed on Prilosec therapy.  Despite this chronic nasal congestion continues.  She is blowing out a lot of mucus in the morning.  In addition she is clearing the throat a lot.  She also has a cough.  She says the symptoms do not wake her up in the middle of the night at all.  Symptoms are present only in the daytime.  There is no chest pain.  Her voice feels hoarse and she has a ticklish sensation.    OV 01/12/2018  Subjective:  Patient ID: Erin Black, female , DOB: 04-18-1951 , age 43 y.o. , MRN: [id number] , ADDRESS: [address] [place] [address]   01/12/2018 -   Chief Complaint  Patient presents with   Follow-up    Pt states she has been doing good since last visit. States she is still coughing some.     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  Previous MAI infection with treatment and CT scan October 2018 with improvement.  Has chronic sinusitis  and cough neuroapathy   HPI Cassity Christian 70 y.o. 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 Since last seeing me she went and saw ENT. She was advised voice rehabilitation and gabapentin but she has deferred this. She plans to see a  Rhinologist at [place]. She continues to have mild sinus drainage andclearing of the throat all unchanged.  no wheezing no shortness of breath. No chest tightness. According to her history she recently saw primary care physician and the physical exam and blood work were normal otherwise. She will have a high dose flu shot today. She smoked heavily in the past quitting in the year 2000. She smoked 1.5 packs per day 30 of 40 years. A sister was diagnosed with lung cancer age of 15.     OV 07/13/2018  Subjective:  Patient ID: Erin Black, female , DOB: 22-Mar-1951 , age 67 y.o. 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  Previous MAI infection with treatment and CT scan October 2018 with improvement.  Has chronic sinusitis and cough neuroapathy     HPI Enjoli Tidd 70 y.o. -presents for follow-up.  Cough is fine.  Her husband had suspected influenza last week and he has recovered from it.  Patient herself is asymptomatic.  She had a lot of questions about coronavirus 19 and social distancing.  Otherwise she is well.  She had a CT scan of the chest that shows new onset of 8 mm right lower lobe nodule compared to October 2018.  She has a history of rheumatoid arthritis, MAI and previous smoking but also her sister died from lung cancer.  I personally visualized the nodule and does not look spiculated.   IMPRESSION: 1. Largely stable exam since 02/10/2017 although 5 mm right middle lobe and 8 mm right lower lobe pulmonary nodules are new in the interval. Non-contrast chest  CT at 3-6 months is recommended. If the nodules are stable at time of repeat CT, then future CT at 18-24 months (from today's scan) is considered optional for low-risk patients, but is recommended for high-risk patients. This recommendation follows the consensus statement: Guidelines for Management of Incidental Pulmonary Nodules Detected on CT Images: From the Fleischner Society 2017; Radiology 2017; 284:228-243. 2.  Emphysema. (ICD10-J43.9) 3.  Aortic Atherosclerois (ICD10-170.0)     Electronically Signed   By: Misty Stanley M.D.   On: 07/09/2018 13:34  OV 12/28/2018  Subjective:  Patient ID: Erin Black, female , DOB: January 16, 1951 , age 53 y.o. , MRN: [id number] , ADDRESS: [address] [place] [address]   12/28/2018 -   Chief Complaint  Patient presents with   Bronchiectasis without complication    Feels breathing is worse since last visit. Has some shortness of breath and wheeizing on occasion.   Known RA patient (prior mtx). Former smoker Previous MAI infection with treatment and CT scan October 2018 with improvement.  Lung nodules - improved march 2020 -> aug 2020 Has chronic sinusitis and cough neuroapathy    HPI Tess Potts 70 y.o. -returns for follow-up.  Last visit was in the spring 2020.  At this point in time she has lost 3 pounds of weight but she says this is due to active exercise.  She continues to be bothered by her chronic cough with clearing of the throat.  She had an appointment at [facility] but because of the  [place] did not follow-up.  She is requesting a referral.  She also feels that her cough is causing shortness of breath.  Particularly early  in the morning.  For the first 1 hour after she wakes up when she moves around she feels dyspneic.  She is constantly clearing her throat.  She feels a constant presence of postnasal drip.  She also has lung nodules.  We were concerned this might be cancer.  She did a follow-up CT scan of the  chest in August 2020.  I reviewed the report.  1 of the nodules is better if the other one is stable.  Her neck CT scan is now due in 1 year.  On the CT scan there is no evidence of bronchiectasis reported or COPD or interstitial lung disease.  During her regular walks she does not feel much dyspnea.  She is keen to have the high-dose flu shot today.    CT chest aug 2020 CLINICAL DATA:  F/u solitary pulmonary nodule No hx of sx No hx of CA Former smokerLung nodule, >=1cm   EXAM: CT CHEST WITHOUT CONTRAST   TECHNIQUE: Multidetector CT imaging of the chest was performed following the standard protocol without IV contrast.   COMPARISON:  CT July 09, 2018   FINDINGS: Cardiovascular: Coronary artery calcification and aortic atherosclerotic calcification.   Mediastinum/Nodes: No axillary or supraclavicular adenopathy. No mediastinal hilar adenopathy. No pericardial effusion. Esophagus normal.   Lungs/Pleura: In the RIGHT lower lobe nodule of concern measures 3 mm x 3 mm compared to 8 mm x 8 mm for adduction size (image 95/3) nodule in the RIGHT middle lobe measures 4 mm (image 88/3 unchanged from 5 mm. No new nodularity.   Upper Abdomen: Limited view of the liver, kidneys, pancreas are unremarkable. Normal adrenal glands.   Musculoskeletal: No aggressive osseous lesion.   IMPRESSION: 1. Decrease in size of the larger RIGHT lower lobe pulmonary nodule suggests benign etiology. 2. Stable RIGHT middle lobe pulmonary nodule at 4-5 mm. Consider follow-up CT scan in 12 month per Fleischner criteria.     Electronically Signed   By: Suzy Bouchard M.D.   On: 12/20/2018 15:19 ROS - per HPI   OV 06/20/2019  Subjective:  Patient ID: Erin Black, female , DOB: 15-Apr-1951 , age 39 y.o. , MRN: [id number] , ADDRESS: [address] [place] [address]   06/20/2019 -   Chief Complaint  Patient presents with   Follow-up    Pt states she has been doing okay since last visit.  States she is still coughing.    Known RA patient (prior mtx). Former smoker Previous MAI infection with treatment and CT scan October 2018 with improvement.  Lung nodules - improved march 2020 -> aug 2020 Has chronic sinusitis and cough neuroapathy   HPI Shaquanna Lycan 70 y.o. -returns for follow-up of the above issues.  In the interim she is visited Dr. Dennison Mascot right x2 and also done voice rehabilitation.  With this the cough is better.  RSI cough score is 11.  She asked Dr. Joya Gaskins if her sinus drainage will ever resolve and apparently he was pessimistic about it.  Nevertheless she is feeling better.  She does not have much of new symptoms  She has pulmonary nodules that require follow-up in the summer 2021 which would be a 1 year scan  She has a new question new issue about COVID-19 vaccine.  She is reluctant to take it because of bee allergy anaphylaxis history.  But she has had other vaccines without any problems.  Her husband did have COVID-19 vaccine without any issues.  We spent some time discussing this.  She feels social distancing and masking along with daily exercise and the availability of monoclonal antibody treatment might be good enough risk protectors against Covid.    OV 01/24/2020   Subjective:  Patient ID: Erin Black, female , DOB: [birth year], age 33 y.o. years. , MRN: [id number],  ADDRESS: [address] [place] [address] PCP  Ernestene Kiel, MD Providers : Treatment Team:  Attending Provider: Brand Males, MD  Known RA patient (prior mtx). Former smoker Previous MAI infection with treatment and CT scan October 2018 with improvement.  Lung nodules - improved march 2020 -> aug 2020 Has chronic sinusitis and cough neuroapathy    Chief Complaint  Patient presents with   Follow-up    cough remains the same.  discuss CT scan.       HPI Charlisha Market 70 y.o. -presents for follow-up.  In the interim she feels stable.  Cough and irritable  larynx syndrome and shortness of breath are minimal.  She continues to exercise regularly with walks.  She is lost weight because of that but this is intentional.  She has had a Covid vaccine [employer].  This gave her significant flulike symptoms and therefore she is reluctant to take the booster.  Overall she is stable.  She had a follow-up CT scan of the chest for 1 year follow-up of the lung nodules.  Her right-sided nodules are improved but she has a 7 mm new lung nodule but symptom wise she is not any different.  She will have a high-dose flu shot today.  There are no other issues.    Dr Lorenza Cambridge Reflux Symptom Index (> 13-15 suggestive of LPR cough) 0 -> 5  =  none ->severe problem 06/20/2019   Hoarseness of problem with voice 1  Clearing  Of Throat 2.5  Excess throat mucus or feeling of post nasal drip 4  Difficulty swallowing food, liquid or tablets 0  Cough after eating or lying down 0  Breathing difficulties or choking episodes 0  Troublesome or annoying cough 2.5  Sensation of something sticking in throat or lump in throat 1  Heartburn, chest pain, indigestion, or stomach acid coming up 0  TOTAL 11     CLINICAL DATA:  Abnormal x-ray.  Pulmonary nodule.   EXAM: CT CHEST WITHOUT CONTRAST   TECHNIQUE: Multidetector CT imaging of the chest was performed following the standard protocol without IV contrast.   COMPARISON:  Sep 19, 2018   FINDINGS: Cardiovascular: The heart size is normal. No substantial pericardial effusion. Coronary artery calcification is evident. Atherosclerotic calcification is noted in the wall of the thoracic aorta.   Mediastinum/Nodes: No mediastinal lymphadenopathy. No evidence for gross hilar lymphadenopathy although assessment is limited by the lack of intravenous contrast on today's study. The esophagus has normal imaging features. There is no axillary lymphadenopathy.   Lungs/Pleura: Centrilobular emphsyema noted.  Biapical pleuroparenchymal scarring evident. Subtle tree-in-bud nodularity posterior right upper lobe on 73/7 is stable. 4 mm posterior right upper lobe pulmonary nodule (97/7) is stable in the interval with adjacent tiny tree-in-bud nodules, likely sequelae of prior atypical infection. The 3 mm index nodule measured on the previous study that had decreased is similar at 2-3 mm today with interval resolution of the adjacent tiny nodule seen previously. Scarring in the base of the right middle lobe is unchanged.   There is a new cluster of nodularity in the left upper lobe with dominant nodule measuring 7 mm on image 97/7. Subtle new cluster of tiny  nodules noted posterior left upper lobe on 97/7. Stable scarring in the inferior lingula   No focal airspace consolidation.  No pleural effusion.   Upper Abdomen: Unremarkable.   Musculoskeletal: No worrisome lytic or sclerotic osseous abnormality.   IMPRESSION: 1. Waxing and waning of clustered tiny pulmonary nodules bilaterally. Right lower lobe nodule of concern shows continued further decrease in size with interval development of new clustered nodularity in the peripheral left lung today. Imaging features likely represent areas of scarring/atypical infection of varying chronicity. Dominant new nodule today is 7 mm in the left upper lobe. Non-contrast chest CT at 3-6 months is recommended. If the nodules are stable at time of repeat CT, then future CT at 18-24 months (from today's scan) is considered optional for low-risk patients, but is recommended for high-risk patients. This recommendation follows the consensus statement: Guidelines for Management of Incidental Pulmonary Nodules Detected on CT Images: From the Fleischner Society 2017; Radiology 2017; 284:228-243. 2. Other scattered tiny bilateral pulmonary nodules are stable in the interval. 3. Aortic Atherosclerosis (ICD10-I70.0) and Emphysema (ICD10-J43.9).      Electronically Signed   By: Misty Stanley M.D.   On: 01/03/2020 11:16    OV 07/26/2020  Subjective:  Patient ID: Erin Black, female , DOB: 05-10-1950 , age 9 y.o. , MRN: [id number] , ADDRESS: [address] [place] [address] PCP Ernestene Kiel, MD Patient Care Team: Ernestene Kiel, MD as PCP - General (Internal Medicine) Elsie Stain, MD as Consulting Physician (Pulmonary Disease) Richardo Priest, MD as Referring Physician (Cardiology)  This Provider for this visit: Treatment Team:  Attending Provider: Brand Males, MD    07/26/2020 -   Chief Complaint  Patient presents with   Follow-up    Doing well, review CT    Known RA patient (prior mtx). Former smoker Previous MAI infection with treatment and CT scan October 2018 with improvement.  Lung nodules - improved march 2020 -> aug 2020 Has chronic sinusitis and cough neuroapathy  HPI Rejina Odle 70 y.o. -returns for follow-up.  She is feeling well.  She had follow-up CT scan of the chest to follow-up of one single nodule that was getting worse potentially but then there are many more nodules and infiltrates are worse.  I visualized the CT scan with and compared to September 2021 definitely agree with the radiologist below.  There are no other new issues.  We discussed the possibility that the MAI infection is back.  She agrees with it.  She is surprised because she took 11 months of treatment.  We discussed in do sputum but she has tried this in the past and that did not work.    CT Chest data 07/09/20 FINDINGS: Cardiovascular: Aortic atherosclerosis. Normal heart size. Left coronary artery calcifications. No pericardial effusion.   Mediastinum/Nodes: No enlarged mediastinal, hilar, or axillary lymph nodes. Thyroid gland, trachea, and esophagus demonstrate no significant findings.   Lungs/Pleura: Mild-to-moderate centrilobular emphysema. Multiple scattered bilateral areas of clustered and  tree-in-bud centrilobular nodularity are again seen. Several areas of nodularity are new or worsened compared to prior examination, for example in the medial left upper lobe (series 5, image 61) and in both the medial and lateral segments of the right middle lobe (series 5, image 87). There is fibrotic scarring of the lingula (series 5, image 102). No pleural effusion or pneumothorax.   Upper Abdomen: No acute abnormality.   Musculoskeletal: No chest wall mass or suspicious bone lesions identified.    IMPRESSION: 1. Multiple  scattered bilateral areas of clustered and tree-in-bud centrilobular nodularity are again seen. Several areas of nodularity are new or worsened compared to prior examination, for example in the medial left upper lobe and in both the medial and lateral segments of the right middle lobe. 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 2. Emphysema. 3. Coronary artery disease.   Aortic Atherosclerosis (ICD10-I70.0) and Emphysema (ICD10-J43.9).     Electronically Signed   By: Eddie Candle M.D.   On: 07/09/2020 13:54    OV 09/17/2020  Subjective:  Patient ID: Buelah Manis, female , DOB: 03-28-1951 , age 66 y.o. , MRN: [id number] , ADDRESS: [address] [place] [address] PCP Ernestene Kiel, MD Patient Care Team: Ernestene Kiel, MD as PCP - General (Internal Medicine) Elsie Stain, MD as Consulting Physician (Pulmonary Disease) Richardo Priest, MD as Referring Physician (Cardiology)  This Provider for this visit: Treatment Team:  Attending Provider: Brand Males, MD    09/17/2020 -   Chief Complaint  Patient presents with   Follow-up    Chest xray performed today. Pt states she is doing better since last visit. Has some occ SOB and an occ cough.     HPI GRACEANNA THEISSEN 70 y.o. -returns for follow-up to discuss bronchoscopy results.  Bronchoscopy was done on  08/29/2020.  So far culture negative.  She has mixed cellularity with neutrophils and lymphocytes.  She continues to have chronic symptoms.  She is frustrated that the symptoms still continue.  She is also possible that the results are not back growing anything on culture.  Of note, in 2016 she grew MAI.  Also of note 1 day after the bronchoscopy she picked up fever chest x-ray showed right middle lobe infiltrates at the place of lavage.  She was given antibiotics.  She is better now.  She is wondering if her problems could be because of autoimmune.  Autoimmune serology was last checked in 2018 and normal.  Since then no stigmata of connective tissue disease or vasculitis.  She is willing to get this rechecked.  We discussed that if etiology is nondiagnostic after a few more weeks of waiting for the cultures then we should consider referral to [facility] for second opinion.  She is open to this idea.    Result Date: 09/17/2020 CLINICAL DATA:  Follow-up right middle lobe pneumonia EXAM: CHEST - 2 VIEW COMPARISON:  08/30/2020 04/12/2014 FINDINGS: Heart size within normal limits. No pulmonary vascular congestion. Lungs are hyperexpanded. Interval improvement in aeration of the right middle lobe. Some opacity still remains indicative of resolving pneumonia. IMPRESSION: Interval improvement of right middle lobe airspace opacity indicative of resolving pneumonia. Continued follow-up to complete resolution recommended. Electronically Signed   By: Miachel Roux M.D.   On: 09/17/2020 10:04   Results for TANGY, DROZDOWSKI (MRN [id number]) as of 10/18/2020 15:10  Ref. Range 08/29/2020 13:52  Monocyte-Macrophage-Serous Fluid Latest Ref Range: 50 - 90 % 60  Other Cells, Fluid Latest Units: % CORRELATE WITH CYTOLOGY.  Fluid Type-FCT Unknown BRONCHIAL ALVEOLAR LAVAGE  Color, Fluid Latest Ref Range: YELLOW  COLORLESS (A)  Total Nucleated Cell Count, Fluid Latest Ref Range: 0 - 1,000 cu mm 46  Lymphs, Fluid Latest Units:  % 28  Appearance, Fluid Latest Ref Range: CLEAR  CLEAR (A)  Neutrophil Count, Fluid Latest Ref Range: 0 - 25 % 12     OV 10/18/2020  Subjective:  Patient ID: Buelah Manis, female , DOB:  05-03-1950 , age 58 y.o. , MRN: [id number] , ADDRESS: [address] [place] [address] PCP Ernestene Kiel, MD Patient Care Team: Ernestene Kiel, MD as PCP - General (Internal Medicine) Elsie Stain, MD as Consulting Physician (Pulmonary Disease) Richardo Priest, MD as Referring Physician (Cardiology)  This Provider for this visit: Treatment Team:  Attending Provider: Brand Males, MD  Type of visit: Telephone/Video Circumstance: COVID-19 national emergency Identification of patient CAROLENE GITTO with 1950/11/30 and MRN [id number] - 2 person identifier Risks: Risks, benefits, limitations of telephone visit explained. Patient understood and verbalized agreement to proceed Anyone else on call: Patient Patient location: [phone] This provider location [place]   10/18/2020 -of follow-up follow-up to discuss bronchoscopy results lavage right middle lobe from 08/29/2020   HPI CARIANA KARGE 70 y.o. -he is doing stable but still continues have significant cough and runny nose and she is frustrated.  Since her last visit we did a repeat serology profile and it is negative.  She says she has history of rheumatoid arthritis and took methotrexate with Dr. Ouida Sills.  She does not recollect if her RA factor was positive or negative in the past.  Bronchoscopy 08/29/2020 shows mixed cellularity.  Her cultures are negative including AFB and fungus.  Her cytology is negative for malignant cells.  I shared this result with her.  Her QuantiFERON gold is also negative.  I shared all these results with her.  She is frustrated that there is no etiology.  We discussed second opinion and she is open to this idea.  However she wants me to touch base with Dr. Asencion Noble previous  pulmonologist who is now in primary care.  She wants to make sure all bases are covered.  And based on that she will consider referral.  We discussed [facility] or [place] as potential places.  Other option is to get a CT scan in September or October 2022.  Did indicate if these nodules are growing neck step is for biopsy and the fact that we did the bronchoscopy with lavage was to avoid a biopsy.  Results for CAROLJEAN, MONSIVAIS (MRN [id number]) as of 10/18/2020 15:10  Ref. Range 09/17/2020 10:14  Anti Nuclear Antibody (ANA) Latest Ref Range: NEGATIVE  NEGATIVE  Cyclic Citrullin Peptide Ab Latest Units: UNITS <16  GBM Ab Latest Units: AI <1.0  Myeloperoxidase Abs Latest Units: AI <1.0  Serine Protease 3 Latest Units: AI <1.0  RA Latex Turbid. Latest Ref Range: <14 IU/mL <14   Results for MONTOYA, WATKIN (MRN [id number]) as of 10/18/2020 15:10  Ref. 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), non-ST elevation myocardial infarction (NSTEMI) (04/11/2014), Hypotension, 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), Takotsubo cardiomyopathy, and  UTI (lower urinary tract infection).   reports that she quit smoking about 22 years ago. Her smoking use included cigarettes. She has a 25.00 pack-year smoking history. She has never used smokeless tobacco.  Past Surgical History:  Procedure Laterality Date   bilateral tubal ligation     BRONCHIAL WASHINGS  08/29/2020   Procedure: BRONCHIAL WASHINGS;  Surgeon: Brand Males, MD;  Location: [facility];  Service: Endoscopy;;   CARDIAC CATHETERIZATION     LUMBAR SPINE SURGERY     strabismus repair     TONSILLECTOMY AND ADENOIDECTOMY     VIDEO BRONCHOSCOPY Bilateral 05/17/2014   Procedure: VIDEO BRONCHOSCOPY WITHOUT FLUORO;  Surgeon: Elsie Stain, MD;  Location: [facility];  Service: Cardiopulmonary;  Laterality: Bilateral;   VIDEO BRONCHOSCOPY N/A 08/29/2020   Procedure: VIDEO BRONCHOSCOPY WITHOUT FLUORO;  Surgeon: Brand Males, MD;  Location: [facility];  Service: Endoscopy;  Laterality: N/A;    Allergies  Allergen Reactions   Yellow Jacket Venom [Bee Venom] Anaphylaxis    Hives/swelling   Omnicef [Cefdinir]     rash   Plavix [Clopidogrel Bisulfate]     rash    Immunization History  Administered Date(s) Administered   Fluad Quad(high Dose 65+) 12/28/2018, 01/24/2020   Influenza, High Dose Seasonal PF 01/22/2016, 01/12/2018   Influenza-Unspecified 01/26/2014, 01/16/2015, 02/09/2017   Janssen (J&J) SARS-COV-2 Vaccination 08/06/2019   Pneumococcal Conjugate-13 02/19/2015    Family History  Problem Relation Age of Onset   Emphysema Mother    Heart disease Mother    Lung cancer Mother    Prostate cancer Father    Lung cancer Sister      Current Outpatient Medications:    aspirin 81 MG tablet, Take 81 mg by mouth at bedtime. Every other day per pcp, Disp: , Rfl:    atorvastatin (LIPITOR) 80 MG tablet,  Take 40 mg by mouth at bedtime., Disp: , Rfl:    clonazePAM (KLONOPIN) 0.5 MG tablet, Take 0.5 mg by mouth daily., Disp: , Rfl:    EPINEPHrine 0.3 mg/0.3 mL IJ SOAJ injection, Inject 0.3 mg into the muscle as needed. , Disp: , Rfl:    ipratropium (ATROVENT) 0.06 % nasal spray, Place 2 sprays into both nostrils in the morning and at bedtime., Disp: , Rfl: 5   loratadine (CLARITIN) 10 MG tablet, Take 10 mg by mouth daily., Disp: , Rfl:    metoprolol tartrate (LOPRESSOR) 25 MG tablet, Take 1 tablet (25 mg total) by mouth at bedtime. (BETA BLOCKER) (Patient taking differently: Take 12.5 mg by mouth at bedtime. Taking 1/2 tablet  (BETA BLOCKER)), Disp: 90 tablet, Rfl: 3   nitroGLYCERIN (NITROSTAT) 0.4 MG SL tablet, Place 0.4 mg under the tongue every 5 (five) minutes as needed for chest pain., Disp: , Rfl:    tiZANidine (ZANAFLEX) 4 MG tablet, Take 4 mg by mouth at bedtime., Disp: , Rfl:       Objective:   There were no vitals filed for this visit.  Estimated body mass index is 14.66 kg/m as calculated from the following:   Height as of 09/17/20: _0  (1.626 m).   Weight as of 09/17/20: 85 lb 6.4 oz (38.7 kg).  _1 @  There were no vitals filed for this visit.   Physical Exam Sounded noorml on phone    Assessment:       ICD-10-CM   1. Multiple lung nodules on CT  R91.8          Plan:     Patient Instructions  ICD-10-CM   1. Multiple lung nodules on CT  R91.8   2. History of MAI infection  Z86.19   3. 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   - autoimmune seriology 2018 was negative  - right side nodules on CT March 2022 is worse compared to  Sept 2021 raising concern  MAI infection is back  - Bronchoscopy 08/29/20 - so far no growth on culture  - Too bad you are still symptomatic  Plan  - do blood ANA, RF, CCP, GBM, MPO, PR-3, ANCA antibody - do blood quantiferon gold test  - await final culture results for AFB and MAC by mid-June 2022  Followup  -  Telephone visit with Dr Chase Caller or NP - Mid-End June 2022   - if workup negative/non-diagnostic, then consider referral for 2nd opinion to [place]  (Telephone visit - Level 03 visit: Estb 21-30 for this visit type which was visit type: telephone visit in total care time and counseling or/and coordination of care by this undersigned MD - Dr Brand Males. This includes one or more of the following for care delivered on 10/18/2020 same day: pre-charting, chart review, note writing, documentation discussion of test results, diagnostic or treatment recommendations, prognosis, risks and benefits of management options, instructions, education, compliance or risk-factor reduction. It excludes time spent by the [facility] or office staff in the care of the patient. Actual time was 25 min. E&M code is [phone])   SIGNATURE    Dr. Brand Males, M.D., F.C.C.P,  Pulmonary and Critical Care Medicine Staff Physician, [place] Director - Interstitial Lung Disease  Program  P[facility] [place] [facility] [place], [place], [address]  Pager: [phone], If no answer or between  15:00h - 7:00h: call [phone] Telephone: [phone]  3:28 PM 10/18/2020

## 2020-10-18 NOTE — Patient Instructions (Signed)
ICD-10-CM   1. Multiple lung nodules on CT  R91.8   2. History of MAI infection  Z86.19   3. 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   - autoimmune seriology 2018 was negative  - right side nodules on CT March 2022 is worse compared to  Sept 2021 raising concern MAI infection is back  - Bronchoscopy 08/29/20 - so far no growth on culture  - Too bad you are still symptomatic  Plan  - do blood ANA, RF, CCP, GBM, MPO, PR-3, ANCA antibody - do blood quantiferon gold test  - await final culture results for AFB and MAC by mid-June 2022  Followup  -  Telephone visit with Dr Chase Caller or NP - Mid-End June 2022   - if workup negative/non-diagnostic, then consider referral for 2nd opinion to [facility]

## 2020-10-24 ENCOUNTER — Telehealth: Payer: Self-pay | Admitting: Internal Medicine

## 2020-10-24 DIAGNOSIS — Z8619 Personal history of other infectious and parasitic diseases: Secondary | ICD-10-CM

## 2020-10-24 DIAGNOSIS — R053 Chronic cough: Secondary | ICD-10-CM

## 2020-10-24 NOTE — Telephone Encounter (Signed)
I sent secure chat to Dr Delford Field same day of visit but did not hear back. I can contact him again but I think is best for patient to get another opinion at [facility]. LMK what she says  Thanks  MR

## 2020-10-24 NOTE — Telephone Encounter (Signed)
Called and spoke with patient. She states she was seen on 6/23 via televisit. Patient is wanting to know if records were sent to Dr. Delford Field. She wanted to know if he would look at them or does she need to get referred to [facility] or [facility].   Note from visit:  in the OV 6/23, he said for her to have a Telephone visit with Dr Marchelle Gearing or NP - Mid-End June 2022             - if workup negative/non-diagnostic, then consider referral for 2nd opinion to [facility]  There is no availability for you or the APP's until 7/22.     MR please advise what needs to be done.   Thank you

## 2020-10-25 NOTE — Telephone Encounter (Signed)
Left message for patient to call back  

## 2020-10-25 NOTE — Telephone Encounter (Signed)
Patient is returning phone call. Patient phone number is [phone].

## 2020-10-25 NOTE — Telephone Encounter (Signed)
I spoke with patient regarding MR message. She is good with being referred to either [facility], as long as she gets answers. Sending to MR  Dr. Marchelle Gearing, please advise. thanks

## 2020-10-26 NOTE — Telephone Encounter (Signed)
Yes plase refer to [facility]- cough, hx of MAI, pulmonary nodules. Put all 3 doc names with whoever first available

## 2020-10-26 NOTE — Telephone Encounter (Signed)
Spoke with the pt She would like referral for [facility]  Would like to be referred to [facility], [facility], or [facility] Please advise if okay to place, thanks

## 2020-10-26 NOTE — Telephone Encounter (Signed)
Refer to [facility] has been placed for pt specifying all 3 of the providers listed. Attempted to call pt to let her know this had been done but unable to reach. Left detailed message for pt letting her know that this had been done. Nothing further needed.

## 2021-02-11 DIAGNOSIS — R918 Other nonspecific abnormal finding of lung field: Secondary | ICD-10-CM | POA: Diagnosis not present

## 2021-02-11 DIAGNOSIS — R053 Chronic cough: Secondary | ICD-10-CM | POA: Diagnosis not present

## 2021-02-11 DIAGNOSIS — Z681 Body mass index (BMI) 19 or less, adult: Secondary | ICD-10-CM | POA: Diagnosis not present

## 2021-02-13 DIAGNOSIS — Z23 Encounter for immunization: Secondary | ICD-10-CM | POA: Diagnosis not present

## 2021-02-17 DIAGNOSIS — R918 Other nonspecific abnormal finding of lung field: Secondary | ICD-10-CM | POA: Diagnosis not present

## 2021-02-17 DIAGNOSIS — J479 Bronchiectasis, uncomplicated: Secondary | ICD-10-CM | POA: Diagnosis not present

## 2021-02-18 DIAGNOSIS — R053 Chronic cough: Secondary | ICD-10-CM | POA: Diagnosis not present

## 2021-02-18 DIAGNOSIS — Z7189 Other specified counseling: Secondary | ICD-10-CM | POA: Diagnosis not present

## 2021-02-18 DIAGNOSIS — Z1331 Encounter for screening for depression: Secondary | ICD-10-CM | POA: Diagnosis not present

## 2021-02-18 DIAGNOSIS — E785 Hyperlipidemia, unspecified: Secondary | ICD-10-CM | POA: Diagnosis not present

## 2021-02-18 DIAGNOSIS — Z1231 Encounter for screening mammogram for malignant neoplasm of breast: Secondary | ICD-10-CM | POA: Diagnosis not present

## 2021-02-18 DIAGNOSIS — Z79899 Other long term (current) drug therapy: Secondary | ICD-10-CM | POA: Diagnosis not present

## 2021-02-18 DIAGNOSIS — Z0000 Encounter for general adult medical examination without abnormal findings: Secondary | ICD-10-CM | POA: Diagnosis not present

## 2021-03-26 DIAGNOSIS — J479 Bronchiectasis, uncomplicated: Secondary | ICD-10-CM | POA: Diagnosis not present

## 2021-03-26 DIAGNOSIS — R918 Other nonspecific abnormal finding of lung field: Secondary | ICD-10-CM | POA: Diagnosis not present

## 2021-03-26 DIAGNOSIS — J432 Centrilobular emphysema: Secondary | ICD-10-CM | POA: Diagnosis not present

## 2021-04-09 DIAGNOSIS — Z1231 Encounter for screening mammogram for malignant neoplasm of breast: Secondary | ICD-10-CM | POA: Diagnosis not present

## 2021-04-10 ENCOUNTER — Other Ambulatory Visit: Payer: Self-pay

## 2021-04-10 MED ORDER — METOPROLOL TARTRATE 25 MG PO TABS: 25.0000 mg | ORAL_TABLET | Freq: Every day | ORAL | 3 refills | Status: DC

## 2021-04-10 NOTE — Telephone Encounter (Signed)
Metoprolol tartrate 25 mg # 90 tablets x 3 refills sent to [facility] [place]

## 2021-05-01 DIAGNOSIS — J101 Influenza due to other identified influenza virus with other respiratory manifestations: Secondary | ICD-10-CM | POA: Diagnosis not present

## 2021-05-01 DIAGNOSIS — Z20822 Contact with and (suspected) exposure to covid-19: Secondary | ICD-10-CM | POA: Diagnosis not present

## 2021-05-23 DIAGNOSIS — R946 Abnormal results of thyroid function studies: Secondary | ICD-10-CM | POA: Diagnosis not present

## 2021-07-08 IMAGING — CT CT CHEST WITHOUT CONTRAST
2 of 3 series · 15 of 36 positions shown, 18 images · non-contrast
Comparison: CT July 09, 2018

CLINICAL DATA: F/u solitary pulmonary nodule No hx of sx No hx of
CA Former smokerLung nodule, >=1cm

EXAM:
CT CHEST WITHOUT CONTRAST
TECHNIQUE: Multidetector CT imaging of the chest was performed following the
standard protocol without IV contrast.

[Series 2: thorax · axial · 0.60mm/px · z∈[-263,-5]mm · 12 of 153 slices shown, 15 images]
[im 12/153  mediastinal]
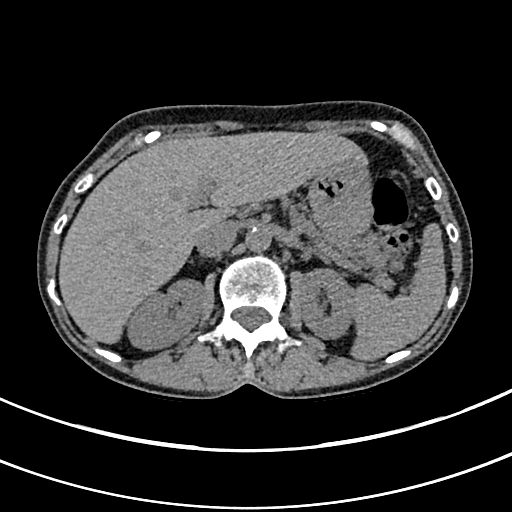
[im 12/153  lung]
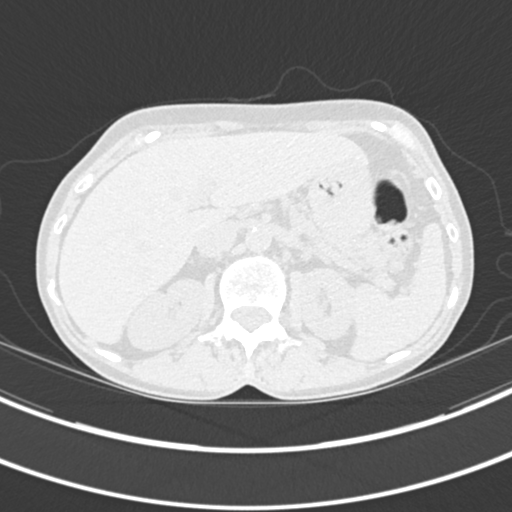
[im 23/153  lung]
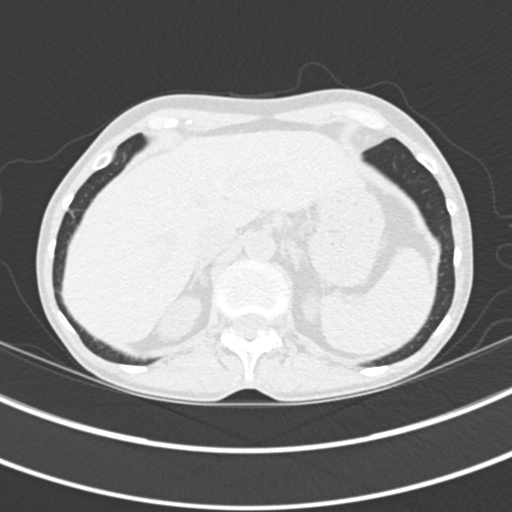
[im 34/153  lung]
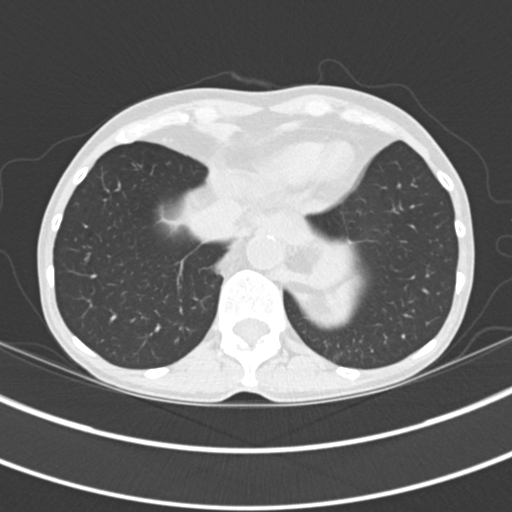
[im 46/153  lung]
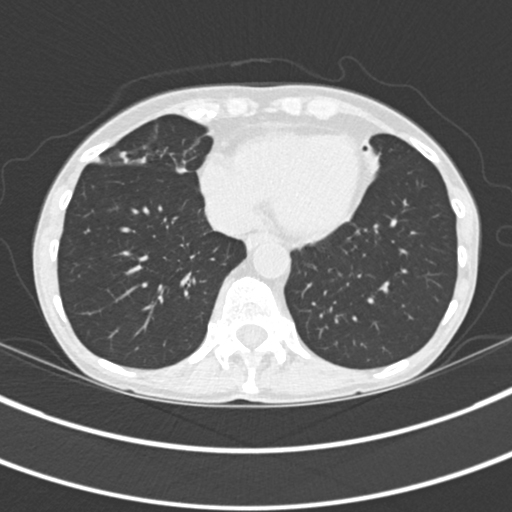
[im 57/153  mediastinal]
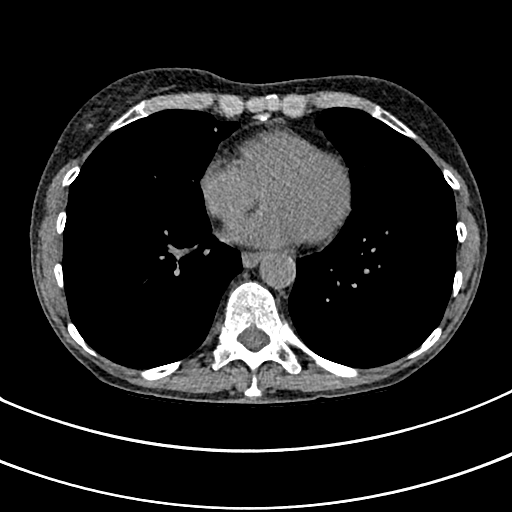
[im 57/153  lung]
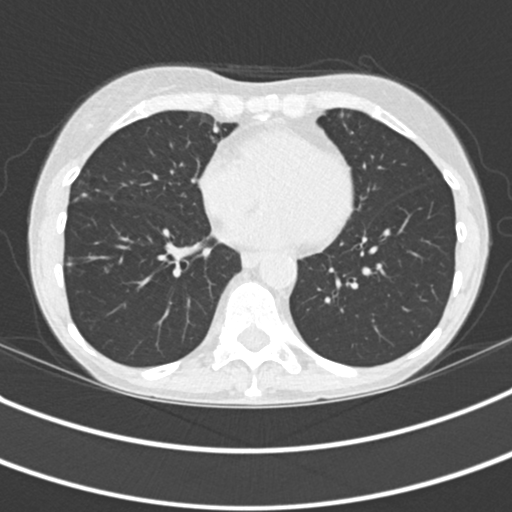
[im 68/153  lung]
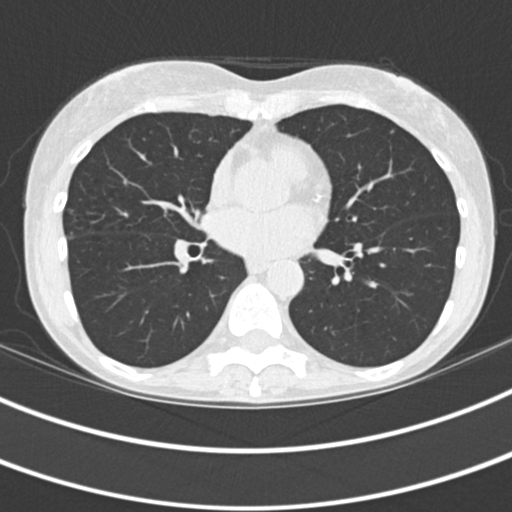
[im 85/153  lung]
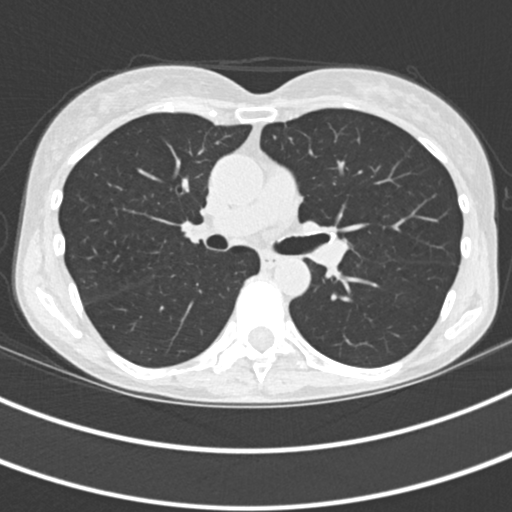
[im 96/153  lung]
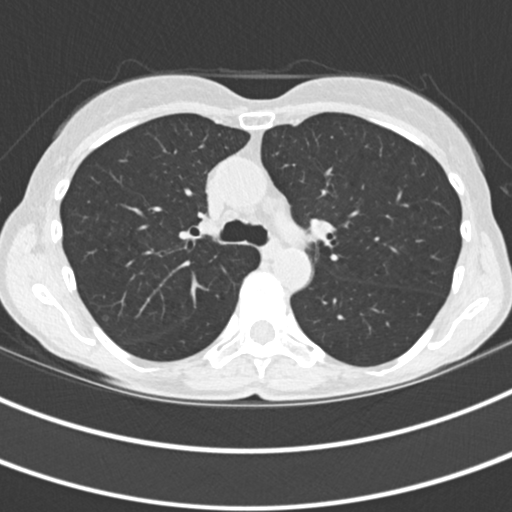
[im 107/153  mediastinal]
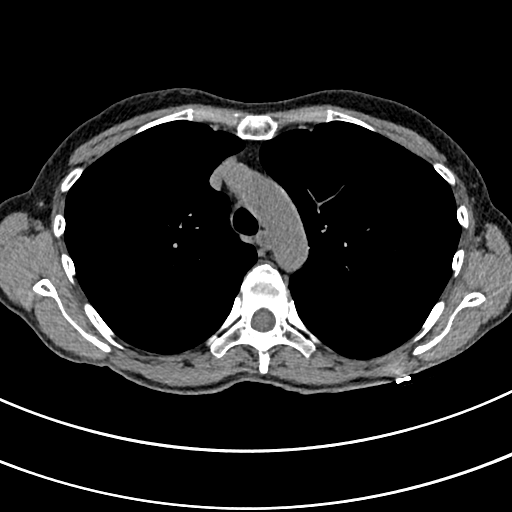
[im 107/153  lung]
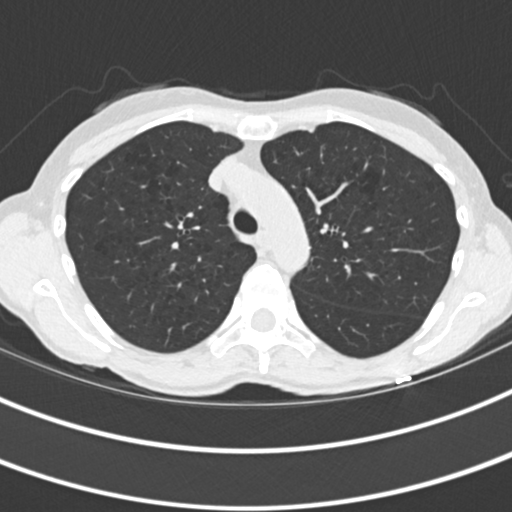
[im 119/153  lung]
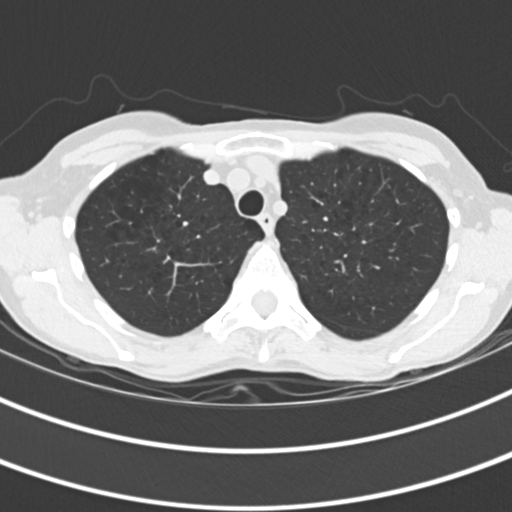
[im 130/153  lung]
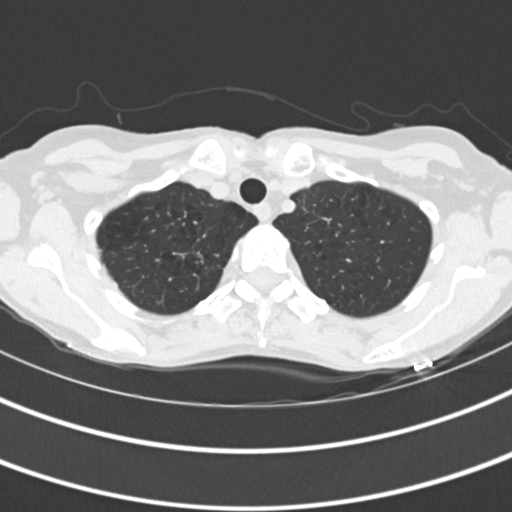
[im 141/153  lung]
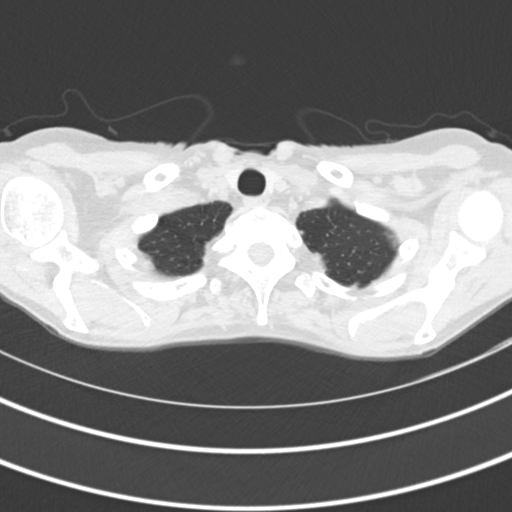

[Series 5: coronal · coronal · 0.57mm/px · 3 of 107 slices shown]
[im 22/107  lung]
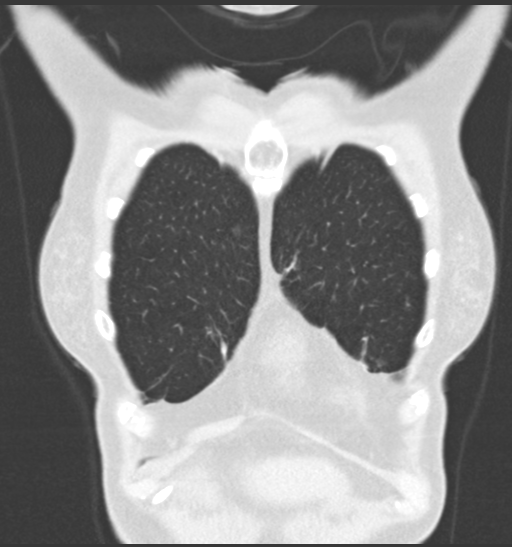
[im 43/107  lung]
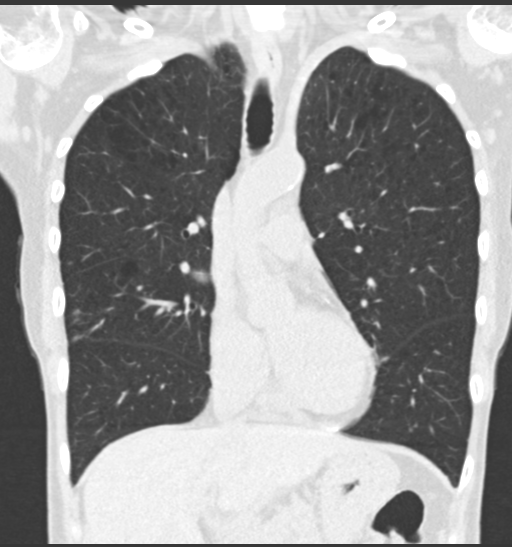
[im 64/107  lung]
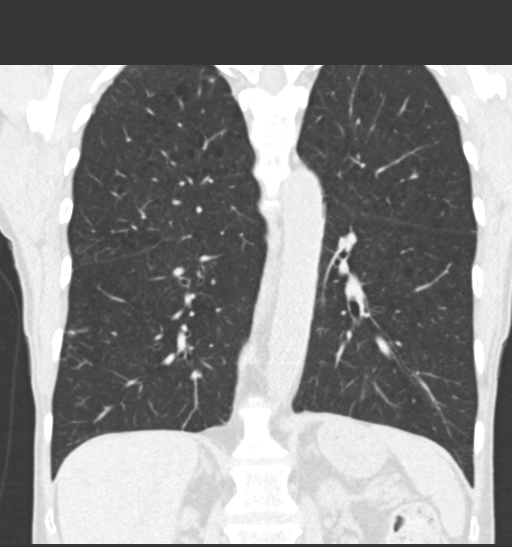

[15 of 36 positions shown; findings below may reference images not displayed]

FINDINGS: Cardiovascular: Coronary artery calcification and aortic
atherosclerotic calcification.

Mediastinum/Nodes: No axillary or supraclavicular adenopathy. No
mediastinal hilar adenopathy. No pericardial effusion. Esophagus
normal.

Lungs/Pleura: In the RIGHT lower lobe nodule of concern measures 3
mm x 3 mm compared to 8 mm x 8 mm for adduction size (image 95/3)
nodule in the RIGHT middle lobe measures 4 mm (image 88/3 unchanged
from 5 mm. No new nodularity.

Upper Abdomen: Limited view of the liver, kidneys, pancreas are
unremarkable. Normal adrenal glands.

Musculoskeletal: No aggressive osseous lesion.
IMPRESSION: 1. Decrease in size of the larger RIGHT lower lobe pulmonary nodule
suggests benign etiology.
2. Stable RIGHT middle lobe pulmonary nodule at 4-5 mm. Consider
follow-up CT scan in 12 month per Fleischner criteria.

## 2021-08-06 ENCOUNTER — Encounter: Payer: Self-pay | Admitting: Cardiology

## 2021-08-06 ENCOUNTER — Ambulatory Visit (INDEPENDENT_AMBULATORY_CARE_PROVIDER_SITE_OTHER): Payer: PPO | Admitting: Cardiology

## 2021-08-06 VITALS — BP 104/64 | HR 68 | Ht 64.0 in | Wt 93.2 lb

## 2021-08-06 DIAGNOSIS — I951 Orthostatic hypotension: Secondary | ICD-10-CM

## 2021-08-06 DIAGNOSIS — E782 Mixed hyperlipidemia: Secondary | ICD-10-CM

## 2021-08-06 DIAGNOSIS — I493 Ventricular premature depolarization: Secondary | ICD-10-CM

## 2021-08-06 DIAGNOSIS — I10 Essential (primary) hypertension: Secondary | ICD-10-CM | POA: Diagnosis not present

## 2021-08-06 DIAGNOSIS — I201 Angina pectoris with documented spasm: Secondary | ICD-10-CM | POA: Diagnosis not present

## 2021-08-06 NOTE — Patient Instructions (Signed)

## 2021-08-06 NOTE — Progress Notes (Signed)
?Cardiology Office Note:   ? ?Date:  08/06/2021  ? ?ID:  Erin Black, DOB December 03, 1950, MRN [id number] ? ?PCP:  Ernestene Kiel, MD  ?Cardiologist:  Shirlee More, MD   ? ?Referring MD: Ernestene Kiel, MD  ? ? ?ASSESSMENT:   ? ?1. Coronary vasospasm (HCC)   ?2. Frequent PVCs   ?3. Essential hypertension   ?4. Hypotension, postural   ?5. Mixed hyperlipidemia   ? ?PLAN:   ? ?In order of problems listed above: ? ?Despite her chronic lung disease she is really doing very well no recurrent symptoms from her coronary vasospasm and has little or no palpitation at this time.  She will continue her low-dose beta-blocker aspirin and statin I do not think she requires cardiac imaging at this time ?Stable blood pressure continue low-dose beta-blocker ?Stable no recurrent symptomatic hypotension ?Lipids are at target continue her statin ? ? ?Next appointment: 1 year ? ? ?Medication Adjustments/Labs and Tests Ordered: ?Current medicines are reviewed at length with the patient today.  Concerns regarding medicines are outlined above.  ?No orders of the defined types were placed in this encounter. ? ?No orders of the defined types were placed in this encounter. ? ? ?Follow-up for coronary vasospasm ? ? ?History of Present Illness:   ? ?Erin Black is a 72 y.o. 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 ?Compliance with diet, lifestyle and medications: Yes ? ?Overall she is doing well she is not having severe shortness of breath rare palpitations no chest pain or syncope ?She much more active is engaged in Pilates and yoga ?She is pleased with the quality of her life ?She tolerates her statin without muscle pain or weakness ?Lipid profile 02/18/2021 showed cholesterol 151 LDL 70  hemoglobin 13.1 creatinine 0.72 potassium 4.0 ?Past Medical History:  ?Diagnosis Date  ? Anxiety disorder   ? 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 Overview:  a follow-up echocardiogram which showed complete resolution of LV dysfunction consistent with Takotsubo syndrome   ? Bronchiectasis without complication ([place])   ? Chronic cough 06/20/2015  ? Congestive heart failure ([place])   ? DJD (degenerative joint disease)   ? Elevated IgE level 08/11/2016  ? Episode of syncope 10/31/2014  ? Essential hypertension   ? Hearing loss   ? History of MAI infection 02/21/2016  ? Hx of non-ST elevation myocardial infarction (NSTEMI) 04/11/2014  ? Hypotension, 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  ? 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 ; Pos FOB cultures 04/11/2014  ? 02/2014 CT Chest : RML and RLL nodular infiltrates probable inflammatory, Hx of RA on immunosuppression  ??MAI vs other pathogen 05/2014:  Fob Positive MAI  2/18/2016LFTs: normal Start date February 2016 with plan end date November 2016 of Myambutol, rifampin, azithromycin 3 times weekly 7/28/2016LFTs Normal    ? NSTEMI (non-ST elevated myocardial infarction) [facility]) Nov 2015  ? Osteopenia   ? Other acute recurrent sinusitis 05/13/2016  ? Post-nasal drip 06/20/2015  ? Pulmonary emphysema ([place]) 05/13/2016  ? PVC's (premature ventricular contractions)   ? RA (rheumatoid arthritis) ([place])   ? Rheumatoid arthritis involving multiple sites [facility])   ? Takotsubo cardiomyopathy   ? UTI (lower urinary tract infection)   ? ? ?Past Surgical History:  ?Procedure Laterality Date  ? bilateral  tubal ligation    ? BRONCHIAL WASHINGS  08/29/2020  ? Procedure: BRONCHIAL WASHINGS;  Surgeon: Brand Males, MD;  Location: [facility];  Service: Endoscopy;;  ? CARDIAC CATHETERIZATION    ? LUMBAR SPINE SURGERY    ? strabismus repair    ? TONSILLECTOMY AND ADENOIDECTOMY    ? VIDEO BRONCHOSCOPY Bilateral 05/17/2014  ?  Procedure: VIDEO BRONCHOSCOPY WITHOUT FLUORO;  Surgeon: Elsie Stain, MD;  Location: [facility];  Service: Cardiopulmonary;  Laterality: Bilateral;  ? VIDEO BRONCHOSCOPY N/A 08/29/2020  ? Procedure: VIDEO BRONCHOSCOPY WITHOUT FLUORO;  Surgeon: Brand Males, MD;  Location: [facility];  Service: Endoscopy;  Laterality: N/A;  ? ? ?Current Medications: ?Current Meds  ?Medication Sig  ? aspirin 81 MG tablet Take 81 mg by mouth at bedtime. Every other day per pcp  ? atorvastatin (LIPITOR) 80 MG tablet Take 40 mg by mouth at bedtime.  ? clonazePAM (KLONOPIN) 0.5 MG tablet Take 0.5 mg by mouth daily.  ? EPINEPHrine 0.3 mg/0.3 mL IJ SOAJ injection Inject 0.3 mg into the muscle as needed.   ? ipratropium (ATROVENT) 0.06 % nasal spray Place 2 sprays into both nostrils in the morning and at bedtime.  ? metoprolol tartrate (LOPRESSOR) 25 MG tablet Take 1 tablet (25 mg total) by mouth at bedtime. (BETA BLOCKER) (Patient taking differently: Take 12.5 mg by mouth at bedtime. Take 0.5 tablet  (12.5 mg) everyday at bedtime)  ? nitroGLYCERIN (NITROSTAT) 0.4 MG SL tablet Place 0.4 mg under the tongue every 5 (five) minutes as needed for chest pain.  ? tiZANidine (ZANAFLEX) 4 MG tablet Take 4 mg by mouth at bedtime as needed for muscle spasms.  ?  ? ?Allergies:   Bee venom, Omnicef [cefdinir], and Plavix [clopidogrel bisulfate]  ? ?Social History  ? ?Socioeconomic History  ? Marital status: Married  ?  Spouse name: Not on file  ? Number of children: Not on file  ? Years of education: Not on file  ? Highest education level: Not on file  ?Occupational History  ? Occupation: Therapist, sports  ?  Employer: [employer]  ?Tobacco Use  ? Smoking status: Former  ?  Packs/day: 1.00  ?  Years: 25.00  ?  Pack years: 25.00  ?  Types: Cigarettes  ?  Quit date: 04/28/1998  ?  Years since quitting: 23.2  ? Smokeless tobacco: Never  ?Vaping Use  ? Vaping Use: Never used  ?Substance and Sexual Activity  ? Alcohol use: Yes  ?  Alcohol/week: 0.0 standard  drinks  ?  Comment: 1 drink weekly  ? Drug use: No  ? Sexual activity: Not on file  ?Other Topics Concern  ? Not on file  ?Social History Narrative  ? Not on file  ? ?Social Determinants of Health  ? ?Financial Resource Strain: Not on file  ?Food Insecurity: Not on file  ?Transportation Needs: Not on file  ?Physical Activity: Not on file  ?Stress: Not on file  ?Social Connections: Not on file  ?  ? ?Family History: ?The patient's family history includes Emphysema in her mother; Heart disease in her mother; Lung cancer in her mother and sister; Prostate cancer in her father. ?ROS:   ?Please see the history of present illness.    ?All other systems reviewed and are negative. ? ?EKGs/Labs/Other Studies Reviewed:   ? ?The following studies were reviewed today: ? ?EKG:  EKG ordered today and personally reviewed.  The ekg ordered today demonstrates sinus rhythm normal EKG ? ?Recent Labs: ?08/16/2020:  ALT 19; BUN 11; Creatinine, Ser 0.72; Hemoglobin 12.8; Platelets 199.0; Potassium 4.0; Sodium 136  ?Recent Lipid Panel ?No results found for: CHOL, TRIG, HDL, CHOLHDL, VLDL, LDLCALC, LDLDIRECT ? ?Physical Exam:   ? ?VS:  BP 104/64 (BP Location: Right Arm)   Pulse 68   Ht _0  (1.626 m)   Wt 93 lb 3.2 oz (42.3 kg)   SpO2 99%   BMI 16.00 kg/m?    ? ?Wt Readings from Last 3 Encounters:  ?08/06/21 93 lb 3.2 oz (42.3 kg)  ?09/17/20 85 lb 6.4 oz (38.7 kg)  ?08/30/20 88 lb 9.6 oz (40.2 kg)  ?  ? ?GEN:  Well nourished, well developed in no acute distress ?HEENT: Normal ?NECK: No JVD; No carotid bruits ?LYMPHATICS: No lymphadenopathy ?CARDIAC: RRR, no murmurs, rubs, gallops ?RESPIRATORY:  Clear to auscultation without rales, wheezing or rhonchi  ?ABDOMEN: Soft, non-tender, non-distended ?MUSCULOSKELETAL:  No edema; No deformity  ?SKIN: Warm and dry ?NEUROLOGIC:  Alert and oriented x 3 ?PSYCHIATRIC:  Normal affect  ? ? ?Signed, ?Shirlee More, MD  ?08/06/2021 2:44 PM    ?[place]  ?

## 2022-03-26 DIAGNOSIS — H26493 Other secondary cataract, bilateral: Secondary | ICD-10-CM | POA: Diagnosis not present

## 2022-03-26 DIAGNOSIS — H26492 Other secondary cataract, left eye: Secondary | ICD-10-CM | POA: Diagnosis not present

## 2022-03-30 DIAGNOSIS — U071 COVID-19: Secondary | ICD-10-CM | POA: Diagnosis not present

## 2022-03-30 DIAGNOSIS — Z20822 Contact with and (suspected) exposure to covid-19: Secondary | ICD-10-CM | POA: Diagnosis not present

## 2022-05-28 DIAGNOSIS — A310 Pulmonary mycobacterial infection: Secondary | ICD-10-CM | POA: Diagnosis not present

## 2022-05-28 DIAGNOSIS — M542 Cervicalgia: Secondary | ICD-10-CM | POA: Diagnosis not present

## 2022-05-28 DIAGNOSIS — Z681 Body mass index (BMI) 19 or less, adult: Secondary | ICD-10-CM | POA: Diagnosis not present

## 2022-05-28 DIAGNOSIS — Z1331 Encounter for screening for depression: Secondary | ICD-10-CM | POA: Diagnosis not present

## 2022-05-28 DIAGNOSIS — E785 Hyperlipidemia, unspecified: Secondary | ICD-10-CM | POA: Diagnosis not present

## 2022-05-28 DIAGNOSIS — Z1231 Encounter for screening mammogram for malignant neoplasm of breast: Secondary | ICD-10-CM | POA: Diagnosis not present

## 2022-05-28 DIAGNOSIS — E2839 Other primary ovarian failure: Secondary | ICD-10-CM | POA: Diagnosis not present

## 2022-05-28 DIAGNOSIS — Z0000 Encounter for general adult medical examination without abnormal findings: Secondary | ICD-10-CM | POA: Diagnosis not present

## 2022-05-28 DIAGNOSIS — Z79899 Other long term (current) drug therapy: Secondary | ICD-10-CM | POA: Diagnosis not present

## 2022-05-28 DIAGNOSIS — R946 Abnormal results of thyroid function studies: Secondary | ICD-10-CM | POA: Diagnosis not present

## 2022-05-28 DIAGNOSIS — I700 Atherosclerosis of aorta: Secondary | ICD-10-CM | POA: Diagnosis not present

## 2022-05-28 DIAGNOSIS — F5101 Primary insomnia: Secondary | ICD-10-CM | POA: Diagnosis not present

## 2022-06-12 DIAGNOSIS — Z1231 Encounter for screening mammogram for malignant neoplasm of breast: Secondary | ICD-10-CM | POA: Diagnosis not present

## 2022-06-12 DIAGNOSIS — E2839 Other primary ovarian failure: Secondary | ICD-10-CM | POA: Diagnosis not present

## 2022-06-12 DIAGNOSIS — M85852 Other specified disorders of bone density and structure, left thigh: Secondary | ICD-10-CM | POA: Diagnosis not present

## 2022-07-17 DIAGNOSIS — Z87891 Personal history of nicotine dependence: Secondary | ICD-10-CM | POA: Diagnosis not present

## 2022-07-17 DIAGNOSIS — J479 Bronchiectasis, uncomplicated: Secondary | ICD-10-CM | POA: Diagnosis not present

## 2022-08-06 DIAGNOSIS — I2510 Atherosclerotic heart disease of native coronary artery without angina pectoris: Secondary | ICD-10-CM | POA: Diagnosis not present

## 2022-08-06 DIAGNOSIS — J479 Bronchiectasis, uncomplicated: Secondary | ICD-10-CM | POA: Diagnosis not present

## 2022-08-06 DIAGNOSIS — I700 Atherosclerosis of aorta: Secondary | ICD-10-CM | POA: Diagnosis not present

## 2022-08-06 DIAGNOSIS — R918 Other nonspecific abnormal finding of lung field: Secondary | ICD-10-CM | POA: Diagnosis not present

## 2022-08-06 DIAGNOSIS — J439 Emphysema, unspecified: Secondary | ICD-10-CM | POA: Diagnosis not present

## 2022-08-12 DIAGNOSIS — J479 Bronchiectasis, uncomplicated: Secondary | ICD-10-CM | POA: Diagnosis not present

## 2022-08-20 NOTE — Progress Notes (Unsigned)
Cardiology Office Note:    Date:  08/21/2022   ID:  Erin Black, DOB 07-29-1950, MRN [id number]  PCP:  Philemon Kingdom, MD  Cardiologist:  Norman Herrlich, MD    Referring MD: Philemon Kingdom, MD    ASSESSMENT:    1. Coronary vasospasm   2. Essential hypertension   3. Hypotension, postural   4. Mixed hyperlipidemia   5. Frequent PVCs    PLAN:    In order of problems listed above:  No further episodes of angina exceptionally well I think she could take her beta-blocker as needed I do think she should remain on aspirin and lipid-lowering therapy Does not require antihypertensive treatment at this time Resolved Continue her statin with history of previous myocardial infarction Asymptomatic I think in the future should do best with beta-blocker as needed   Next appointment: 1 year   Medication Adjustments/Labs and Tests Ordered: Current medicines are reviewed at length with the patient today.  Concerns regarding medicines are outlined above.  No orders of the defined types were placed in this encounter.  No orders of the defined types were placed in this encounter.   Chief Complaint  Patient presents with   Follow-up   coronary artery spasm    History of Present Illness:    Erin Black is a 72 y.o. female with a hx of MINOCA with ST elevation MI coronary artery calcification on CT scan with   normal coronary angiography 2015, frequent PVCs, orthostatic hypotension with syncope, dyslipidemia rheumatoid arthritis and MAC pulmonary infection and pulmonary nodules  last seen 08/06/2021.  Recently had a second opinion with pulmonary consultants felt to be doing well with her bronchiectasis and history of atypical mycobacterial infection treated with antibiotics.  Compliance with diet, lifestyle and medications: Yes  She has had a marked improvement in all of her life she is very vigorous active and exercises daily and no longer has shortness of breath cough  exercise intolerance edema palpitation or syncope She has had no chest pain She tolerates her statin without muscle pain or weakness She has been out of her beta-blocker and we decided she will take it in the future on a as needed basis  Recent labs 05/28/2022 cholesterol 156 LDL 70 hemoglobin 13.6 creatinine 0.76 potassium 4.0 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   Overview:  a follow-up echocardiogram which showed complete resolution of LV dysfunction consistent with Takotsubo syndrome    Bronchiectasis without complication    Chronic cough 06/20/2015   Congestive heart failure    DJD (degenerative joint disease)    Elevated IgE level 08/11/2016   Episode of syncope 10/31/2014   Essential hypertension    Hearing loss    History of MAI infection 02/21/2016   Hx of non-ST elevation myocardial infarction (NSTEMI) 04/11/2014   Hypotension, 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 ; Pos FOB cultures 04/11/2014   02/2014 CT Chest : RML and RLL nodular infiltrates probable inflammatory, Hx of RA on immunosuppression  ??MAI vs other pathogen 05/2014:  Fob Positive MAI  2/18/2016LFTs: normal Start date February 2016 with plan end date November 2016 of Myambutol, rifampin, azithromycin 3 times weekly 7/28/2016LFTs Normal     NSTEMI (non-ST elevated myocardial infarction) Nov 2015   Osteopenia    Other acute recurrent sinusitis 05/13/2016   Post-nasal drip 06/20/2015   Pulmonary emphysema 05/13/2016  PVC's (premature ventricular contractions)    RA (rheumatoid arthritis)    Rheumatoid arthritis involving multiple sites    Takotsubo cardiomyopathy    UTI (lower urinary tract infection)     Past Surgical History:  Procedure Laterality Date   bilateral tubal ligation     BRONCHIAL WASHINGS  08/29/2020   Procedure: BRONCHIAL  WASHINGS;  Surgeon: Kalman Shan, MD;  [facility];  Service: Endoscopy;;   CARDIAC CATHETERIZATION     LUMBAR SPINE SURGERY     strabismus repair     TONSILLECTOMY AND ADENOIDECTOMY     VIDEO BRONCHOSCOPY Bilateral 05/17/2014   Procedure: VIDEO BRONCHOSCOPY WITHOUT FLUORO;  Surgeon: Storm Frisk, MD;  Location: [facility];  Service: Cardiopulmonary;  Laterality: Bilateral;   VIDEO BRONCHOSCOPY N/A 08/29/2020   Procedure: VIDEO BRONCHOSCOPY WITHOUT FLUORO;  Surgeon: Kalman Shan, MD;  Location[facility];  Service: Endoscopy;  Laterality: N/A;    Current Medications: Current Meds  Medication Sig   aspirin 81 MG tablet Take 81 mg by mouth at bedtime. Every other day per pcp   atorvastatin (LIPITOR) 80 MG tablet Take 40 mg by mouth at bedtime.   clonazePAM (KLONOPIN) 0.5 MG tablet Take 0.5 mg by mouth daily.   EPINEPHrine 0.3 mg/0.3 mL IJ SOAJ injection Inject 0.3 mg into the muscle as needed.    ipratropium (ATROVENT) 0.06 % nasal spray Place 2 sprays into both nostrils in the morning and at bedtime.   nitroGLYCERIN (NITROSTAT) 0.4 MG SL tablet Place 0.4 mg under the tongue every 5 (five) minutes as needed for chest pain.     Allergies:   Bee venom, Omnicef [cefdinir], and Plavix [clopidogrel bisulfate]   Social History   Socioeconomic History   Marital status: Married    Spouse name: Not on file   Number of children: Not on file   Years of education: Not on file   Highest education level: Not on file  Occupational History   Occupation: Charity fundraiser    Employer: [employer]  Tobacco Use   Smoking status: Former    Packs/day: 1.00    Years: 25.00    Additional pack years: 0.00    Total pack years: 25.00    Types: Cigarettes    Quit date: 04/28/1998    Years since quitting: 24.3   Smokeless tobacco: Never  Vaping Use   Vaping Use: Never used  Substance and Sexual Activity   Alcohol use: Yes    Alcohol/week: 0.0 standard drinks of alcohol    Comment:  1 drink weekly   Drug use: No   Sexual activity: Not on file  Other Topics Concern   Not on file  Social History Narrative   Not on file   Social Determinants of Health   Financial Resource Strain: Not on file  Food Insecurity: Not on file  Transportation Needs: Not on file  Physical Activity: Not on file  Stress: Not on file  Social Connections: Not on file     Family History: The patient's family history includes Emphysema in her mother; Heart disease in her mother; Lung cancer in her mother and sister; Prostate cancer in her father. ROS:   Please see the history of present illness.    All other systems reviewed and are negative.  EKGs/Labs/Other Studies Reviewed:    The following studies were reviewed today:      EKG:  EKG ordered today and personally reviewed.  The ekg ordered today demonstrates sinus rhythm and is normal including QT  interval   Physical Exam:    VS:  BP 96/60 (BP Location: Left Arm, Patient Position: Sitting, Cuff Size: Small)   Pulse 68   Ht  (1.626 m)   Wt 91 lb 12.8 oz (41.6 kg)   SpO2 99%   BMI 15.76 kg/m     Wt Readings from Last 3 Encounters:  08/21/22 91 lb 12.8 oz (41.6 kg)  08/06/21 93 lb 3.2 oz (42.3 kg)  09/17/20 85 lb 6.4 oz (38.7 kg)     GEN:  Well nourished, well developed in no acute distress HEENT: Normal NECK: No JVD; No carotid bruits LYMPHATICS: No lymphadenopathy CARDIAC: RRR, no murmurs, rubs, gallops RESPIRATORY:  Clear to auscultation without rales, wheezing or rhonchi  ABDOMEN: Soft, non-tender, non-distended MUSCULOSKELETAL:  No edema; No deformity  SKIN: Warm and dry NEUROLOGIC:  Alert and oriented x 3 PSYCHIATRIC:  Normal affect    Signed, Norman Herrlich, MD  08/21/2022 12:05 PM    [facility]

## 2022-08-21 ENCOUNTER — Ambulatory Visit: Payer: PPO | Attending: Cardiology | Admitting: Cardiology

## 2022-08-21 ENCOUNTER — Encounter: Payer: Self-pay | Admitting: Cardiology

## 2022-08-21 VITALS — BP 96/60 | HR 68 | Ht 64.0 in | Wt 91.8 lb

## 2022-08-21 DIAGNOSIS — E782 Mixed hyperlipidemia: Secondary | ICD-10-CM

## 2022-08-21 DIAGNOSIS — I10 Essential (primary) hypertension: Secondary | ICD-10-CM | POA: Diagnosis not present

## 2022-08-21 DIAGNOSIS — I493 Ventricular premature depolarization: Secondary | ICD-10-CM | POA: Diagnosis not present

## 2022-08-21 DIAGNOSIS — I201 Angina pectoris with documented spasm: Secondary | ICD-10-CM | POA: Diagnosis not present

## 2022-08-21 DIAGNOSIS — I951 Orthostatic hypotension: Secondary | ICD-10-CM

## 2022-08-21 MED ORDER — METOPROLOL TARTRATE 25 MG PO TABS: 12.5000 mg | ORAL_TABLET | Freq: Every day | ORAL | 2 refills | Status: DC | PRN

## 2022-08-21 NOTE — Patient Instructions (Signed)
Medication Instructions:  Your physician has recommended you make the following change in your medication:   START: Lopressor 12.5 mg daily as needed  *If you need a refill on your cardiac medications before your next appointment, please call your pharmacy*   Lab Work: None If you have labs (blood work) drawn today and your tests are completely normal, you will receive your results only by: MyChart Message (if you have MyChart) OR A paper copy in the mail If you have any lab test that is abnormal or we need to change your treatment, we will call you to review the results.   Testing/Procedures: None   Follow-Up: At [facility], you and your health needs are our priority.  As part of our continuing mission to provide you with exceptional heart care, we have created designated Provider Care Teams.  These Care Teams include your primary Cardiologist (physician) and Advanced Practice Providers (APPs -  Physician Assistants and Nurse Practitioners) who all work together to provide you with the care you need, when you need it.  We recommend signing up for the patient portal called "MyChart".  Sign up information is provided on this After Visit Summary.  MyChart is used to connect with patients for Virtual Visits (Telemedicine).  Patients are able to view lab/test results, encounter notes, upcoming appointments, etc.  Non-urgent messages can be sent to your provider as well.   To learn more about what you can do with MyChart, go to [email or web address].    Your next appointment:   1 year(s)  Provider:   Norman Herrlich, MD    Other Instructions None

## 2023-01-08 DIAGNOSIS — J479 Bronchiectasis, uncomplicated: Secondary | ICD-10-CM | POA: Diagnosis not present

## 2023-01-26 IMAGING — CT CT CHEST W/O CM
1 series · 15 of 34 positions shown, 19 images · non-contrast
Comparison: 01/03/2020, 12/20/2018

CLINICAL DATA: Lung nodule

EXAM:
CT CHEST WITHOUT CONTRAST
TECHNIQUE: Multidetector CT imaging of the chest was performed following the
standard protocol without IV contrast.

[Series 2: chest w/(date) · axial · 0.60mm/px · z∈[-231,+15]mm · 15 of 145 slices shown, 19 images]
[im 11/145  mediastinal]
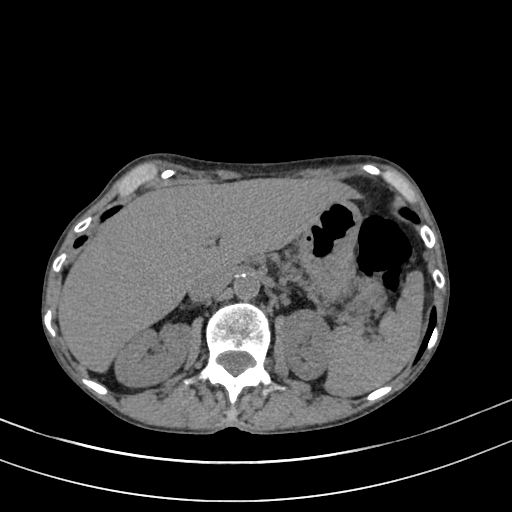
[im 11/145  lung]
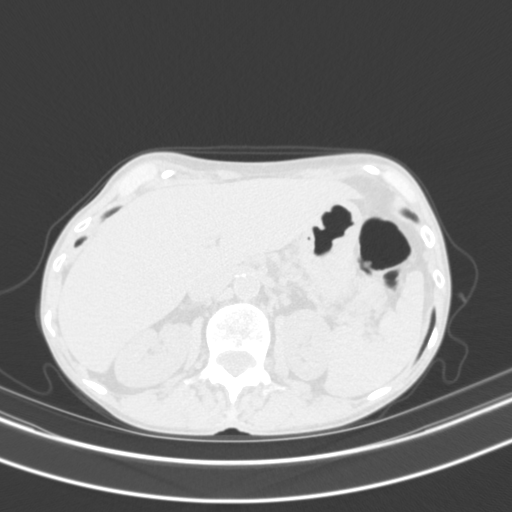
[im 22/145  lung]
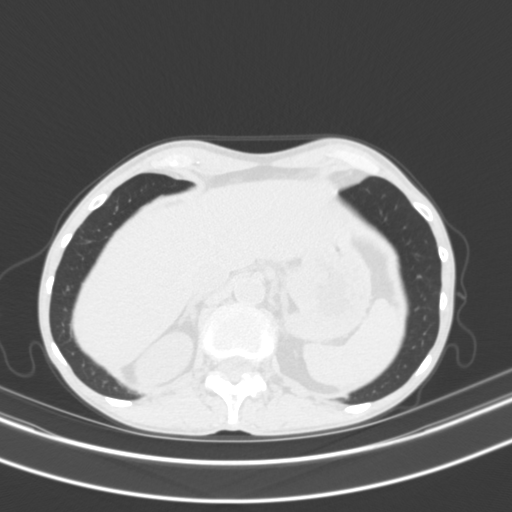
[im 29/145  lung]
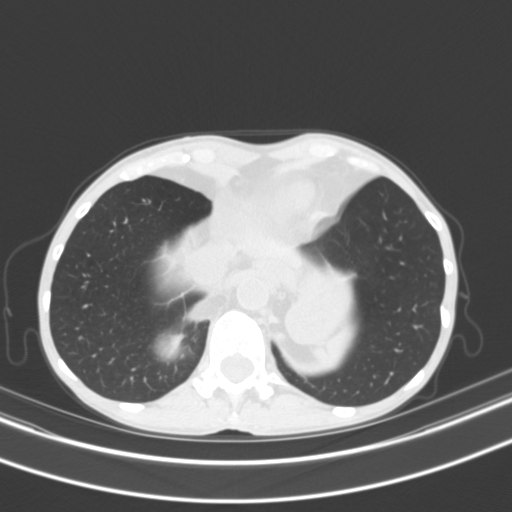
[im 38/145  lung]
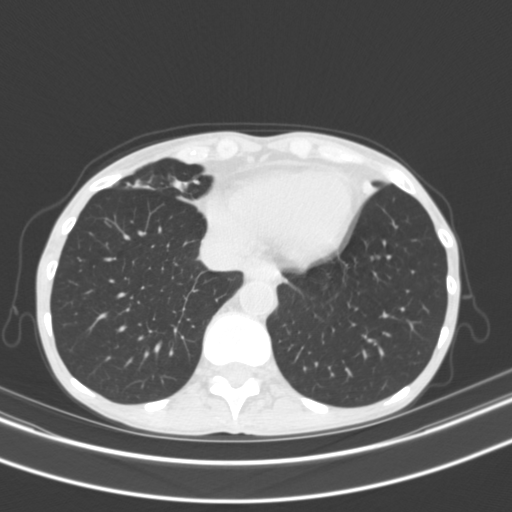
[im 49/145  mediastinal]
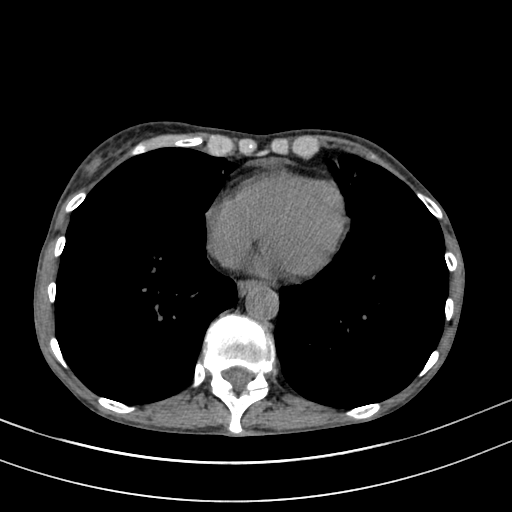
[im 49/145  lung]
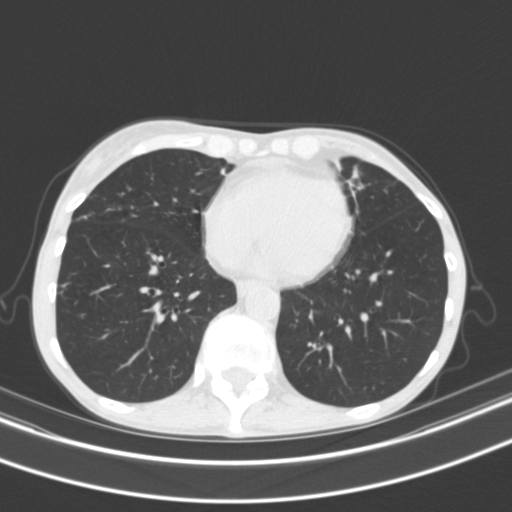
[im 58/145  lung]
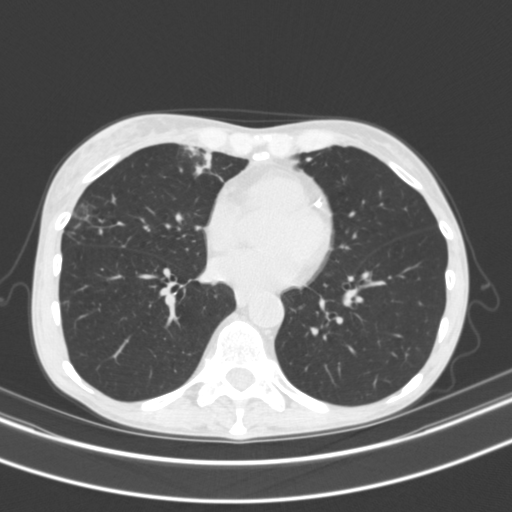
[im 65/145  lung]
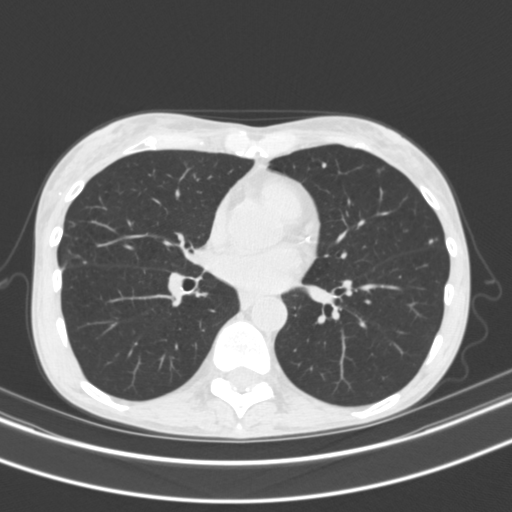
[im 75/145  lung]
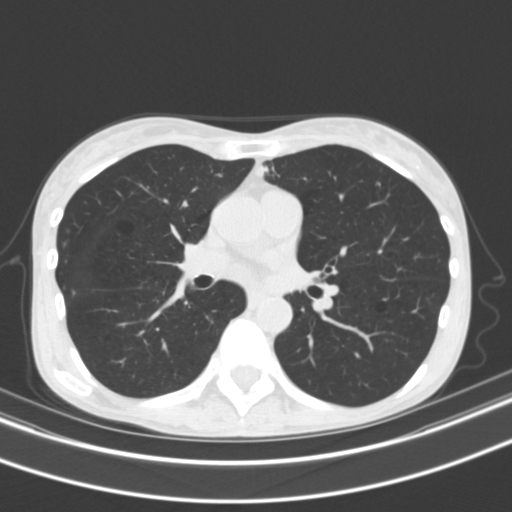
[im 81/145  mediastinal]
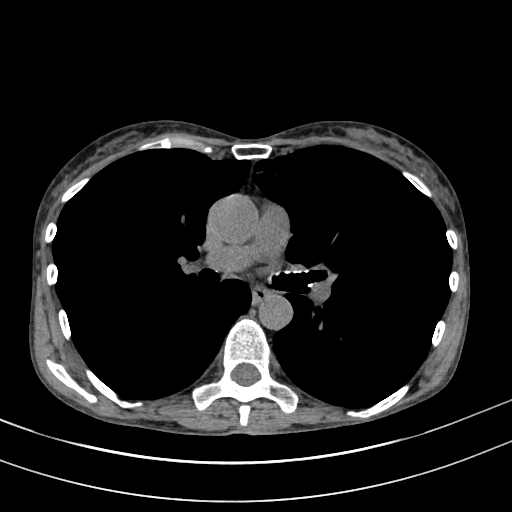
[im 81/145  lung]
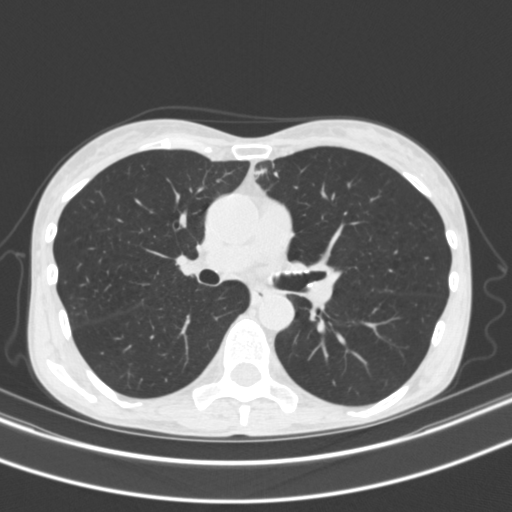
[im 87/145  lung]
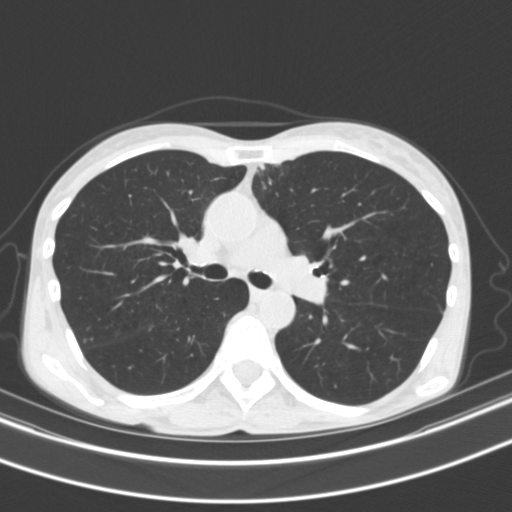
[im 97/145  lung]
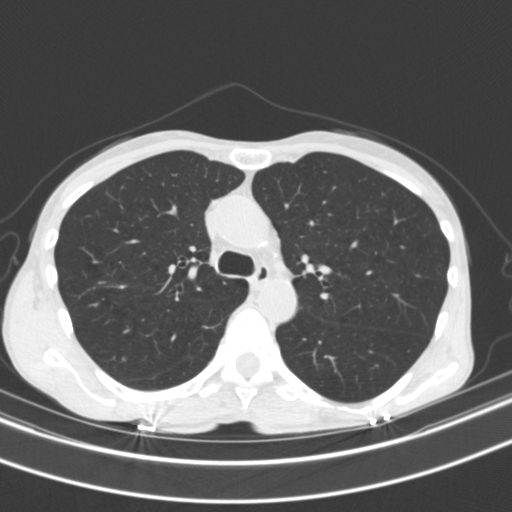
[im 107/145  lung]
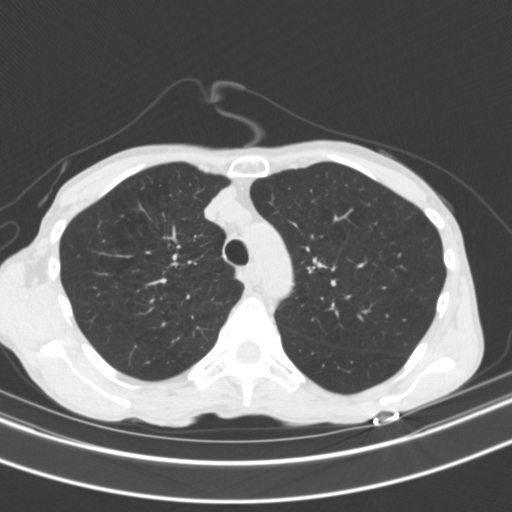
[im 116/145  mediastinal]
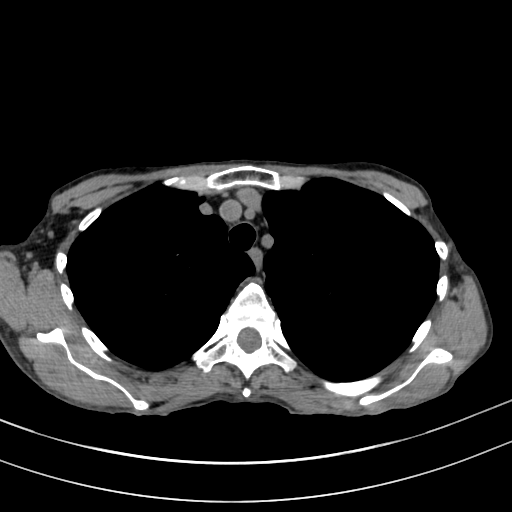
[im 116/145  lung]
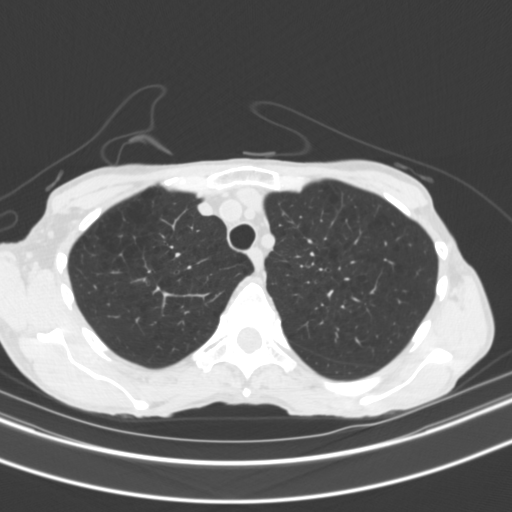
[im 123/145  lung]
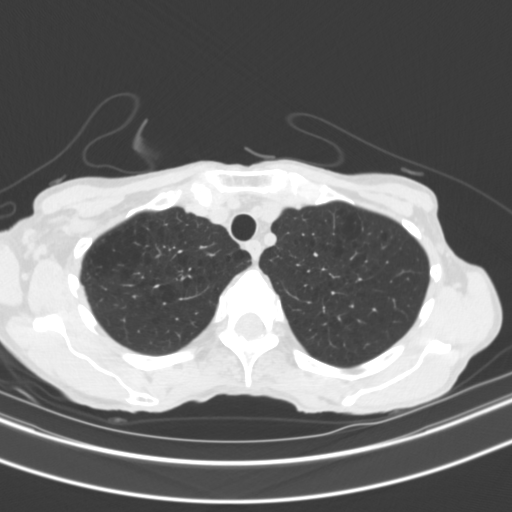
[im 134/145  lung]
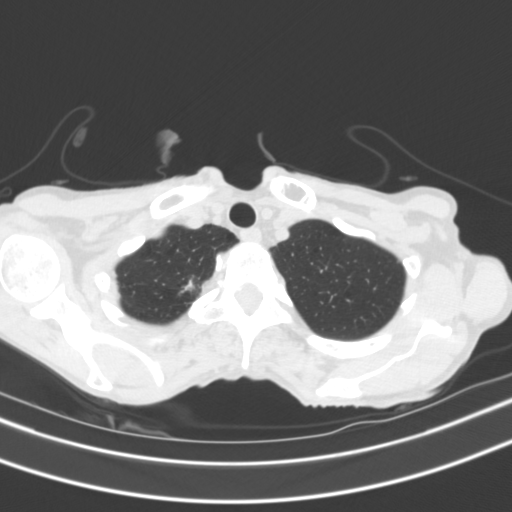

[15 of 34 positions shown; findings below may reference images not displayed]

FINDINGS: Cardiovascular: Aortic atherosclerosis. Normal heart size. Left
coronary artery calcifications. No pericardial effusion.

Mediastinum/Nodes: No enlarged mediastinal, hilar, or axillary lymph
nodes. Thyroid gland, trachea, and esophagus demonstrate no
significant findings.

Lungs/Pleura: Mild-to-moderate centrilobular emphysema. Multiple
scattered bilateral areas of clustered and tree-in-bud centrilobular
nodularity are again seen. Several areas of nodularity are new or
worsened compared to prior examination, for example in the medial
left upper lobe (series 5, image 61) and in both the medial and
lateral segments of the right middle lobe (series 5, image 87).
There is fibrotic scarring of the lingula (series 5, image 102). No
pleural effusion or pneumothorax.

Upper Abdomen: No acute abnormality.

Musculoskeletal: No chest wall mass or suspicious bone lesions
identified.
IMPRESSION: 1. Multiple scattered bilateral areas of clustered and tree-in-bud
centrilobular nodularity are again seen. Several areas of nodularity
are new or worsened compared to prior examination, for example in
the medial left upper lobe and in both the medial and lateral
segments of the right middle lobe. Findings are consistent with
ongoing, worsened atypical infection, particularly atypical
Mycobacterium.
2. Emphysema.
3. Coronary artery disease.

Aortic Atherosclerosis (IH42Y-0AX.X) and Emphysema (IH42Y-TLI.1).

## 2023-02-09 DIAGNOSIS — I10 Essential (primary) hypertension: Secondary | ICD-10-CM | POA: Diagnosis not present

## 2023-02-09 DIAGNOSIS — T783XXA Angioneurotic edema, initial encounter: Secondary | ICD-10-CM | POA: Diagnosis not present

## 2023-02-09 DIAGNOSIS — T782XXA Anaphylactic shock, unspecified, initial encounter: Secondary | ICD-10-CM | POA: Diagnosis not present

## 2023-02-09 DIAGNOSIS — J90 Pleural effusion, not elsewhere classified: Secondary | ICD-10-CM | POA: Diagnosis not present

## 2023-02-09 DIAGNOSIS — Z91030 Bee allergy status: Secondary | ICD-10-CM | POA: Diagnosis not present

## 2023-02-09 DIAGNOSIS — Z9911 Dependence on respirator [ventilator] status: Secondary | ICD-10-CM | POA: Diagnosis not present

## 2023-02-09 DIAGNOSIS — F32A Depression, unspecified: Secondary | ICD-10-CM | POA: Diagnosis not present

## 2023-02-09 DIAGNOSIS — T63441A Toxic effect of venom of bees, accidental (unintentional), initial encounter: Secondary | ICD-10-CM | POA: Diagnosis not present

## 2023-02-09 DIAGNOSIS — Z87891 Personal history of nicotine dependence: Secondary | ICD-10-CM | POA: Diagnosis not present

## 2023-02-09 DIAGNOSIS — T63484A Toxic effect of venom of other arthropod, undetermined, initial encounter: Secondary | ICD-10-CM | POA: Diagnosis not present

## 2023-02-09 DIAGNOSIS — R000 Tachycardia, unspecified: Secondary | ICD-10-CM | POA: Diagnosis not present

## 2023-02-09 DIAGNOSIS — E876 Hypokalemia: Secondary | ICD-10-CM | POA: Diagnosis not present

## 2023-02-09 DIAGNOSIS — E872 Acidosis, unspecified: Secondary | ICD-10-CM | POA: Diagnosis not present

## 2023-02-09 DIAGNOSIS — Z888 Allergy status to other drugs, medicaments and biological substances status: Secondary | ICD-10-CM | POA: Diagnosis not present

## 2023-02-09 DIAGNOSIS — J690 Pneumonitis due to inhalation of food and vomit: Secondary | ICD-10-CM | POA: Diagnosis not present

## 2023-02-09 DIAGNOSIS — J9690 Respiratory failure, unspecified, unspecified whether with hypoxia or hypercapnia: Secondary | ICD-10-CM | POA: Diagnosis not present

## 2023-02-09 DIAGNOSIS — Z7982 Long term (current) use of aspirin: Secondary | ICD-10-CM | POA: Diagnosis not present

## 2023-02-09 DIAGNOSIS — Z4682 Encounter for fitting and adjustment of non-vascular catheter: Secondary | ICD-10-CM | POA: Diagnosis not present

## 2023-02-09 DIAGNOSIS — R61 Generalized hyperhidrosis: Secondary | ICD-10-CM | POA: Diagnosis not present

## 2023-02-09 DIAGNOSIS — J9600 Acute respiratory failure, unspecified whether with hypoxia or hypercapnia: Secondary | ICD-10-CM | POA: Diagnosis not present

## 2023-02-09 DIAGNOSIS — R9431 Abnormal electrocardiogram [ECG] [EKG]: Secondary | ICD-10-CM | POA: Diagnosis not present

## 2023-02-09 DIAGNOSIS — I959 Hypotension, unspecified: Secondary | ICD-10-CM | POA: Diagnosis not present

## 2023-02-09 DIAGNOSIS — R079 Chest pain, unspecified: Secondary | ICD-10-CM | POA: Diagnosis not present

## 2023-02-09 DIAGNOSIS — I444 Left anterior fascicular block: Secondary | ICD-10-CM | POA: Diagnosis not present

## 2023-02-09 DIAGNOSIS — F419 Anxiety disorder, unspecified: Secondary | ICD-10-CM | POA: Diagnosis not present

## 2023-02-09 DIAGNOSIS — R918 Other nonspecific abnormal finding of lung field: Secondary | ICD-10-CM | POA: Diagnosis not present

## 2023-02-09 DIAGNOSIS — Z681 Body mass index (BMI) 19 or less, adult: Secondary | ICD-10-CM | POA: Diagnosis not present

## 2023-02-09 DIAGNOSIS — J439 Emphysema, unspecified: Secondary | ICD-10-CM | POA: Diagnosis not present

## 2023-02-09 DIAGNOSIS — Z79899 Other long term (current) drug therapy: Secondary | ICD-10-CM | POA: Diagnosis not present

## 2023-02-09 DIAGNOSIS — M810 Age-related osteoporosis without current pathological fracture: Secondary | ICD-10-CM | POA: Diagnosis not present

## 2023-02-09 DIAGNOSIS — M069 Rheumatoid arthritis, unspecified: Secondary | ICD-10-CM | POA: Diagnosis not present

## 2023-02-09 DIAGNOSIS — J984 Other disorders of lung: Secondary | ICD-10-CM | POA: Diagnosis not present

## 2023-02-09 DIAGNOSIS — T63461A Toxic effect of venom of wasps, accidental (unintentional), initial encounter: Secondary | ICD-10-CM | POA: Diagnosis not present

## 2023-02-09 DIAGNOSIS — R0600 Dyspnea, unspecified: Secondary | ICD-10-CM | POA: Diagnosis not present

## 2023-02-09 DIAGNOSIS — I252 Old myocardial infarction: Secondary | ICD-10-CM | POA: Diagnosis not present

## 2023-02-09 DIAGNOSIS — R636 Underweight: Secondary | ICD-10-CM | POA: Diagnosis not present

## 2023-02-09 DIAGNOSIS — Z0289 Encounter for other administrative examinations: Secondary | ICD-10-CM | POA: Diagnosis not present

## 2023-02-09 DIAGNOSIS — Z79631 Long term (current) use of antimetabolite agent: Secondary | ICD-10-CM | POA: Diagnosis not present

## 2023-02-17 DIAGNOSIS — R21 Rash and other nonspecific skin eruption: Secondary | ICD-10-CM | POA: Diagnosis not present

## 2023-02-17 DIAGNOSIS — Z681 Body mass index (BMI) 19 or less, adult: Secondary | ICD-10-CM | POA: Diagnosis not present

## 2023-02-17 DIAGNOSIS — R509 Fever, unspecified: Secondary | ICD-10-CM | POA: Diagnosis not present

## 2023-02-17 DIAGNOSIS — E876 Hypokalemia: Secondary | ICD-10-CM | POA: Diagnosis not present

## 2023-02-17 DIAGNOSIS — J189 Pneumonia, unspecified organism: Secondary | ICD-10-CM | POA: Diagnosis not present

## 2023-02-23 DIAGNOSIS — E876 Hypokalemia: Secondary | ICD-10-CM | POA: Diagnosis not present

## 2023-03-03 DIAGNOSIS — Z8701 Personal history of pneumonia (recurrent): Secondary | ICD-10-CM | POA: Diagnosis not present

## 2023-03-03 DIAGNOSIS — Z681 Body mass index (BMI) 19 or less, adult: Secondary | ICD-10-CM | POA: Diagnosis not present

## 2023-03-10 DIAGNOSIS — E876 Hypokalemia: Secondary | ICD-10-CM | POA: Diagnosis not present

## 2023-04-13 DIAGNOSIS — J449 Chronic obstructive pulmonary disease, unspecified: Secondary | ICD-10-CM | POA: Diagnosis not present

## 2023-04-13 DIAGNOSIS — R918 Other nonspecific abnormal finding of lung field: Secondary | ICD-10-CM | POA: Diagnosis not present

## 2023-04-13 DIAGNOSIS — Z8701 Personal history of pneumonia (recurrent): Secondary | ICD-10-CM | POA: Diagnosis not present

## 2023-06-03 DIAGNOSIS — Z0000 Encounter for general adult medical examination without abnormal findings: Secondary | ICD-10-CM | POA: Diagnosis not present

## 2023-06-03 DIAGNOSIS — Z7189 Other specified counseling: Secondary | ICD-10-CM | POA: Diagnosis not present

## 2023-06-03 DIAGNOSIS — E785 Hyperlipidemia, unspecified: Secondary | ICD-10-CM | POA: Diagnosis not present

## 2023-06-03 DIAGNOSIS — Z79899 Other long term (current) drug therapy: Secondary | ICD-10-CM | POA: Diagnosis not present

## 2023-06-03 DIAGNOSIS — Z1331 Encounter for screening for depression: Secondary | ICD-10-CM | POA: Diagnosis not present

## 2023-06-03 DIAGNOSIS — Z8701 Personal history of pneumonia (recurrent): Secondary | ICD-10-CM | POA: Diagnosis not present

## 2023-06-03 DIAGNOSIS — F419 Anxiety disorder, unspecified: Secondary | ICD-10-CM | POA: Diagnosis not present

## 2023-06-15 DIAGNOSIS — J439 Emphysema, unspecified: Secondary | ICD-10-CM | POA: Diagnosis not present

## 2023-06-15 DIAGNOSIS — Z8701 Personal history of pneumonia (recurrent): Secondary | ICD-10-CM | POA: Diagnosis not present

## 2023-06-22 ENCOUNTER — Encounter: Payer: Self-pay | Admitting: Internal Medicine

## 2023-07-09 DIAGNOSIS — Z1231 Encounter for screening mammogram for malignant neoplasm of breast: Secondary | ICD-10-CM | POA: Diagnosis not present

## 2023-08-11 DIAGNOSIS — J439 Emphysema, unspecified: Secondary | ICD-10-CM | POA: Diagnosis not present

## 2023-08-11 DIAGNOSIS — J479 Bronchiectasis, uncomplicated: Secondary | ICD-10-CM | POA: Diagnosis not present

## 2023-08-11 DIAGNOSIS — I700 Atherosclerosis of aorta: Secondary | ICD-10-CM | POA: Diagnosis not present

## 2023-08-11 DIAGNOSIS — J984 Other disorders of lung: Secondary | ICD-10-CM | POA: Diagnosis not present

## 2023-08-11 DIAGNOSIS — R911 Solitary pulmonary nodule: Secondary | ICD-10-CM | POA: Diagnosis not present

## 2023-08-11 DIAGNOSIS — I2510 Atherosclerotic heart disease of native coronary artery without angina pectoris: Secondary | ICD-10-CM | POA: Diagnosis not present

## 2023-08-11 DIAGNOSIS — J432 Centrilobular emphysema: Secondary | ICD-10-CM | POA: Diagnosis not present

## 2023-08-20 DIAGNOSIS — A310 Pulmonary mycobacterial infection: Secondary | ICD-10-CM | POA: Diagnosis not present

## 2023-08-20 DIAGNOSIS — J479 Bronchiectasis, uncomplicated: Secondary | ICD-10-CM | POA: Diagnosis not present

## 2023-09-08 ENCOUNTER — Other Ambulatory Visit: Payer: Self-pay

## 2023-09-09 ENCOUNTER — Ambulatory Visit: Admitting: Cardiology

## 2023-09-09 ENCOUNTER — Ambulatory Visit

## 2023-09-09 VITALS — BP 104/66 | HR 79 | Ht 64.0 in | Wt 94.2 lb

## 2023-09-09 DIAGNOSIS — E785 Hyperlipidemia, unspecified: Secondary | ICD-10-CM | POA: Insufficient documentation

## 2023-09-09 DIAGNOSIS — E782 Mixed hyperlipidemia: Secondary | ICD-10-CM | POA: Diagnosis not present

## 2023-09-09 DIAGNOSIS — I5181 Takotsubo syndrome: Secondary | ICD-10-CM

## 2023-09-09 NOTE — Patient Instructions (Signed)
 Medication Instructions:  Your physician recommends that you continue on your current medications as directed. Please refer to the Current Medication list given to you today.  *If you need a refill on your cardiac medications before your next appointment, please call your pharmacy*   Lab Work: None Ordered If you have labs (blood work) drawn today and your tests are completely normal, you will receive your results only by: MyChart Message (if you have MyChart) OR A paper copy in the mail If you have any lab test that is abnormal or we need to change your treatment, we will call you to review the results.   Testing/Procedures: Echocardiogram An echocardiogram is a test that uses sound waves (ultrasound) to produce images of the heart. Images from an echocardiogram can provide important information about: Heart size and shape. The size and thickness and movement of your heart's walls. Heart muscle function and strength. Heart valve function or if you have stenosis. Stenosis is when the heart valves are too narrow. If blood is flowing backward through the heart valves (regurgitation). A tumor or infectious growth around the heart valves. Areas of heart muscle that are not working well because of poor blood flow or injury from a heart attack. Aneurysm detection. An aneurysm is a weak or damaged part of an artery wall. The wall bulges out from the normal force of blood pumping through the body. Tell a health care provider about: Any allergies you have. All medicines you are taking, including vitamins, herbs, eye drops, creams, and over-the-counter medicines. Any blood disorders you have. Any surgeries you have had. Any medical conditions you have. Whether you are pregnant or may be pregnant. What are the risks? Generally, this is a safe test. However, problems may occur, including an allergic reaction to dye (contrast) that may be used during the test. What happens before the test? No  specific preparation is needed. You may eat and drink normally. What happens during the test?  You will take off your clothes from the waist up and put on a hospital gown. Electrodes or electrocardiogram (ECG)patches may be placed on your chest. The electrodes or patches are then connected to a device that monitors your heart rate and rhythm. You will lie down on a table for an ultrasound exam. A gel will be applied to your chest to help sound waves pass through your skin. A handheld device, called a transducer, will be pressed against your chest and moved over your heart. The transducer produces sound waves that travel to your heart and bounce back (or "echo" back) to the transducer. These sound waves will be captured in real-time and changed into images of your heart that can be viewed on a video monitor. The images will be recorded on a computer and reviewed by your health care provider. You may be asked to change positions or hold your breath for a short time. This makes it easier to get different views or better views of your heart. In some cases, you may receive contrast through an IV in one of your veins. This can improve the quality of the pictures from your heart. The procedure may vary among health care providers and hospitals. What can I expect after the test? You may return to your normal, everyday life, including diet, activities, and medicines, unless your health care provider tells you not to do that. Follow these instructions at home: It is up to you to get the results of your test. Ask your health care provider,  or the department that is doing the test, when your results will be ready. Keep all follow-up visits. This is important. Summary An echocardiogram is a test that uses sound waves (ultrasound) to produce images of the heart. Images from an echocardiogram can provide important information about the size and shape of your heart, heart muscle function, heart valve function, and  other possible heart problems. You do not need to do anything to prepare before this test. You may eat and drink normally. After the echocardiogram is completed, you may return to your normal, everyday life, unless your health care provider tells you not to do that. This information is not intended to replace advice given to you by your health care provider. Make sure you discuss any questions you have with your health care provider. Document Revised: 12/26/2020 Document Reviewed: 12/06/2019 Elsevier Patient Education  2023 Elsevier Inc.        Follow-Up: At [facility], you and your health needs are our priority.  As part of our continuing mission to provide you with exceptional heart care, we have created designated Provider Care Teams.  These Care Teams include your primary Cardiologist (physician) and Advanced Practice Providers (APPs -  Physician Assistants and Nurse Practitioners) who all work together to provide you with the care you need, when you need it.  We recommend signing up for the patient portal called "MyChart".  Sign up information is provided on this After Visit Summary.  MyChart is used to connect with patients for Virtual Visits (Telemedicine).  Patients are able to view lab/test results, encounter notes, upcoming appointments, etc.  Non-urgent messages can be sent to your provider as well.   To learn more about what you can do with MyChart, go to [email or web address].    Your next appointment:    6 month follow up

## 2023-09-09 NOTE — Assessment & Plan Note (Signed)
 Continue with atorvastatin  40 mg once daily. Last lipid panel 05-28-2022 total cholesterol 156, HDL 69, LDL 70, triglycerides 55.

## 2023-09-09 NOTE — Assessment & Plan Note (Addendum)
 History of stress cardiomyopathy in November 2015, Coronary angiogram #2015 at [facility], report from this not available for my review at this time. Reportedly follow-up echocardiogram December 2015 at [facility] showed normalized LVEF. No further follow-up echocardiogram since then.  Remains in good functional status.  Euvolemic, compensated, NYHA class I Mild shortness of breath appears more related to her underlying pulmonary issues.  Will obtain a transthoracic echocardiogram for baseline cardiac structure and function assessment for any significant interval changes.  Unable to be on any guideline directed medical therapy over the years due to soft blood pressures and symptoms due to hypotension.  Potential risk for recurrence of stress cardiomyopathy in stressful situations discussed. However at this time given prior intolerance to medications and soft blood pressures we will hold off on initiating any guideline directed medical therapy.  Continue with 0.81 mg once daily.

## 2023-09-09 NOTE — Progress Notes (Signed)
 Cardiology Consultation:    Date:  09/09/2023   ID:  Erin Black, DOB 09-18-1950, MRN [id number]  PCP:  Olan Bering, MD  Cardiologist:  Daymon Evans Jaylun Fleener, MD   Referring MD: Olan Bering, MD   No chief complaint on file.    ASSESSMENT AND PLAN:   Erin Black 73 year old woman history of Takotsubo cardiomyopathy 2015 [presentation in 02/2014 to [facility] with abnormal EKG findings and echocardiogram findings, transferred to [facility] and underwent coronary angiogram that showed no obstructive disease; there is mention of a follow-up echocardiogram December 2015 at [facility] that showed complete resolution of LV dysfunction], frequent PVCs, syncope in the setting of orthostatic hypotension, dyslipidemia, rheumatoid arthritis, former smoker [quit in 2000], occasional alcohol  consumption 1 drink, 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 Doing well appears to be at her baseline, does have mild shortness of breath with exertion but continues to exercise regularly Problem List Items Addressed This Visit     Takotsubo cardiomyopathy - Primary   History of stress cardiomyopathy in November 2015, Coronary angiogram #2015 at [facility], report from this not available for my review at this time. Reportedly follow-up echocardiogram December 2015 at [facility] showed normalized LVEF. No further follow-up echocardiogram since then.  Remains in good functional status.  Euvolemic, compensated, NYHA class I Mild shortness of breath appears more related to her underlying pulmonary issues.  Will obtain a transthoracic echocardiogram for baseline cardiac structure and function assessment for any significant interval changes.  Unable to be on any guideline directed medical therapy over the years due to soft  blood pressures and symptoms due to hypotension.  Potential risk for recurrence of stress cardiomyopathy in stressful situations discussed. However at this time given prior intolerance to medications and soft blood pressures we will hold off on initiating any guideline directed medical therapy.  Continue with 0.81 mg once daily.      Relevant Orders   EKG 12-Lead (Completed)   ECHOCARDIOGRAM COMPLETE   Hyperlipidemia   Continue with atorvastatin  40 mg once daily. Last lipid panel 05-28-2022 total cholesterol 156, HDL 69, LDL 70, triglycerides 55.        Return to clinic tentatively in 6 months or as needed.    History of Present Illness:    Erin Black is a 73 y.o. female who is being seen today for follow-up visit. PCP is Olan Bering, MD. Last visit with our office was 08/21/2022 with Dr. Sandee Crook. Follows up with pulmonologist at [facility].  Pleasant woman here for the visit by herself.  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  Exercises regularly.  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  Here mentions today that she has been doing well.  Other than sense of shortness of breath which is attributed to her underlying pulmonary issues, things have been stable. She  continues  to exercise regularly at the gym. October 2024 she had an allergic reaction to yellow wasps and was intubated and on mechanical ventilation.  Otherwise overall she is doing well. Currently not on any blood pressure lowering medications given her prior history of orthostatic hypotension and syncopal episodes.  Quit smoking 25 years ago Occasional alcohol  consumption 1 drink.  EKG in the clinic today shows sinus rhythm heart rate 79/min, low voltage in limb leads.  QRS duration 82 ms.  QTc 447 ms.  Axis left axis deviation, no significant ST-T changes to suggest ischemia.  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   Deviated septum 05/28/2016   DJD (degenerative joint disease)    Elevated IgE level 08/11/2016   Episode of syncope 10/31/2014   Essential hypertension    Hearing loss    History of MAI infection 02/21/2016   History of rheumatoid arthritis 02/16/2017   Hx of non-ST elevation myocardial infarction (NSTEMI) 04/11/2014   Hypertrophy, nasal, turbinate 05/28/2016   Hypotension, 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 ; Pos FOB cultures 04/11/2014   02/2014 CT Chest : RML and RLL nodular infiltrates probable inflammatory, Hx of RA on immunosuppression  ??MAI vs other pathogen 05/2014:  Fob Positive MAI  2/18/2016LFTs: normal Start date February 2016 with plan end date November 2016 of Myambutol , rifampin , azithromycin  3 times weekly 7/28/2016LFTs Normal      NSTEMI (non-ST elevated myocardial infarction) (HCC) 02/2014   Osteopenia    Other acute recurrent sinusitis 05/13/2016   Perennial allergic rhinitis 05/28/2016   Post-nasal drip 06/20/2015   Pulmonary emphysema (HCC) 05/13/2016   Pulmonary infiltrate    PVC's (premature ventricular contractions)    RA (rheumatoid arthritis) (HCC)    Rheumatoid arthritis involving multiple sites (HCC)    RML pneumonia 08/30/2020   Takotsubo cardiomyopathy     Past Surgical History:  Procedure Laterality Date   bilateral tubal ligation     BRONCHIAL WASHINGS  08/29/2020   Procedure: BRONCHIAL WASHINGS;  Surgeon: Maire Scot, MD;  Location: [facility];  Service: Endoscopy;;   CARDIAC CATHETERIZATION     LUMBAR SPINE SURGERY     strabismus repair     TONSILLECTOMY AND ADENOIDECTOMY     VIDEO BRONCHOSCOPY Bilateral 05/17/2014   Procedure: VIDEO BRONCHOSCOPY WITHOUT FLUORO;  Surgeon: Vernell Goldsmith, MD;  Location: [facility];  Service: Cardiopulmonary;  Laterality: Bilateral;   VIDEO BRONCHOSCOPY N/A 08/29/2020   Procedure: VIDEO BRONCHOSCOPY WITHOUT FLUORO;  Surgeon: Maire Scot, MD;  Location: [facility];  Service: Endoscopy;  Laterality: N/A;    Current Medications: Current Meds  Medication Sig   aspirin 81 MG tablet Take 81 mg by mouth at bedtime.   atorvastatin  (LIPITOR) 80 MG tablet Take 40 mg by mouth at bedtime.   clonazePAM (KLONOPIN) 0.5 MG tablet Take 0.5 mg by mouth daily.   EPINEPHrine 0.3 mg/0.3 mL IJ SOAJ injection Inject 0.3 mg into the muscle as needed for anaphylaxis.   ipratropium (ATROVENT) 0.06 % nasal spray Place 2 sprays into both nostrils in the morning and at  bedtime.   nitroGLYCERIN (NITROSTAT) 0.4 MG SL tablet Place 0.4 mg under the tongue every 5 (five) minutes as needed for chest pain.     Allergies:   Bee venom, Omnicef [cefdinir], and Plavix [clopidogrel bisulfate]   Social History   Socioeconomic History   Marital status: Married    Spouse  name: Not on file   Number of children: Not on file   Years of education: Not on file   Highest education level: Not on file  Occupational History   Occupation: Teacher, adult education: [employer]  Tobacco Use   Smoking status: Former    Current packs/day: 0.00    Average packs/day: 1 pack/day for 25.0 years (25.0 ttl pk-yrs)    Types: Cigarettes    Start date: 04/28/1973    Quit date: 04/28/1998    Years since quitting: 25.3   Smokeless tobacco: Never  Vaping Use   Vaping status: Never Used  Substance and Sexual Activity   Alcohol  use: Yes    Alcohol /week: 0.0 standard drinks of alcohol     Comment: 1 drink weekly   Drug use: No   Sexual activity: Not on file  Other Topics Concern   Not on file  Social History Narrative   Not on file   Social Drivers of Health   Financial Resource Strain: Not on file  Food Insecurity: Not on file  Transportation Needs: Not on file  Physical Activity: Not on file  Stress: Not on file  Social Connections: Unknown (09/10/2021)   Received from [facility]   Social Network    Social Network: Not on file     Family History: The patient's family history includes Emphysema in her mother; Heart disease in her mother; Lung cancer in her mother and sister; Prostate cancer in her father. ROS:   Please see the history of present illness.    All 14 point review of systems negative except as described per history of present illness.  EKGs/Labs/Other Studies Reviewed:    The following studies were reviewed today:   EKG:  EKG Interpretation Date/Time:  Wednesday Sep 09 2023 11:20:20 EDT Ventricular Rate:  79 PR Interval:  114 QRS Duration:  82 QT Interval:  390 QTC Calculation: 447 R Axis:   -64  Text Interpretation: Normal sinus rhythm Left axis deviation Abnormal ECG When compared with ECG of 11-Apr-2014 14:22, PREVIOUS ECG IS PRESENT Confirmed by Bertha Broad reddy [phone]) on 09/09/2023 11:51:17 AM    Recent Labs: No  results found for requested labs within last 365 days.  Recent Lipid Panel No results found for: "CHOL", "TRIG", "HDL", "CHOLHDL", "VLDL", "LDLCALC", "LDLDIRECT"  Physical Exam:    VS:  BP 104/66   Pulse 79   Ht 5\' 4"  (1.626 m)   Wt 94 lb 3.2 oz (42.7 kg)   SpO2 97%   BMI 16.17 kg/m     Wt Readings from Last 3 Encounters:  09/09/23 94 lb 3.2 oz (42.7 kg)  08/21/22 91 lb 12.8 oz (41.6 kg)  08/06/21 93 lb 3.2 oz (42.3 kg)     GENERAL:  Well nourished, well developed in no acute distress NECK: No JVD; No carotid bruits CARDIAC: RRR, S1 and S2 present, no murmurs, no rubs, no gallops CHEST:  Clear to auscultation without rales, wheezing or rhonchi  Extremities: No pitting pedal edema. Pulses bilaterally symmetric with radial 2+ and dorsalis pedis 2+ NEUROLOGIC:  Alert and oriented x 3  Medication Adjustments/Labs and Tests Ordered: Current  medicines are reviewed at length with the patient today.  Concerns regarding medicines are outlined above.  Orders Placed This Encounter  Procedures   EKG 12-Lead   ECHOCARDIOGRAM COMPLETE   No orders of the defined types were placed in this encounter.   Signed, Gabriele Loveland reddy Easter Schinke, MD, MPH, [facility]. 09/09/2023 12:44 PM    [facility]

## 2023-10-07 ENCOUNTER — Ambulatory Visit

## 2023-10-07 DIAGNOSIS — I5181 Takotsubo syndrome: Secondary | ICD-10-CM | POA: Diagnosis not present

## 2023-10-29 DIAGNOSIS — A310 Pulmonary mycobacterial infection: Secondary | ICD-10-CM | POA: Diagnosis not present

## 2023-10-29 DIAGNOSIS — R918 Other nonspecific abnormal finding of lung field: Secondary | ICD-10-CM | POA: Diagnosis not present

## 2023-10-29 DIAGNOSIS — J432 Centrilobular emphysema: Secondary | ICD-10-CM | POA: Diagnosis not present

## 2023-10-29 DIAGNOSIS — I700 Atherosclerosis of aorta: Secondary | ICD-10-CM | POA: Diagnosis not present

## 2023-11-02 ENCOUNTER — Telehealth: Payer: Self-pay

## 2023-11-02 NOTE — Telephone Encounter (Signed)
 Pt called in for results for echo. Informed her they haven't been reviewed yet, nurse will call once recommendations are given.

## 2023-11-03 ENCOUNTER — Ambulatory Visit: Payer: Self-pay

## 2023-11-04 NOTE — Telephone Encounter (Signed)
 Results reviewed with pt as per Dr. Madireddy's note.  Pt verbalized understanding and had no additional questions. Routed to PCP

## 2023-11-12 DIAGNOSIS — R911 Solitary pulmonary nodule: Secondary | ICD-10-CM | POA: Diagnosis not present

## 2023-12-09 ENCOUNTER — Encounter: Payer: Self-pay | Admitting: Pharmacist

## 2023-12-09 NOTE — Progress Notes (Signed)
 Pharmacy Quality Measure Review  This patient is appearing on a report for being at risk of failing the adherence measure for cholesterol (statin) medications this calendar year.   Medication: atorvastatin  80 mg Take 1 1/2 tablet PO daily  Last fill date: 11/23/2023 for 90 day supply (#45 tablets)   Insurance report was not up to date. No action needed at this time.  Consider writing atorvastatin  40 mg 1 tablet daily #90 in future as there is a gap in fill history prior to recent fill.   Annabella Galla, PharmD Clinical Pharmacist [place] Direct Dial: [phone]

## 2024-03-03 DIAGNOSIS — R918 Other nonspecific abnormal finding of lung field: Secondary | ICD-10-CM | POA: Diagnosis not present

## 2024-03-03 DIAGNOSIS — R911 Solitary pulmonary nodule: Secondary | ICD-10-CM | POA: Diagnosis not present

## 2024-03-07 DIAGNOSIS — Z2239 Carrier of other specified bacterial diseases: Secondary | ICD-10-CM | POA: Insufficient documentation

## 2024-03-08 NOTE — Progress Notes (Unsigned)
 Cardiology Office Note:    Date:  03/09/2024   ID:  JOHANA HOPKINSON, DOB 1951/04/06, MRN [id number]  PCP:  Jefferey Fitch, MD  Cardiologist:  Redell Leiter, MD    Referring MD: Jefferey Fitch, MD    ASSESSMENT:    1. Coronary vasospasm   2. Hypotension, postural   3. Frequent PVCs   4. Essential hypertension   5. Dyslipidemia    PLAN:    In order of problems listed above:  She continues to do well long-term no recurrent clinical coronary spasms continue her medical regimen including aspirin and statin labs followed with her PCP Hypotension is improved no clinical episodes Not on any antihypertensive therapy Will continue her statin   Next appointment: 1 year   Medication Adjustments/Labs and Tests Ordered: Current medicines are reviewed at length with the patient today.  Concerns regarding medicines are outlined above.  Orders Placed This Encounter  Procedures   EKG 12-Lead   No orders of the defined types were placed in this encounter.    History of Present Illness:    TEOSHA CASSO is a 73 y.o. female with a hx of what [place] with normal coronary arteriography frequent PVCs orthostatic hypotension with syncope dyslipidemia rheumatoid arthritis and MAC pulmonary infection last seen 09/09/2023 by my partner and last seen by me 08/21/2022.  She had an echocardiogram performed 10/07/2023 left ventricle normal size systolic function EF 60 to 65% GLS was borderline -16.5%.  Compliance with diet, lifestyle and medications: Yes  Unfortunately a year ago she had a bout of anaphylaxis from bee sting.  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   Overview:  a follow-up echocardiogram which showed complete resolution of LV dysfunction consistent with Takotsubo syndrome    Bronchiectasis without complication (HCC)    Cardiomyopathy, secondary (HCC) 12/11/2016   Chronic cough 06/20/2015   Chronic sinusitis 05/28/2016   Congestive heart failure (HCC)    Coronary vasospasm 05/07/2018   Deviated septum 05/28/2016   DJD (degenerative joint disease)    Elevated IgE level 08/11/2016   Episode of syncope 10/31/2014   Essential hypertension    Hearing loss    History of MAI infection 02/21/2016   History of rheumatoid arthritis 02/16/2017   Hx of non-ST elevation myocardial infarction (NSTEMI) 04/11/2014   Hypertrophy, nasal, turbinate 05/28/2016   Hypotension, 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 ; Pos FOB cultures 04/11/2014   02/2014 CT Chest : RML and RLL nodular infiltrates probable inflammatory, Hx of RA on immunosuppression  ??MAI vs other pathogen 05/2014:  Fob Positive MAI  2/18/2016LFTs: normal Start date February 2016 with plan end date November 2016 of Myambutol , rifampin , azithromycin  3 times weekly 7/28/2016LFTs Normal     NSTEMI (non-ST elevated myocardial infarction) (HCC) 02/2014   Osteopenia    Other acute recurrent sinusitis 05/13/2016   Perennial allergic rhinitis 05/28/2016   Post-nasal drip  06/20/2015   Pulmonary emphysema (HCC) 05/13/2016   Pulmonary infiltrate    PVC's (premature ventricular contractions)    RA (rheumatoid arthritis) (HCC)    Rheumatoid arthritis involving multiple sites (HCC)    RML pneumonia 08/30/2020   Takotsubo cardiomyopathy     Current Medications: Current Meds  Medication Sig    aspirin 81 MG tablet Take 81 mg by mouth at bedtime.   atorvastatin  (LIPITOR) 80 MG tablet Take 40 mg by mouth at bedtime.   clonazePAM (KLONOPIN) 0.5 MG tablet Take 0.5 mg by mouth daily.   EPINEPHrine 0.3 mg/0.3 mL IJ SOAJ injection Inject 0.3 mg into the muscle as needed for anaphylaxis.   ipratropium (ATROVENT) 0.06 % nasal spray Place 2 sprays into both nostrils in the morning and at bedtime.   nitroGLYCERIN (NITROSTAT) 0.4 MG SL tablet Place 0.4 mg under the tongue every 5 (five) minutes as needed for chest pain.      EKGs/Labs/Other Studies Reviewed:    The following studies were reviewed today:   EKG Interpretation Date/Time:  Wednesday March 09 2024 10:13:58 EST Ventricular Rate:  81 PR Interval:  114 QRS Duration:  84 QT Interval:  386 QTC Calculation: 448 R Axis:   2  Text Interpretation: Normal sinus rhythm Normal ECG When compared with ECG of 09-Sep-2023 11:20, QRS axis Shifted right Confirmed by Monetta Rogue ([id number]) on 03/09/2024 10:18:14 AM     Recent Labs: Labs her follow-up with her PCP lipid profile January 24 cholesterol 156 LDL 70 hemoglobin 13.6 creatinine 0.76  Physical Exam:    VS:  BP 92/60   Pulse 81   Ht 5' 4 (1.626 m)   Wt 98 lb 3.2 oz (44.5 kg)   SpO2 97%   BMI 16.86 kg/m     Wt Readings from Last 3 Encounters:  03/09/24 98 lb 3.2 oz (44.5 kg)  09/09/23 94 lb 3.2 oz (42.7 kg)  08/21/22 91 lb 12.8 oz (41.6 kg)     GEN:  Well nourished, well developed in no acute distress quite petite small stature very low BMI HEENT: Normal NECK: No JVD; No carotid bruits LYMPHATICS: No lymphadenopathy CARDIAC: RRR, no murmurs, rubs, gallops RESPIRATORY:  Clear to auscultation without rales, wheezing or rhonchi  ABDOMEN: Soft, non-tender, non-distended MUSCULOSKELETAL:  No edema; No deformity  SKIN: Warm and dry NEUROLOGIC:  Alert and oriented x 3 PSYCHIATRIC:  Normal affect    Signed, Rogue Monetta, MD  03/09/2024 10:34 AM    [facility]

## 2024-03-09 ENCOUNTER — Ambulatory Visit: Attending: Cardiology | Admitting: Cardiology

## 2024-03-09 ENCOUNTER — Encounter: Payer: Self-pay | Admitting: Cardiology

## 2024-03-09 VITALS — BP 92/60 | HR 81 | Ht 64.0 in | Wt 98.2 lb

## 2024-03-09 DIAGNOSIS — I493 Ventricular premature depolarization: Secondary | ICD-10-CM

## 2024-03-09 DIAGNOSIS — I201 Angina pectoris with documented spasm: Secondary | ICD-10-CM

## 2024-03-09 DIAGNOSIS — I951 Orthostatic hypotension: Secondary | ICD-10-CM

## 2024-03-09 DIAGNOSIS — I10 Essential (primary) hypertension: Secondary | ICD-10-CM

## 2024-03-09 DIAGNOSIS — E785 Hyperlipidemia, unspecified: Secondary | ICD-10-CM

## 2024-03-09 NOTE — Patient Instructions (Signed)

## 2024-03-22 DIAGNOSIS — R911 Solitary pulmonary nodule: Secondary | ICD-10-CM | POA: Diagnosis not present
